# Patient Record
Sex: Male | Born: 1935
Health system: Southern US, Community
[De-identification: ages and names within clinical notes are randomized; demographics above are authoritative.]

## PROBLEM LIST (undated history)

## (undated) DIAGNOSIS — C439 Malignant melanoma of skin, unspecified: Secondary | ICD-10-CM

## (undated) DIAGNOSIS — C4491 Basal cell carcinoma of skin, unspecified: Secondary | ICD-10-CM

## (undated) DIAGNOSIS — R0602 Shortness of breath: Secondary | ICD-10-CM

## (undated) DIAGNOSIS — I10 Essential (primary) hypertension: Secondary | ICD-10-CM

## (undated) DIAGNOSIS — I2510 Atherosclerotic heart disease of native coronary artery without angina pectoris: Secondary | ICD-10-CM

## (undated) DIAGNOSIS — E785 Hyperlipidemia, unspecified: Secondary | ICD-10-CM

## (undated) HISTORY — DX: Atherosclerotic heart disease of native coronary artery without angina pectoris: I25.10

## (undated) HISTORY — DX: Shortness of breath: R06.02

## (undated) HISTORY — PX: CORONARY ARTERY BYPASS GRAFT: SHX141

## (undated) HISTORY — PX: APPENDECTOMY: SHX54

## (undated) HISTORY — DX: Essential (primary) hypertension: I10

## (undated) HISTORY — PX: KIDNEY STONE SURGERY: SHX686

## (undated) HISTORY — PX: REFRACTIVE SURGERY: SHX103

## (undated) HISTORY — DX: Hyperlipidemia, unspecified: E78.5

## (undated) NOTE — *Deleted (*Deleted)
Evaluation Performed:  Follow-up visit  Date:  08/19/2020   ID:  [name], DOB September 02, 1929, MRN [id number]  Provider Location: Office  PCP:  Lupita Raider, MD  Cardiologist:  No primary care provider on file. Damere Brandenburg Electrophysiologist:  None   Chief Complaint:  CAD/CABG  History of Present Illness:    HPI: 40 y.o. CAD/ CABG in 1999 last myovue 2018 no ischemia. Seen by PA in July 2019  for "syncope" thought to be vasovagal Holter done 05/24/18 no significant arrhythmia EF 48% on myovue June 2018  His syncope episodes are precipitated by pain in his jaw/teeth when Eating Denies TMJ or chronic dental issues.   No angina Unchanged chronic mild exertional dyspnea   Daughter checks in on him weekly and there is a cafe in [place] he can pick up food  Noted more edema and some weakness in left leg    ***   Past Medical History:  Diagnosis Date  . Basal cell carcinoma 06/02/2003   left bulb [id number]  . CAD   . HYPERLIPIDEMIA   . HYPERTENSION   . Melanoma (HCC) 06/05/2003   left cheek-MOHS  . Shortness of breath    Past Surgical History:  Procedure Laterality Date  . APPENDECTOMY    . CORONARY ARTERY BYPASS GRAFT    . KIDNEY STONE SURGERY    . REFRACTIVE SURGERY       No outpatient medications have been marked as taking for the 08/29/20 encounter (Appointment) with Wendall Stade, MD.     Allergies:   Succinylsulphathiazole and Sulfonamide derivatives   Social History   Tobacco Use  . Smoking status: Former Smoker    Packs/day: 2.00    Years: 40.00    Pack years: 80.00    Types: Cigarettes  . Smokeless tobacco: Never Used  Vaping Use  . Vaping Use: Never used  Substance Use Topics  . Alcohol use: No    Alcohol/week: 0.0 standard drinks    Comment: 0.5 glass of wine nightly.  . Drug use: No     Family Hx: The patient's family history includes Anuerysm in his father; Coronary artery disease in his father; Healthy in his daughter; Heart  attack in his brother.  ROS:   Please see the history of present illness.     All other systems reviewed and are negative.   Prior CV studies:   The following studies were reviewed today:  Holter 05/24/18 Myovue 05/04/17 Echo 11/18/11  Labs/Other Tests and Data Reviewed:    EKG:  SR LAD PVC nonspecific ST changes 05/24/18 10/30/19 SR rate 85 poor R wave progression   Recent Labs: No results found for requested labs within last 8760 hours.   Recent Lipid Panel Lab Results  Component Value Date/Time   CHOL 160 05/27/2010 12:00 PM   TRIG 96.0 05/27/2010 12:00 PM   HDL 47.30 05/27/2010 12:00 PM   CHOLHDL 3 05/27/2010 12:00 PM   LDLCALC 94 05/27/2010 12:00 PM    Wt Readings from Last 3 Encounters:  10/30/19 184 lb 3.2 oz (83.6 kg)  02/21/19 185 lb (83.9 kg)  06/15/18 178 lb 4 oz (80.9 kg)     Objective:    Vital Signs:  There were no vitals taken for this visit.   Affect appropriate Elderly kyphotic white male  HEENT: normal Neck supple with no adenopathy JVP normal no bruits no thyromegaly Lungs clear with no wheezing and good diaphragmatic motion Heart:  S1/S2 no murmur, no rub, gallop or  click PMI normal Abdomen: benighn, BS positve, no tenderness, no AAA no bruit.  No HSM or HJR Distal pulses intact with no bruits *** edema Neuro non-focal Skin warm and dry No muscular weakness   ASSESSMENT & PLAN:    ASSESSMENT AND PLAN:   1. Syncope: based on history surrounding event, suspect likely vasovagal syncope.  Holter 05/24/18 no significant arrhythmia Carotids 02/14/20 no significant stenosis   2. CAD: h/o CABG in 1999.  Myovue  04/2017 showed normal EF and no ischemia. He denies any CP and no dyspnea. Continue medical therapy.   3. Edema:  ***   COVID-19 Education: The signs and symptoms of COVID-19 were discussed with the patient and how to seek care for testing (follow up with PCP or arrange E-visit).  The importance of social distancing was discussed  today.  Time:   Today, I have spent 30 minutes with the patient     Medication Adjustments/Labs and Tests Ordered: Current medicines are reviewed at length with the patient today.  Concerns regarding medicines are outlined above.  Tests Ordered: No orders of the defined types were placed in this encounter.  Medication Changes: No orders of the defined types were placed in this encounter.   Disposition:  Follow up in 6 months  Signed, Charlton Haws, MD  08/19/2020 5:16 PM    [facility]

---
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
## 1998-07-24 ENCOUNTER — Inpatient Hospital Stay (HOSPITAL_COMMUNITY): Admission: AD | Admit: 1998-07-24 | Discharge: 1998-07-30 | Payer: Self-pay | Admitting: Cardiovascular Disease

## 1998-07-25 ENCOUNTER — Encounter: Payer: Self-pay | Admitting: Thoracic Surgery (Cardiothoracic Vascular Surgery)

## 1998-07-26 ENCOUNTER — Encounter: Payer: Self-pay | Admitting: Thoracic Surgery (Cardiothoracic Vascular Surgery)

## 1998-07-27 ENCOUNTER — Encounter: Payer: Self-pay | Admitting: Thoracic Surgery (Cardiothoracic Vascular Surgery)

## 1999-01-30 ENCOUNTER — Ambulatory Visit (HOSPITAL_COMMUNITY): Admission: RE | Admit: 1999-01-30 | Discharge: 1999-01-30 | Payer: Self-pay | Admitting: Neurosurgery

## 1999-01-30 ENCOUNTER — Encounter: Payer: Self-pay | Admitting: Neurosurgery

## 1999-02-06 ENCOUNTER — Encounter: Payer: Self-pay | Admitting: Neurosurgery

## 1999-02-06 ENCOUNTER — Ambulatory Visit (HOSPITAL_COMMUNITY): Admission: RE | Admit: 1999-02-06 | Discharge: 1999-02-06 | Payer: Self-pay | Admitting: Neurosurgery

## 1999-02-14 ENCOUNTER — Ambulatory Visit (HOSPITAL_COMMUNITY): Admission: RE | Admit: 1999-02-14 | Discharge: 1999-02-14 | Payer: Self-pay | Admitting: Neurosurgery

## 1999-02-14 ENCOUNTER — Encounter: Payer: Self-pay | Admitting: Neurosurgery

## 2000-05-06 ENCOUNTER — Encounter: Payer: Self-pay | Admitting: Neurosurgery

## 2000-05-06 ENCOUNTER — Encounter: Admission: RE | Admit: 2000-05-06 | Discharge: 2000-05-06 | Payer: Self-pay | Admitting: Neurosurgery

## 2000-12-17 ENCOUNTER — Encounter: Payer: Self-pay | Admitting: Neurosurgery

## 2000-12-17 ENCOUNTER — Ambulatory Visit (HOSPITAL_COMMUNITY): Admission: RE | Admit: 2000-12-17 | Discharge: 2000-12-17 | Payer: Self-pay | Admitting: Neurosurgery

## 2000-12-23 ENCOUNTER — Ambulatory Visit (HOSPITAL_COMMUNITY): Admission: RE | Admit: 2000-12-23 | Discharge: 2000-12-23 | Payer: Self-pay | Admitting: Radiology

## 2000-12-24 ENCOUNTER — Ambulatory Visit (HOSPITAL_COMMUNITY): Admission: RE | Admit: 2000-12-24 | Discharge: 2000-12-24 | Payer: Self-pay | Admitting: Neurosurgery

## 2000-12-24 ENCOUNTER — Encounter (INDEPENDENT_AMBULATORY_CARE_PROVIDER_SITE_OTHER): Payer: Self-pay | Admitting: Specialist

## 2000-12-24 ENCOUNTER — Encounter: Payer: Self-pay | Admitting: Neurosurgery

## 2001-05-05 ENCOUNTER — Encounter: Payer: Self-pay | Admitting: Neurosurgery

## 2001-05-05 ENCOUNTER — Ambulatory Visit (HOSPITAL_COMMUNITY): Admission: RE | Admit: 2001-05-05 | Discharge: 2001-05-05 | Payer: Self-pay | Admitting: Neurosurgery

## 2001-05-25 ENCOUNTER — Encounter: Payer: Self-pay | Admitting: Neurosurgery

## 2001-05-25 ENCOUNTER — Encounter: Admission: RE | Admit: 2001-05-25 | Discharge: 2001-05-25 | Payer: Self-pay | Admitting: Neurosurgery

## 2001-08-18 ENCOUNTER — Encounter: Payer: Self-pay | Admitting: Neurosurgery

## 2001-08-18 ENCOUNTER — Encounter: Admission: RE | Admit: 2001-08-18 | Discharge: 2001-08-18 | Payer: Self-pay | Admitting: Neurosurgery

## 2001-08-27 ENCOUNTER — Ambulatory Visit (HOSPITAL_COMMUNITY): Admission: RE | Admit: 2001-08-27 | Discharge: 2001-08-27 | Payer: Self-pay | Admitting: Family Medicine

## 2001-08-27 ENCOUNTER — Encounter: Payer: Self-pay | Admitting: Neurosurgery

## 2001-09-15 ENCOUNTER — Encounter: Payer: Self-pay | Admitting: Neurosurgery

## 2001-09-15 ENCOUNTER — Encounter: Admission: RE | Admit: 2001-09-15 | Discharge: 2001-09-15 | Payer: Self-pay | Admitting: Neurosurgery

## 2003-02-19 ENCOUNTER — Ambulatory Visit (HOSPITAL_COMMUNITY): Admission: RE | Admit: 2003-02-19 | Discharge: 2003-02-19 | Payer: Self-pay | Admitting: Gastroenterology

## 2003-06-02 DIAGNOSIS — C4491 Basal cell carcinoma of skin, unspecified: Secondary | ICD-10-CM

## 2003-06-02 HISTORY — DX: Basal cell carcinoma of skin, unspecified: C44.91

## 2003-06-05 DIAGNOSIS — C439 Malignant melanoma of skin, unspecified: Secondary | ICD-10-CM

## 2003-06-05 HISTORY — DX: Malignant melanoma of skin, unspecified: C43.9

## 2005-02-02 ENCOUNTER — Ambulatory Visit: Payer: Self-pay | Admitting: Cardiovascular Disease

## 2005-08-28 ENCOUNTER — Ambulatory Visit: Payer: Self-pay | Admitting: Cardiovascular Disease

## 2005-11-18 ENCOUNTER — Ambulatory Visit (HOSPITAL_COMMUNITY): Admission: RE | Admit: 2005-11-18 | Discharge: 2005-11-18 | Payer: Self-pay | Admitting: Ophthalmology

## 2005-12-08 ENCOUNTER — Ambulatory Visit (HOSPITAL_COMMUNITY): Admission: RE | Admit: 2005-12-08 | Discharge: 2005-12-08 | Payer: Self-pay | Admitting: Ophthalmology

## 2006-01-05 ENCOUNTER — Ambulatory Visit (HOSPITAL_COMMUNITY): Admission: RE | Admit: 2006-01-05 | Discharge: 2006-01-05 | Payer: Self-pay | Admitting: Ophthalmology

## 2006-07-09 ENCOUNTER — Ambulatory Visit: Payer: Self-pay | Admitting: Cardiovascular Disease

## 2006-07-14 ENCOUNTER — Ambulatory Visit (HOSPITAL_COMMUNITY): Admission: RE | Admit: 2006-07-14 | Discharge: 2006-07-14 | Payer: Self-pay | Admitting: Ophthalmology

## 2006-07-26 ENCOUNTER — Encounter: Payer: Self-pay | Admitting: Cardiovascular Disease

## 2006-07-26 ENCOUNTER — Ambulatory Visit: Payer: Self-pay

## 2007-01-21 ENCOUNTER — Ambulatory Visit: Payer: Self-pay | Admitting: Cardiovascular Disease

## 2007-07-15 ENCOUNTER — Ambulatory Visit: Payer: Self-pay | Admitting: Cardiovascular Disease

## 2007-07-25 ENCOUNTER — Ambulatory Visit: Payer: Self-pay

## 2007-07-27 ENCOUNTER — Ambulatory Visit (HOSPITAL_COMMUNITY): Admission: RE | Admit: 2007-07-27 | Discharge: 2007-07-27 | Payer: Self-pay | Admitting: Ophthalmology

## 2007-09-12 ENCOUNTER — Ambulatory Visit: Payer: Self-pay | Admitting: Pulmonary Disease

## 2008-03-14 ENCOUNTER — Ambulatory Visit: Payer: Self-pay | Admitting: Family Medicine

## 2008-08-23 ENCOUNTER — Ambulatory Visit: Payer: Self-pay | Admitting: Family Medicine

## 2009-01-04 ENCOUNTER — Encounter: Admission: RE | Admit: 2009-01-04 | Discharge: 2009-01-04 | Payer: Self-pay | Admitting: Family Medicine

## 2009-01-04 ENCOUNTER — Encounter: Admission: RE | Admit: 2009-01-04 | Discharge: 2009-01-04 | Payer: Self-pay | Admitting: Neurosurgery

## 2009-02-20 ENCOUNTER — Telehealth: Payer: Self-pay | Admitting: Cardiovascular Disease

## 2009-07-03 ENCOUNTER — Ambulatory Visit (HOSPITAL_COMMUNITY): Admission: RE | Admit: 2009-07-03 | Discharge: 2009-07-03 | Payer: Self-pay | Admitting: Ophthalmology

## 2009-07-26 ENCOUNTER — Encounter: Admission: RE | Admit: 2009-07-26 | Discharge: 2009-07-26 | Payer: Self-pay | Admitting: Family Medicine

## 2009-07-26 ENCOUNTER — Encounter: Payer: Self-pay | Admitting: Cardiovascular Disease

## 2009-11-12 DIAGNOSIS — I10 Essential (primary) hypertension: Secondary | ICD-10-CM | POA: Insufficient documentation

## 2009-11-13 ENCOUNTER — Ambulatory Visit: Payer: Self-pay | Admitting: Cardiovascular Disease

## 2009-11-13 DIAGNOSIS — R0602 Shortness of breath: Secondary | ICD-10-CM | POA: Insufficient documentation

## 2009-11-13 DIAGNOSIS — I2510 Atherosclerotic heart disease of native coronary artery without angina pectoris: Secondary | ICD-10-CM | POA: Insufficient documentation

## 2009-11-26 ENCOUNTER — Ambulatory Visit: Payer: Self-pay | Admitting: Cardiology

## 2009-11-26 ENCOUNTER — Ambulatory Visit: Payer: Self-pay

## 2009-11-26 ENCOUNTER — Ambulatory Visit (HOSPITAL_COMMUNITY): Admission: RE | Admit: 2009-11-26 | Discharge: 2009-11-26 | Payer: Self-pay | Admitting: Cardiovascular Disease

## 2009-11-26 ENCOUNTER — Encounter: Payer: Self-pay | Admitting: Cardiovascular Disease

## 2009-12-26 ENCOUNTER — Telehealth: Payer: Self-pay | Admitting: Cardiovascular Disease

## 2010-02-12 ENCOUNTER — Encounter: Payer: Self-pay | Admitting: Cardiovascular Disease

## 2010-03-12 ENCOUNTER — Encounter: Payer: Self-pay | Admitting: Cardiovascular Disease

## 2010-03-27 ENCOUNTER — Encounter (INDEPENDENT_AMBULATORY_CARE_PROVIDER_SITE_OTHER): Payer: Self-pay | Admitting: *Deleted

## 2010-05-27 ENCOUNTER — Ambulatory Visit: Payer: Self-pay | Admitting: Cardiovascular Disease

## 2010-05-27 DIAGNOSIS — E785 Hyperlipidemia, unspecified: Secondary | ICD-10-CM | POA: Insufficient documentation

## 2010-05-28 ENCOUNTER — Telehealth: Payer: Self-pay | Admitting: Cardiovascular Disease

## 2010-05-28 LAB — CONVERTED CEMR LAB
ALT: 22 units/L (ref 0–53)
AST: 32 units/L (ref 0–37)
Albumin: 4.2 g/dL (ref 3.5–5.2)
Alkaline Phosphatase: 38 units/L — ABNORMAL LOW (ref 39–117)
Cholesterol: 160 mg/dL (ref 0–200)
LDL Cholesterol: 94 mg/dL (ref 0–99)
Total Bilirubin: 1 mg/dL (ref 0.3–1.2)
VLDL: 19.2 mg/dL (ref 0.0–40.0)

## 2010-11-25 ENCOUNTER — Encounter: Payer: Self-pay | Admitting: Cardiovascular Disease

## 2010-11-25 ENCOUNTER — Ambulatory Visit
Admission: RE | Admit: 2010-11-25 | Discharge: 2010-11-25 | Payer: Self-pay | Source: Home / Self Care | Attending: Cardiovascular Disease | Admitting: Cardiovascular Disease

## 2010-12-09 NOTE — Letter (Signed)
Summary: Appointment - Reminder 2  [facility], Main Office  [address]   [place], [place] [address]   Phone: [phone]  Fax: [phone]     Mar 27, 2010 MRN: [id number]   [name] [address] [place], [place]  [address]   Dear [name],  Our records indicate that it is time to schedule a follow-up appointment with Dr. Eden Emms. It is very important that we reach you to schedule this appointment. We look forward to participating in your health care needs. Please contact us at the number listed above at your earliest convenience to schedule your appointment.  If you are unable to make an appointment at this time, give [place] a call so we can update our records.     Sincerely,   [facility] Scheduling Team

## 2010-12-09 NOTE — Progress Notes (Signed)
Summary: Test results   Phone Note Call from Patient Call back at Home Phone [phone]   Caller: Patient Summary of Call: Test  results Initial call taken by: Judie Grieve,  May 28, 2010 1:52 PM  Follow-up for Phone Call        Pt want lab results Judie Grieve  May 28, 2010 4:54 PM  Additional Follow-up for Phone Call Additional follow up Details #1::        [name] is aware of lab results and will stop Zetia. Lisabeth Devoid RN

## 2010-12-09 NOTE — Assessment & Plan Note (Signed)
Summary: 6 month rov/sl  Medications Added MULTIVITAMINS   TABS (MULTIPLE VITAMIN) 1 tab by mouth once daily SUPER B COMPLEX  TABS (B COMPLEX-C) 1  tab by mouth once daily VITAMIN C 500 MG TABS (ASCORBIC ACID) 1 tab by mouth once daily VITAMIN D 1000 UNIT TABS (CHOLECALCIFEROL) 1  tab by mouth once daily VITAMIN E 400 UNIT CAPS (VITAMIN E) 1 tab by mouth once daily FISH OIL   OIL (FISH OIL) 1 tab by mouth once daily        Primary Provider:  dr Clelia Croft  CC:  sob.  History of Present Illness: Chantz is seen today for F/U CAD with CABG in 1999.  He had a normal myovue in 2008.  He denies SSCP or angina.  He still lives alone in [place].  His daughter and son in law look in on him frequently and take good care of him.  He still seems depressed since his wife died a few years ago.  He decided not to get another dog since "lucky" died.  He has had some wheezing and dyspnea.  Last time I saw him with this complaint he had allergies and some bronchospastic disease.  Inhalers helped and he needs to restart this.  His primary Doctor Clelia Croft has done a CXR on him recently and it was "ok".  He also gets some dyspnea on bending over which is funcitonal.  His son in law Roland Rack was with him today.  I have seen him before in 2008 for atypical pain and had normal ETT and echo  He needs blood work.  He indicates seeing Dr Clelia Croft at [place].  he would like to stop Zetia due to cost  Current Medications (verified): 1)  Ramipril 2.5 Mg Caps (Ramipril) .Marland Kitchen.. 1 Tab By Mouth Once Daily 2)  Multivitamins   Tabs (Multiple Vitamin) .Marland Kitchen.. 1 Tab By Mouth Once Daily 3)  Super B Complex  Tabs (B Complex-C) .Marland Kitchen.. 1  Tab By Mouth Once Daily 4)  Vitamin C 500 Mg Tabs (Ascorbic Acid) .Marland Kitchen.. 1 Tab By Mouth Once Daily 5)  Aspirin 81 Mg Tbec (Aspirin) .... Take One Tablet By Mouth Daily 6)  Vitamin D 1000 Unit Tabs (Cholecalciferol) .Marland Kitchen.. 1  Tab By Mouth Once Daily 7)  Vitamin E 400 Unit Caps (Vitamin E) .Marland Kitchen.. 1 Tab By Mouth Once  Daily 8)  Pravastatin Sodium 40 Mg Tabs (Pravastatin Sodium) .... Take One Tablet By Mouth Daily At Bedtime 9)  Alendronate Sodium 70 Mg Tabs (Alendronate Sodium) .Marland Kitchen.. 1 Tab Q Weekly 10)  Zantac 75 75 Mg Tabs (Ranitidine Hcl) .Marland Kitchen.. 1 Tab Once Daily 11)  Zetia 10 Mg Tabs (Ezetimibe) .... Take One Tablet By Mouth Daily. 12)  Proair Hfa 108 (90 Base) Mcg/act Aers (Albuterol Sulfate) .Marland Kitchen.. 1-2 Puffs Every 4-6 Hours As Needed 13)  Fish Oil   Oil (Fish Oil) .Marland Kitchen.. 1 Tab By Mouth Once Daily  Allergies: 1)  ! Sulfa  Vital Signs:  Patient profile:   77 year old male Height:      66 inches Weight:      191 pounds BMI:     30.94 Pulse rate:   82 / minute Resp:     14 per minute BP sitting:   130 / 80  (left arm)  Vitals Entered By: Kem Parkinson (May 27, 2010 11:44 AM)  Physical Exam  General:  Affect appropriate Healthy:  appears stated age HEENT: normal Neck supple with no adenopathy JVP normal no  bruits no thyromegaly Lungs clear with no wheezing and good diaphragmatic motion Heart:  S1/S2 no murmur,rub, gallop or click PMI normal Abdomen: benighn, BS positve, no tenderness, no AAA no bruit.  No HSM or HJR Distal pulses intact with no bruits No edema Neuro non-focal Skin warm and dry    Impression & Recommendations:  Problem # 1:  CAD (ICD-414.00)  Stable no angina His updated medication list for this problem includes:    Ramipril 2.5 Mg Caps (Ramipril) .Marland Kitchen... 1 tab by mouth once daily    Aspirin 81 Mg Tbec (Aspirin) .Marland Kitchen... Take one tablet by mouth daily  Orders: TLB-Hepatic/Liver Function Pnl (80076-HEPATIC) TLB-Lipid Panel (80061-LIPID)  Problem # 2:  SHORTNESS OF BREATH (ICD-786.05) Improved with EF 50-55% by echo and only mild MR His updated medication list for this problem includes:    Ramipril 2.5 Mg Caps (Ramipril) .Marland Kitchen... 1 tab by mouth once daily    Aspirin 81 Mg Tbec (Aspirin) .Marland Kitchen... Take one tablet by mouth daily  Problem # 3:  HYPERTENSION (ICD-401.9) Well  controlled  His updated medication list for this problem includes:    Ramipril 2.5 Mg Caps (Ramipril) .Marland Kitchen... 1 tab by mouth once daily    Aspirin 81 Mg Tbec (Aspirin) .Marland Kitchen... Take one tablet by mouth daily  His updated medication list for this problem includes:    Ramipril 2.5 Mg Caps (Ramipril) .Marland Kitchen... 1 tab by mouth once daily    Aspirin 81 Mg Tbec (Aspirin) .Marland Kitchen... Take one tablet by mouth daily  Problem # 4:  HYPERLIPIDEMIA (ICD-272.4) LFT's normal 4/11  need lipids to make decision on stopping zetia due to expense His updated medication list for this problem includes:    Pravastatin Sodium 40 Mg Tabs (Pravastatin sodium) .Marland Kitchen... Take one tablet by mouth daily at bedtime    Zetia 10 Mg Tabs (Ezetimibe) .Marland Kitchen... Take one tablet by mouth daily.  Orders: TLB-Hepatic/Liver Function Pnl (80076-HEPATIC) TLB-Lipid Panel (80061-LIPID)  Patient Instructions: 1)  Your physician recommends that you schedule a follow-up appointment in: 6 months with Dr. Eden Emms 2)  Your physician recommends that you  have  lab work  today  lipid, liver

## 2010-12-09 NOTE — Progress Notes (Signed)
Summary: Zetia samples   Phone Note Call from Patient   Caller: Patient Summary of Call: Pt. calling to request samples of Zetia.  Told pt I would check to see if we had any and call him back. Initial call taken by: Dossie Arbour, RN, BSN,  December 26, 2009 1:23 PM  Follow-up for Phone Call        PT AWARE SAMPLES OF [facility] LEFT AT [facility] LOT #[id number] EXP 4/14  # 35 Follow-up by: Scherrie Bateman, LPN,  December 26, 2009 3:32 PM

## 2010-12-09 NOTE — Assessment & Plan Note (Signed)
Summary: f1y/jss  Medications Added * MULTI VIT 1 tab once daily * SUPER B COMPLEX 1 tab once daily * VIT C 500mg  1 tab once daily ASPIRIN 81 MG TBEC (ASPIRIN) Take one tablet by mouth daily * VIT D 3 2000IU 1 tab once daily * VIT E 200 IU 1 tab once daily PRAVASTATIN SODIUM 40 MG TABS (PRAVASTATIN SODIUM) Take one tablet by mouth daily at bedtime ALENDRONATE SODIUM 70 MG TABS (ALENDRONATE SODIUM) 1 tab q weekly ZANTAC 75 75 MG TABS (RANITIDINE HCL) 1 tab once daily ZETIA 10 MG TABS (EZETIMIBE) Take one tablet by mouth daily. PROAIR HFA 108 (90 BASE) MCG/ACT AERS (ALBUTEROL SULFATE) 1-2 PUFFS EVERY 4-6 HOURS AS NEEDED      Allergies Added: ! SULFA  Visit Type:  Follow-up Primary Provider:  dr Clelia Croft  CC:  sob.  History of Present Illness: [name] is seen today for F/U CAD with CABG in 1999.  He had a normal myovue in 2008.  He denies SSCP or angina.  He still lives alone in [place].  His daughter and son in law look in on him frequently and take good care of him.  He still seems depressed since his wife died a few years ago.  He decided not to get another dog since "lucky" died.  He has had some wheezing and dyspnea.  Last time I saw him with this complaint he had allergies and some bronchospastic disease.  Inhalers helped and he needs to restart this.  His primary Doctor Clelia Croft has done a CXR on him recently and it was "ok".  He also gets some dyspnea on bending over which is funcitonal.  His son in law Roland Rack was with him today.  I have seen him before in 2008 for atypical pain and had normal ETT and echo  Current Problems (verified): 1)  Shortness of Breath  (ICD-786.05) 2)  Hypertension  (ICD-401.9)  Current Medications (verified): 1)  Ramipril 2.5 Mg Caps (Ramipril) .Marland Kitchen.. 1 Tab By Mouth Once Daily 2)  Multi Vit .Marland Kitchen.. 1 Tab Once Daily 3)  Super B Complex .Marland Kitchen.. 1 Tab Once Daily 4)  Vit C .... 500mg  1 Tab Once Daily 5)  Aspirin 81 Mg Tbec (Aspirin) .... Take One Tablet By Mouth  Daily 6)  Vit D 3 2000iu .Marland Kitchen.. 1 Tab Once Daily 7)  Vit E 200 Iu .Marland Kitchen.. 1 Tab Once Daily 8)  Pravastatin Sodium 40 Mg Tabs (Pravastatin Sodium) .... Take One Tablet By Mouth Daily At Bedtime 9)  Alendronate Sodium 70 Mg Tabs (Alendronate Sodium) .Marland Kitchen.. 1 Tab Q Weekly 10)  Zantac 75 75 Mg Tabs (Ranitidine Hcl) .Marland Kitchen.. 1 Tab Once Daily 11)  Zetia 10 Mg Tabs (Ezetimibe) .... Take One Tablet By Mouth Daily. 12)  Proair Hfa 108 (90 Base) Mcg/act Aers (Albuterol Sulfate) .Marland Kitchen.. 1-2 Puffs Every 4-6 Hours As Needed  Allergies (verified): 1)  ! Sulfa  Past History:  Past Medical History: Last updated: 11/12/2009 CABG 1999 normal myovue 07/2007 HYPERTENSION (ICD-401.9) Hyperlipidemia Asthma  Past Surgical History: Last updated: 11/12/2009   Repair of complex retinal detachment on the right eye via vitrectomy, 25-gauge; gas fluid exchange; endolaser retinopexy; membrane  peel; injection of silicone oil, right eye, 5000 [place]   coronary artery bypass surgery in 1999. He had  a LIMA to the LAD, vein graft to the diagonal, vein graft sequentially  to the obtuse marginal branch I and II, and a vein graft to the PDA.   appendectomy  laser  surgery  kidney stones  Family History: Last updated: 11/12/2009 Grandfatther: CAD  Social History: Last updated: 11/13/2009 Tobacco Use - No.  Alcohol Use - no Widowed Good family support from daughter Lupita Leash and Ann Maki in law Casimiro Needle [place]  Social History: Tobacco Use - No.  Alcohol Use - no Widowed Good family support from daughter Lupita Leash and Ann Maki in law Casimiro Needle [place]  Review of Systems       Denies fever, malais, weight loss, blurry vision, decreased visual acuity, cough, sputum,  hemoptysis, pleuritic pain, palpitaitons, heartburn, abdominal pain, melena, lower extremity edema, claudication, or rash.   Vital Signs:  Patient profile:   77 year old male Height:      66 inches Weight:      190 pounds BMI:     30.78 Pulse rate:   90 /  minute BP sitting:   119 / 72  (left arm) Cuff size:   regular  Vitals Entered By: Burnett Kanaris, CNA (November 13, 2009 12:33 PM)  Physical Exam  General:  Affect appropriate Healthy:  appears stated age HEENT: normal Neck supple with no adenopathy JVP normal no bruits no thyromegaly Lungs clear with no wheezing and good diaphragmatic motion Heart:  S1/S2 no murmur,rub, gallop or click PMI normal Abdomen: benighn, BS positve, no tenderness, no AAA no bruit.  No HSM or HJR Distal pulses intact with no bruits No edema Neuro non-focal Skin warm and dry    Impression & Recommendations:  Problem # 1:  SHORTNESS OF BREATH (ICD-786.05) Echo.  Doubt cardiac etiology.  Renew script for inhaler ProAir  2 puffs as needed His updated medication list for this problem includes:    Ramipril 2.5 Mg Caps (Ramipril) .Marland Kitchen... 1 tab by mouth once daily    Aspirin 81 Mg Tbec (Aspirin) .Marland Kitchen... Take one tablet by mouth daily  Orders: EKG w/ Interpretation (93000) Echocardiogram (Echo)  Problem # 2:  HYPERTENSION (ICD-401.9) Well controlled continue low sodium diet His updated medication list for this problem includes:    Ramipril 2.5 Mg Caps (Ramipril) .Marland Kitchen... 1 tab by mouth once daily    Aspirin 81 Mg Tbec (Aspirin) .Marland Kitchen... Take one tablet by mouth daily  Problem # 3:  CAD (ICD-414.00) CABG in 1999 with non ischemic myovue in 2008.  Medical Rx His updated medication list for this problem includes:    Ramipril 2.5 Mg Caps (Ramipril) .Marland Kitchen... 1 tab by mouth once daily    Aspirin 81 Mg Tbec (Aspirin) .Marland Kitchen... Take one tablet by mouth daily  Patient Instructions: 1)  Your physician recommends that you schedule a follow-up appointment in: 6 MONTHS WITH DR Eden Emms DUE JULY 2011 2)  Your physician recommends that you continue on your current medications as directed. Please refer to the Current Medication list given to you today. 3)  Your physician has requested that you have an echocardiogram.   Echocardiography is a painless test that uses sound waves to create images of your heart. It provides your doctor with information about the size and shape of your heart and how well your heart's chambers and valves are working.  This procedure takes approximately one hour. There are no restrictions for this procedure. Prescriptions: PROAIR HFA 108 (90 BASE) MCG/ACT AERS (ALBUTEROL SULFATE) 1-2 PUFFS EVERY 4-6 HOURS AS NEEDED  #1 x 2   Entered by:   Scherrie Bateman, LPN   Authorized by:   Colon Branch, MD, [facility]   Signed by:   Scherrie Bateman, LPN on 30/86/5784  Method used:   Electronically to        [id number] (retail)       [address]       [place], [place]  [address]       Ph: [phone]       Fax: [phone]   RxID:   [id number]    EKG Report  Procedure date:  11/13/2009  Findings:      NSR 90 Occ PVC Old IMI Poor R wave progression Abnormal ECG

## 2010-12-09 NOTE — Letter (Signed)
Summary: Walk-In Patient Form  Walk-In Patient Form   Imported By: Marylou Mccoy 04/15/2010 11:54:43  _____________________________________________________________________  External Attachment:    Type:   Image     Comment:   External Document

## 2010-12-09 NOTE — Consult Note (Signed)
Summary: [name]   Imported By: Marylou Mccoy 11/14/2009 10:00:10  _____________________________________________________________________  External Attachment:    Type:   Image     Comment:   External Document

## 2010-12-11 NOTE — Assessment & Plan Note (Signed)
Summary: f67m/dfg      Allergies Added:   Primary Provider:  dr Clelia Croft  CC:  sob and wheezing.Marland Kitchenalso lower  back pain.  History of Present Illness: [name] is seen today for F/U CAD with CABG in 1999.  He had a normal myovue in 2008.  He denies SSCP or angina.  He still lives alone in [place].  His daughter and son in law look in on him frequently and take good care of him.  He still seems depressed since his wife died a few years ago.  He decided not to get another dog since "lucky" died.  He has had some wheezing and dyspnea.  Last time I saw him with this complaint he had allergies and some bronchospastic disease.  Inhalers helped and he needs to restart this.  His primary Doctor Clelia Croft has done a CXR on him recently and it was "ok".  He also gets some dyspnea on bending over which is funcitonal.  His son in law Roland Rack was with him today.  I have seen him before in 2008 for atypical pain and had normal ETT and echo  Current Problems (verified): 1)  Hyperlipidemia  (ICD-272.4) 2)  Cad  (ICD-414.00) 3)  Shortness of Breath  (ICD-786.05) 4)  Hypertension  (ICD-401.9)  Current Medications (verified): 1)  Ramipril 2.5 Mg Caps (Ramipril) .Marland Kitchen.. 1 Tab By Mouth Once Daily 2)  Multivitamins   Tabs (Multiple Vitamin) .Marland Kitchen.. 1 Tab By Mouth Once Daily 3)  Super B Complex  Tabs (B Complex-C) .Marland Kitchen.. 1  Tab By Mouth Once Daily 4)  Vitamin C 500 Mg Tabs (Ascorbic Acid) .Marland Kitchen.. 1 Tab By Mouth Once Daily 5)  Aspirin 81 Mg Tbec (Aspirin) .... Take One Tablet By Mouth Daily 6)  Vitamin D 1000 Unit Tabs (Cholecalciferol) .Marland Kitchen.. 1  Tab By Mouth Once Daily 7)  Pravastatin Sodium 40 Mg Tabs (Pravastatin Sodium) .... Take One Tablet By Mouth Daily At Bedtime 8)  Alendronate Sodium 70 Mg Tabs (Alendronate Sodium) .Marland Kitchen.. 1 Tab Q Weekly 9)  Zantac 75 75 Mg Tabs (Ranitidine Hcl) .Marland Kitchen.. 1 Tab Once Daily 10)  Proair Hfa 108 (90 Base) Mcg/act Aers (Albuterol Sulfate) .Marland Kitchen.. 1-2 Puffs Every 4-6 Hours As Needed 11)  Fish Oil   Oil  (Fish Oil) .Marland Kitchen.. 1 Tab By Mouth Once Daily  Allergies (verified): 1)  ! Sulfa   Past History:  Past Medical History: Last updated: 11/12/2009 CABG 1999 normal myovue 07/2007 HYPERTENSION (ICD-401.9) Hyperlipidemia Asthma  Past Surgical History: Last updated: 11/12/2009   Repair of complex retinal detachment on the right eye via vitrectomy, 25-gauge; gas fluid exchange; endolaser retinopexy; membrane  peel; injection of silicone oil, right eye, 5000 Centistokes   coronary artery bypass surgery in 1999. He had  a LIMA to the LAD, vein graft to the diagonal, vein graft sequentially  to the obtuse marginal branch I and II, and a vein graft to the PDA.   appendectomy  laser surgery  kidney stones  Family History: Last updated: 11/12/2009 Grandfatther: CAD  Social History: Last updated: 11/13/2009 Tobacco Use - No.  Alcohol Use - no Widowed Good family support from daughter Lupita Leash and Ann Maki in law Michael [place]  Review of Systems       Denies fever, malais, weight loss, blurry vision, decreased visual acuity, cough, sputum, , hemoptysis, pleuritic pain, palpitaitons, heartburn, abdominal pain, melena, lower extremity edema, claudication, or rash.   Vital Signs:  Patient profile:   77 year old male Height:  66 inches Weight:      193 pounds BMI:     31.26 Pulse rate:   72 / minute Resp:     14 per minute BP sitting:   130 / 82  (right arm)  Vitals Entered By: Kem Parkinson (November 25, 2010 2:24 PM)  Physical Exam  General:  Affect appropriate Healthy:  appears stated age HEENT: normal Neck supple with no adenopathy JVP normal no bruits no thyromegaly Lungs clear with no wheezing and good diaphragmatic motion Heart:  S1/S2 no murmur,rub, gallop or click PMI normal Abdomen: benighn, BS positve, no tenderness, no AAA no bruit.  No HSM or HJR Distal pulses intact with no bruits No edema Neuro non-focal Skin warm and dry Ventral hernia with old  abdominal scar from colon surgery and Sternotomy   Impression & Recommendations:  Problem # 1:  HYPERLIPIDEMIA (ICD-272.4) Continue statin  Labs per primary The following medications were removed from the medication list:    Zetia 10 Mg Tabs (Ezetimibe) .Marland Kitchen... Take one tablet by mouth daily. His updated medication list for this problem includes:    Pravastatin Sodium 40 Mg Tabs (Pravastatin sodium) .Marland Kitchen... Take one tablet by mouth daily at bedtime  Problem # 2:  CAD (ICD-414.00) Stable no angina  Last myovue 2008 His updated medication list for this problem includes:    Ramipril 2.5 Mg Caps (Ramipril) .Marland Kitchen... 1 tab by mouth once daily    Aspirin 81 Mg Tbec (Aspirin) .Marland Kitchen... Take one tablet by mouth daily  Problem # 3:  SHORTNESS OF BREATH (ICD-786.05) Asthma and COPD continue inhalers F/U primary His updated medication list for this problem includes:    Ramipril 2.5 Mg Caps (Ramipril) .Marland Kitchen... 1 tab by mouth once daily    Aspirin 81 Mg Tbec (Aspirin) .Marland Kitchen... Take one tablet by mouth daily  Problem # 4:  HYPERTENSION (ICD-401.9) Well contorlled His updated medication list for this problem includes:    Ramipril 2.5 Mg Caps (Ramipril) .Marland Kitchen... 1 tab by mouth once daily    Aspirin 81 Mg Tbec (Aspirin) .Marland Kitchen... Take one tablet by mouth daily  Patient Instructions: 1)  Your physician wants you to follow-up in: one year  You will receive a reminder letter in the mail two months in advance. If you don't receive a letter, please call our office to schedule the follow-up appointment.

## 2011-02-14 LAB — CBC
HCT: 45.3 % (ref 39.0–52.0)
Hemoglobin: 15.3 g/dL (ref 13.0–17.0)
RBC: 4.8 MIL/uL (ref 4.22–5.81)
WBC: 7.4 10*3/uL (ref 4.0–10.5)

## 2011-02-14 LAB — BASIC METABOLIC PANEL
Calcium: 9.6 mg/dL (ref 8.4–10.5)
GFR calc Af Amer: >60 mL/min (ref >60)
GFR calc non Af Amer: >60 mL/min (ref >60)
Glucose, Bld: 106 mg/dL — ABNORMAL HIGH (ref 70–99)
Potassium: 3.9 mEq/L (ref 3.5–5.1)
Sodium: 140 mEq/L (ref 135–145)

## 2011-03-24 NOTE — Op Note (Signed)
NAMEJAYVIN, [name]              ACCOUNT NO.:  [id number]   MEDICAL RECORD NO.:  [id number]          PATIENT TYPE:  AMB   LOCATION:  SDS                          FACILITY:  [facility]   PHYSICIAN:  Alford Highland. Rankin, M.D.   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   PROCEDURES:  Repair of complex retinal detachment on the right eye via  vitrectomy, 25-gauge; gas fluid exchange; endolaser retinopexy; membrane  peel; injection of silicone oil, right eye, 5000 Centistokes.   SURGEON:  Alford Highland. Rankin, MD   ANESTHESIA:  Local retrobulbar with anesthesia control.   INDICATION FOR PROCEDURE:  The patient is a 77 year old man who has had  previous multiple retinal surgeries with successful reattachment of the  retina and subsequent removal.  Has had longstanding retinal  reattachment and had regained use of approximately 20/100 vision in the  right eye, which is ambulatory.  He developed a late onset of recurrent  retinal detachment secondary to low-grade proliferative  vitreoretinopathy.  This is an attempt, the patient and the family  understand to simply reattach the retina so as to allow for regaining  ambulatory vision, so that this eye can be used as a spare tire,  should it ever be in needed for ambulatory reasons in the future.  The  patient's and family understand the risks of anesthesia including the  rare occurrence of death, loss of the eye including, but not limited to  hemorrhage, infection, scarring, need for another surgery, change in  vision,  loss of vision, progression of disease despite intervention.   DESCRIPTION OF PROCEDURE:  Appropriate signed consent was obtained.  The  patient was taken to the operating room.  In the operating room,  appropriate monitors followed by mild sedation.  2% Xylocaine 5 mL  injected retrobulbar with additional 5 mL laterally in the fashion of  modified Darel Hong.  Right periocular region was sterilely prepped and  draped in the usual sterile fashion.  Lid speculum was applied.  A 25-  gauge infusion trocar was placed over the infratemporal quadrant.  Infusion was then turned on and superior trocars were applied.  Previous  vitrectomy had been done.  No modifiable membranes were identified.  Excellent mobilization of the retina was identified.  Fluid-fluid  exchange carried out with thick subretinal fluid.  Thereafter, fluid-air  exchange completed.  Retina reattached nicely.  Multiple reaspiration of  reaccumulated fluid was carried out.  Endolaser photocoagulation was  placed in the bed of the detachment as well as around the old retinotomy  site inferotemporally, which was found.  Drainage of subretinal fluid  was carried out.  Laser photocoagulation was placed posterior to  previous chorioretinal scars at the equator at 360 degrees and slightly  posteriorly inferior.  The retina reattached nicely.  Superonasal  sclerotomy was closed with 7-0 Vicryl suture.  The fluid exchange had  been completed by this time and now a passive exchange of air for  silicone oil 5000 Centistokes was then carried out passively.  This was  carried out through the superotemporal sclerotomy, enlarged with a 20-  gauge MVR blade.  Oil fill was obtained to the posterior iris plane.  At  this time, the superotemporal sclerotomy was closed with 7-0 Vicryl  suture.  Conjunctiva was closed  over this.  Infusion was then removed and closed with 7-0 Vicryl suture.  Subconjunctival Decadron applied.  Sterile patch and Fox  shield applied.  Intraocular pressure had been assessed and found to be adequate.  The  patient was taken to the short stay area and discharged home as an  outpatient.      Alford Highland Rankin, M.D.  Electronically Signed     GAR/MEDQ  D:  07/03/2009  T:  07/04/2009  Job:  [id number]

## 2011-03-24 NOTE — Op Note (Signed)
[name], [name]              ACCOUNT NO.:  [id number]   MEDICAL RECORD NO.:  [id number]          PATIENT TYPE:  AMB   LOCATION:  SDS                          FACILITY:  [facility]   PHYSICIAN:  Alford Highland. Rankin, M.D.   DATE OF BIRTH:  Dec 15, 1928   DATE OF PROCEDURE:  07/27/2007  DATE OF DISCHARGE:                               OPERATIVE REPORT   PREOPERATIVE DIAGNOSES:  1. Proliferative vitreal retinopathy status post complex retinal      detachment repair, right eye.  2. Retained silicone oil implant, right eye - nonmagnetic foreign      body.   POSTOPERATIVE DIAGNOSES:  1. Proliferative vitreal retinopathy status post complex retinal      detachment repair, right eye.  2. Retained silicone oil implant, right eye - nonmagnetic foreign      body.   PROCEDURES:  1. Posterior vitrectomy with endolaser panphotocoagulation for      retinopexy purposes - 25 gauge, right eye.  2. Removal of silicone oil implant - nonmagnetic intraocular foreign      body, right eye.   SURGEON:  Alford Highland. Rankin, M.D.   ANESTHESIA:  Local retrobulbar anesthesia control.   INDICATIONS FOR PROCEDURE:  The patient is a 77 year old man who has  significant vision loss on the basis of chronic recurrent retinal  detachments.  This was an attempt to now remove the permanent silicone  oil so that he can maximize his visual function.   The patient understands this is an attempt to remove the oil so that he  has maximizing in his vision, but he also understands there is an 8%  chance for redetachment.  He understands the risks of anesthesia, remote  occurrence of death, loss of the eye from the condition as well as the  surgery including but not limited to hemorrhage, infection, scarring,  need for further surgery, no change in vision, loss of vision,  progression of disease despite surgical intervention.  Appropriate  signed consent was obtained, patient taken to the operating room.   In the operating  room, appropriate monitors followed by mild sedation.  Marcaine 0.75% was delivered, 5 mL retrobulbar, followed by additional 5  mL, the latter in the fashion of a modified [employer]  The right  periocular region was sterilely prepped and draped in the usual  ophthalmic fashion.  A lid speculum was applied.  A 25-gauge trocar  infusion placed.  Superonasal trocar applied.  Conjunctival peritomy was  fashioned supratemporally.  MVR blade was used to make the  superotemporal sclerotomy and silicone oil was explanted on direct  visualization through the BIOM microscope using the oil extractor.  No  complications occurred.  Fluid-air exchange completed on one occasion so  as to remove a smaller trapped particles of oil.  Air-fluid exchange put  into place.  Endolaser photocoagulation placed one more row for  retinopexy purposes, posterior at a previous treatment.  Difficulty.  The retina remained nicely attached.  Findings of a macular hole were  noted which will limit his ultimate visual function.  Optic nerve is pink.  At this time,  the sclerotomy was closed with 7-0  Vicryl.  Conjunctiva closed with 7-0 Vicryl.  The other trocars were  removed.  Subconjunctival Decadron applied.  A sterile patch and Fox  shield were applied.  The patient tolerated the procedure without any  complication.      Alford Highland Rankin, M.D.  Electronically Signed     GAR/MEDQ  D:  07/27/2007  T:  07/27/2007  Job:  [id number]

## 2011-03-24 NOTE — Assessment & Plan Note (Signed)
[facility]                            CARDIOLOGY OFFICE NOTE   NAME:JOHNSONKevion, Fatheree                     MRN:          [id number]  DATE:07/15/2007                            DOB:          03/08/1929    Mr. Tafoya returns today for followup.   Patient has a history of coronary artery bypass surgery in 1999.  He had  a LIMA to the LAD, vein graft to the diagonal, vein graft sequentially  to the obtuse marginal branch I and II, and a vein graft to the PDA.  He  has not had a Myoview in five years.   His risk factors are well modified.  The last time I saw him, we  increased some of his blood pressure medicine.  He was also severely  depressed.  His 75 year old mother had passed away as well as his long-  term Ship broker, Librarian, academic.  He seems to be doing better.  His daughter was  with him today.  She lives here in [place] and has been encouraging  HoJo to move from [place] into the city.  He seems a little reluctant to  do this.  Actually, Khaliq seems to be doing much better than the last  time I saw him in terms of his spirits.  He has had some exertional  dyspnea.  The last time I saw him, it was fairly clearly that he had  asthma.  I gave him a prescription for an albuterol inhaler, and this  has helped.  I told him we should probably get formal PFTs and to see if  he needs followup with pulmonary since he does have some exertional  dyspnea.  His exertional dyspnea is stable.  It has not been  progressive.  There is no cough, sputum production, or fevers.  I do not  think it is an anginal equivalent.   Patient has been compliant with his medications.  He has had reasonable  risk factor modification with an LDL under 80.   His review of systems otherwise is negative.   His medications include an aspirin a day, Ramipril 2.5 a day, albuterol  inhaler p.r.n., Lipitor 20 a day, Zetia 10 a day.  He is not on a beta  blocker due to wheezing.   PHYSICAL EXAMINATION:  Exam is remarkable for an elderly white male in  no distress.  Affect is improved.  Weight is 191.  Blood pressure is 140/80.  Pulse 71 and regular.  He is  afebrile.  Respiratory rate is 14.  HEENT:  Normal.  LUNGS:  Clear with no active wheezing.  Good diaphragmatic motion.  There are no bruits, no lymphadenopathy, no thyromegaly.  No JVP  elevation.  There is an S1 and S2 with normal heart sounds.  PMI is normal.  ABDOMEN:  Benign.  He is status post sternotomy.  He has a ventral  hernia from previous colon surgery.  No AAA.  Bowel sounds are positive.  No tenderness. No hepatosplenomegaly.  No hepatojugular reflux.  No  tenderness.  Femorals are +3 bilaterally.  There is no lower extremity  edema.  PTs are +3.  NEURO:  Nonfocal.  There is no muscular weakness.  SKIN:  Warm and dry.   EKG shows sinus rhythm with poor R wave progression.  Left axis  deviation.   IMPRESSION:  1. Stable coronary artery bypass surgery in 1999.  Follow up stress      Myoview.  I think Corinthian can walk for the test.  I prefer to do      this, as he would probably get bronchospastic with Adenosine.  I do      not think his dyspnea is an anginal equivalent, but it has been      five years since he has had a Myoview, and his bypass grafts are      quite old, being done in 1999.  2. Risk factor modification:  Continue Zetia and Lipitor.  Follow up      lipid and liver profile in six months.  3. Hypertension:  Currently better controlled with low dose Ramipril.      Continue a low salt diet.  4. Probable asthma with bronchospastic component.  Continue albuterol.      He says he uses it almost every day.  Formal PFTs pre and post      bronchodilator.  Referral to pulmonary if needed for possible      addition of Advair or Symbicort.  5. HoJo needs a repeat cataract surgery.  He has had probably five      procedures by Dr. Luciana Axe on his right eye.  This is scheduled for      the second  week of September.  I told Keanon that I would try to do      his stress test and PFTs on the 15th when he is to have his blood      work, to minimize his trips in from [place].  I do not think that      either of these tests would preclude surgery.  He is cleared for      cataract surgery as need be.  I will see him back in six months, as      long as his stress test and PFTs do not require immediate action.     Noralyn Pick. Eden Emms, MD, [facility]  Electronically Signed    PCN/MedQ  DD: 07/15/2007  DT: 07/15/2007  Job #: [id number]

## 2011-03-27 NOTE — Op Note (Signed)
NAME:  [name], [name]              ACCOUNT NO.:  [id number]   MEDICAL RECORD NO.:  [id number]          PATIENT TYPE:  AMB   LOCATION:  SDS                          FACILITY:  [facility]   PHYSICIAN:  Alford Highland. Rankin, M.D.   DATE OF BIRTH:  1929/10/08   DATE OF PROCEDURE:  12/08/2005  DATE OF DISCHARGE:  12/08/2005                                 OPERATIVE REPORT   PREOPERATIVE DIAGNOSES:  1.  Tractional detachment to the right eye.  2.  Proliferative vitreoretinopathy, right eye.   POSTOPERATIVE DIAGNOSES:  1.  Tractional detachment to the right eye.  2.  Proliferative vitreoretinopathy, right eye.  3.  Choroidal hemorrhage, right eye.   OPERATION PERFORMED:  1.  Posterior vitrectomy with endolaser panphotocoagulation for retinopexy      purposes.  2.  Injection of vitreous substitute, temporary, Perfluoron, both eyes.  3.  Insertion of vitreous substitute, permanent, silicone oil, 5000      centistokes.  4.  External drainage of choroidal hemorrhage, transsphenoidal approach      inferotemporally, right eye.   SURGEON:  Alford Highland. Rankin, M.D.   ANESTHESIA:  Local retrobulbar with monitored anesthesia control.   INDICATIONS FOR PROCEDURE:  The patient is a 77 year old man who is 2-1/2  weeks status post vitrectomy and removal of lens fragments from complicated  cataract surgery.  Uneventful surgery is now followed by development of  retinal detachment.  He was seen yesterday in the office and is scheduled  for repair of retinal detachment.  Last night, he developed severe pain and  aching in the right eye.  He understands this is an attempt to reattach the  retina.  The patient understands the risks of anesthesia including the rare  occurrence of death but also to the eye including but not limited to  hemorrhage, infection, scarring, need for another surgery, no change in  vision, loss of vision and progression of disease despite intervention.  After appropriate signed consent was  obtained, the patient was taken to the  operating room.   DESCRIPTION OF PROCEDURE:  In the operating room, appropriate monitoring was  followed by mild sedation.  0.75% Marcaine was delivered 5 mL retrobulbar  followed by an additional 5 mL laterally in the fashion of modified Darel Hong.  The  periocular region was sterilely prepped and draped in the usual  ophthalmic fashion.  A lid speculum was applied.  Conjunctival peritomy was  fashioned inferotemporally and superior quadrants.  The infusion secured in  the infratemporal quadrant.  Superior sclerotomies then placed.  At this  time instruments were inserted in the eye.  Notable findings were not only  of detached retina superiorly, temporally and nasally, but also of what  appeared to be a subchoroidal hemorrhage.  This caused a mounded type  elevation.   Under fluid, Perfluoron was injected to flatted the macular region.  This  confirmed that there was a choroidal hemorrhage temporal quadrant.  At this  time with Perfluoron in the eye, fluid-air exchange was then carried out  overtop of the Perfluoron.  This helped to flatten the peripheral  retinal  detachment. At this time decision was made to per perform cutdown sclerotomy  so as to allow as much of the suprachoroidal hemorrhage to mobilize as  possible.  Cutdown sclerotomy confirmed that there was blood in the  suprachoroidal space.  This was carried out in the inferotemporal quadrant.  However, the blood since it's early onset of extent was not liquefactive.  No heroic measures were taken to try to extract clot.   The sclerotomy was left open for eventual egress of the clot once  liquefaction occurred.   At this time fluid-air exchange was again completed overlying the  Perfluoron.  The Perfluoron was removed and the retina reattached nicely.  Endolaser panphotocoagulation was placed 360 degrees.  Good retinopexy take  was noted.  At this time an air-silicone oil exchange  was completed  passively.  Superior sclerotomy was closed with 7-0 Vicryl.  The infusion  removed and similarly.  Conjunctiva closed with 7-0 Vicryl in interrupted  fashion.  Subconjunctival injection of antibiotic and steroid applied.  A  sterile patch and Fox shield were applied.  The patient was then transferred  to the recovery room in good and stable condition.      Alford Highland Rankin, M.D.  Electronically Signed     GAR/MEDQ  D:  12/08/2005  T:  12/09/2005  Job:  [id number]

## 2011-03-27 NOTE — Assessment & Plan Note (Signed)
[facility]                            CARDIOLOGY OFFICE NOTE   NAME:JOHNSONRithwik, Schmieg                     MRN:          [id number]  DATE:01/21/2007                            DOB:          01/27/1929    The patient returns today for followup.  He was a little bit sad.  He  lost his mother, who was 75 years old recently.  He also lost his  terrier dog, Rexford Maus, who he had had for eleven years.  He has not had any  significant chest pain.  He has a history of distant coronary bypass  surgery.   He has not had a Myoview in a while but he has been stable clinically.  The patient's exercise activity is limited by his osteoporosis and  chronic back pain.   In reviewing his medicine, he is on aspirin and a statin.  He was asking  to be on a generic drug and we will switch him from Lipitor to  Pravachol.  He had previously been on atenolol.  I suspect we stopped  this due to his wheezing.  Clinically, he occasionally gets asthma.   The patient's current medications include:  1. Lipitor 20 mg a day.  2. Zetia 10 mg a day.  3. An aspirin a day.  4. Ramipril 2.5 mg a day.   As indicated, he is not on a beta blocker despite having had a CABG due  to clinical asthma and wheezing.   PHYSICAL EXAMINATION:  VITAL SIGNS:  Today, the blood pressure is 140/80.  Pulse is 70 and  regular.  HEENT:  Normal.  Carotids normal without bruits.  LUNGS:  Clear.  CARDIAC:  S1, S2 with normal heart sounds.  ABDOMEN:  Benign.  LOWER EXTREMITIES:  Intact pulses.  No edema.   IMPRESSION:  Stable status post coronary artery bypass graft.  He had an  echo in September of 2007 showing an ejection fraction of 50 to 55  percent, without significant valvular disease.  He is currently  asymptomatic.  I do not think he needs a stress test.  The patient will  have his Lipitor switched to Pravachol.  He will have followup lipid and  liver profile in six months to a year.   In  January of 2008, his liver function tests were normal and his total  cholesterol was only 110.   Blood pressure is well controlled with low dose ramipril.   In the future, if the patient were to have increasing angina, we could  try to rechallenge him with a beta blocker.  I did give him a  prescription for an albuterol inhaler today for his wheezing.   Overall, the patient seems to be doing well except for some depression  from his mother's and pet's death.  I will see him back in six months.     Noralyn Pick. Eden Emms, MD, [facility]  Electronically Signed    PCN/MedQ  DD: 01/21/2007  DT: 01/22/2007  Job #: [id number]

## 2011-03-27 NOTE — Op Note (Signed)
NAME:  [name], [name]                        ACCOUNT NO.:  [id number]   MEDICAL RECORD NO.:  [id number]                   PATIENT TYPE:  AMB   LOCATION:  ENDO                                 FACILITY:  [facility]   PHYSICIAN:  Anselmo Rod, M.D.               DATE OF BIRTH:  [birth year]   DATE OF PROCEDURE:  02/19/2003  DATE OF DISCHARGE:                                 OPERATIVE REPORT   PROCEDURE PERFORMED:  Screening colonoscopy.   ENDOSCOPIST:  Charna Elizabeth, M.D.   INSTRUMENT USED:  Olympus video colonoscope.   INDICATIONS FOR PROCEDURE:  The patient is a 77 year old white male with  history of occasional rectal bleeding undergoing a screening colonoscopy to  rule out colonic polyps, masses, hemorrhoids, etc.   PREPROCEDURE PREPARATION:  Informed consent was procured from the patient.  The patient was fasted for eight hours prior to the procedure and prepped  with a bottle of magnesium citrate and a gallon of GoLYTELY the night prior  to the procedure.  The patient states he did not complete the whole gallon  of GoLYTELY as instructed.   PREPROCEDURE PHYSICAL:  The patient had stable vital signs.  Neck supple.  Chest clear to auscultation.  S1 and S2 regular.  Abdomen soft with normal  bowel sounds.   DESCRIPTION OF PROCEDURE:  The patient was placed in left lateral decubitus  position and sedated with 80 mg of Demerol and 9 mg of Versed intravenously.  Once the patient was adequately sedated and maintained on low flow oxygen  and continuous cardiac monitoring, the Olympus video colonoscope was  advanced from the rectum to the cecum.  There was a large amount of residual  stool in the colon, especially on the right side.  Visualization was  limited.  The patient's position was changed from the left lateral to the  supine and the right lateral positions to reach the cecal base.  The  appendicular orifice and the ileocecal valve were visualized.  Left-sided  diverticulosis  was noted.  Retroflexion in the rectum revealed prominent  nonbleeding internal hemorrhoids.  As there was a large amount of residual  stool in the colon, small lesions could have been missed.  However, no  masses or polyps were seen.   IMPRESSION:  1. Prominent internal hemorrhoids.  2. Left-sided diverticulosis.  3. Large amount of residual stool in the colon, especially on the right     side.  Small lesions could have been missed.    RECOMMENDATIONS:  1. A high fiber diet with liberal fluid intake has been advocated.  2. Outpatient follow-up in the next two weeks or earlier if need be.     Further recommendations will be made at that time.  Anselmo Rod, M.D.    JNM/MEDQ  D:  02/19/2003  T:  02/19/2003  Job:  [id number]   cc:   Donia Guiles, M.D.  [address] [place]  [place] [address]  Fax: [phone]

## 2011-03-27 NOTE — Op Note (Signed)
NAMEWILFERD, [name]              ACCOUNT NO.:  [id number]   MEDICAL RECORD NO.:  [id number]          PATIENT TYPE:  AMB   LOCATION:  SDS                          FACILITY:  [facility]   PHYSICIAN:  Alford Highland. Rankin, M.D.   DATE OF BIRTH:  1929-06-06   DATE OF PROCEDURE:  01/05/2006  DATE OF DISCHARGE:  01/05/2006                                 OPERATIVE REPORT   PREOPERATIVE DIAGNOSES:  1.  Proliferative vitreoretinopathy, stage C2, right eye.  2.  Tractional detachment, right eye.  3.  Rhegmatogenous detachment, right eye.  4.  Resolving choroidal hemorrhage, right eye.  5.  Status post previous complex vitrectomy repair with instillation of      intraocular silicone oil.   POSTOPERATIVE DIAGNOSES:  1.  Proliferative vitreoretinopathy, stage C2, right eye.  2.  Tractional detachment, right eye.  3.  Rhegmatogenous detachment, right eye.  4.  Resolving choroidal hemorrhage, right eye.  5.  Status post previous complex vitrectomy repair with instillation of      intraocular silicone oil.   OPERATION PERFORMED:  1.  Posterior vitrectomy, membrane peel, proliferative vitreoretinopathy      membranes.  2.  Endolaser panphotocoagulation, right eye.  3.  Scleral buckle, right eye.  4.  Instillation of permanent silicone oil, 5000 cSt, right eye.  5.  Removal of nonmagnetic foreign body, silicone oil as the first step of      the procedure.   SURGEON:  Alford Highland. Rankin, M.D.   ANESTHESIA:  Local retrobulbar with monitored anesthesia control.   INDICATIONS FOR PROCEDURE:  The patient is a 77 year old man who has complex  rhegmatogenous retinal detachment and redetachment with proliferative  vitreoretinopathy with star folds and epiretinal membranes.  The patient  understands this is an attempt to remove the epiretinal membranes so as to  allow for the retina to be reattached.  He understands the risks of  anesthesia including the rare occurrence of death but also to the eye  including  but not limited to hemorrhage, infection, scarring, need for  another surgery, no change in vision, loss of vision and progression of  disease despite intervention.  After appropriate signed consent was  obtained, the patient was taken to the operating room.   DESCRIPTION OF PROCEDURE:  In the operating room, appropriate monitoring was  followed by mild sedation.  0.75% Marcaine was delivered 5 mL retrobulbar  followed by an additional 5 mL laterally in the fashion of modified Darel Hong.  The  periocular region was sterilely prepped and draped in the usual  ophthalmic fashion.  A lid speculum was applied.  Conjunctival peritomy was  fashioned 3 mm posterior to the limbus circumferentially.  Rectus muscles  isolated on 2-0 silk ties.  Retinal cryopexy was then applied 360 degrees  for two rows to help in reattaching the retina.  At this time a 240 band was  selected and placed under each of the rectus muscles and secured end-to-end  with a Watzke sleeve in the superonasal quadrant.  Permanent 5-0 Mersilene  mattress sutures were then placed in each of the quadrants  to suture the  band and appropriate tension was applied.  At this time infusion cannula was  secured in the inferotemporal quadrant.  Placement in the vitreous cavity  verified visually.  Superior sclerotomy was fashioned.  The microscope  placed in position.  Viscous fluid injector was then used to evacuate  silicone oil from the eye.  Notable findings were of significant star folds  at the 10 o'clock, 9 o'clock, 8 o'clock positions.  These were all posterior  to the equator.   Under fluid, the star folds were mobilized using membrane peeling techniques  with the [facility] forceps.  Excellent mobilization of the retina occurred.  At  this time internal retinotomy was fashioned inferior to the inferotemporal  arcade in the bed of the detachment.  Detachment extended from the 6 o'clock  position temporally and up to the 11:30 position  temporally.  At this time,  fluid-fluid exchange was then carried out.  Fluid-air exchange carried out  and the retina reattached nicely.  Endolaser panphotocoagulation was placed  in the bed of the detachment as well as around the retinotomy site.   At this time superonasal sclerotomy closed with 7-0 Vicryl.  The superior  temporal retinotomy site then used to infuse passively, silicone oil 5000  cSt.  At this time the infusion was also removed.  Bed of the buckle was  irrigated with bug juice.  Conjunctiva was then brought forward and closed  in layers with Tenon's with 7-0 Vicryl suture in a running fashion.  Excellent intraocular pressure was assessed.  A sterile patch and Fox shield  were applied.  The patient tolerated the procedure well without  complication.      Alford Highland Rankin, M.D.  Electronically Signed     GAR/MEDQ  D:  01/05/2006  T:  01/06/2006  Job:  [id number]

## 2011-03-27 NOTE — Op Note (Signed)
[name], [name]              ACCOUNT NO.:  [id number]   MEDICAL RECORD NO.:  [id number]          PATIENT TYPE:  AMB   LOCATION:  SDS                          FACILITY:  [facility]   PHYSICIAN:  Alford Highland. Rankin, M.D.   DATE OF BIRTH:  03/12/1929   DATE OF PROCEDURE:  11/18/2005  DATE OF DISCHARGE:                                 OPERATIVE REPORT   PREOPERATIVE DIAGNOSES:  1.  Retained lens fragment right eye - vitreous and anterior chamber.  2.  Aphakia right eye.  3.  Nonmagnetic foreign body right eye.   POSTOPERATIVE DIAGNOSES:  1.  Retained lens fragment right eye - vitreous and anterior chamber.  2.  Aphakia right eye.  3.  Nonmagnetic foreign body right eye.  4.  Vitreous strands with traction superiorly.   PROCEDURES:  1.  Posterior vitrectomy with Endolaser photocoagulation right eye for      retinal detachment prophylaxis.  2.  Insertion of posterior chamber intraocular lens - secondary - sulcus      Alcon model [id number] power +19.   SURGEON:  Alford Highland. Rankin, M.D.   ANESTHESIA:  Local bulbar, monitored anesthesia control.   COMPLICATIONS:  None.   INDICATIONS FOR PROCEDURE:  Patient is a 77 year old man who, earlier today,  had posterior dislocation of nuclear lens fragments by Dr. Nile Fuentes with  rupture of the capsule.  This was an attempt to remove the anterior and  posterior chamber intraocular lens fragments.  Patient understands the risks  of anesthesia including rare occurrence of death but also to the eye  including, but not limited to, hemorrhage, infection, scarring, need for  another surgery, no change in vision, loss of vision and progression of  disease despite intervention.  Appropriate signed consent was obtained.  Patient was taken to the operating room.   In the operating room, appropriate monitoring is followed by mild sedation.  Marcaine 0.75% delivered by anesthesia retrobulbar for additional 5 mL.  Laterally fashioned modified Darel Hong.  Right  periocular region sterilely  prepped and draped in the usual ophthalmic fashion.  Lid speculum applied.  Conjunctiva peritomy was then fashioned superiorly and inferotemporally.  Infusion cannula was placed 3.5 mm posterior to the limbus.  Placement in  the vitreous cavity verified visually.  Superior sclerotomy was then  fashioned.  Microscope placed in position with BIOM attachment.  Core  vitrectomy was then begun.  Retained lens fragments were identified and  removed.  Phaco fragmentation in the vitreous cavity posterior was required  as well to remove these nonmagnetic foreign bodies.  Vitreous skirt trimmed  360 degrees.  The capsular clean-up was then carried out with cortical  fragments remaining in the capsule.  Significant and nearly complete  anterior capsular rim was noted 360 degrees.  Large rupture on the posterior  capsule was identified.  This allowed for placement of the sulcus posterior  chamber intraocular lens.  Grooved limbal incision was fashioned superiorly.  Under low infusion pressures, the anterior chamber was opened, deepened with  Provisc and then the wound was opened to allow for placement of the one  piece PMMA lens.  This was rotated into the sulcus and oriented at  approximately the 6 and 12 o'clock positions in the sulcus with excellent  centration and positive bounce-back tests confirming excellent security in  its location.   At this time, limbal wound was closed with interrupted 10-0 nylons.  At this  time, further vitrectomies then confirmed that there were some interior  fragments remaining in the vitreous base.  These were removed.  Laser  photocoagulation is placed superiorly and 360 degrees anterior to the  equator for prophylaxis because of the high risk of retinal detachment in  these patients.   At this time, instruments were removed from the eye.  Superior sclerotomy  closed with 7-0 Vicryl.  The infusion removed and similarly closed.   Conjunctiva closed with 7-0 Vicryl.  Subconjunctival injection of steroid  was applied.  Sterile patch and Fox shield applied.  Patient was taken to  the short-stay area and discharged home as an outpatient.      Alford Highland Rankin, M.D.  Electronically Signed     GAR/MEDQ  D:  11/18/2005  T:  11/19/2005  Job:  [id number]   cc:   [name], M.D.  Fax: [phone]

## 2011-03-27 NOTE — Assessment & Plan Note (Signed)
[facility]                              CARDIOLOGY OFFICE NOTE   NAME:JOHNSONAulton, Routt                     MRN:          [id number]  DATE:07/09/2006                            DOB:          [birth year]    He returns for followup.  He is status post CABG.  He previously has a good  left ventricular function.  He has had some exertional dyspnea.  It does not  sound like COPD.  He is not particularly bothered by the heat. He had good  left ventricular function before his bypass so we will need to recheck this.  He has not had any chest pain.   The patient's abdomen is well-healed.  He had a bout of colitis requiring  surgery.  Unfortunately, his mother died recently and I believe it was about  two years ago that his wife passed.  He seems a bit depressed and lonely  still.   On exam, blood pressure 130/70, pulse 70 and regular.  LUNGS:  Clear.  NECK:  Carotid __________  due to the systolic murmur.  ABDOMEN:  Benign.  There is a midline scar.  EXTREMITIES:  His pulses are intact.  No edema.   EKG shows sinus rhythm and is normal.   IMPRESSION:  Previous coronary artery bypass surgery.  No EKG changes.  No  chest pain.  I do not think he needs to follow up Myoview.  He is having  exertional dyspnea. I think it is reasonable to check an echo for left  ventricular function and rule out significant pulmonary hypertension.   As long as there is echo, it does not show any regional wall motion  abnormalities or poor left ventricular function.  We will continue his  current medications and I will see him back in six months.                               Noralyn Pick. Eden Emms, MD, [facility]    PCN/MedQ  DD:  07/09/2006  DT:  07/10/2006  Job #:  [id number]

## 2011-03-27 NOTE — Op Note (Signed)
NAMEEDSON, DERIDDER              ACCOUNT NO.:  [id number]   MEDICAL RECORD NO.:  [id number]          PATIENT TYPE:  AMB   LOCATION:  SDS                          FACILITY:  [facility]   PHYSICIAN:  Alford Highland. Rankin, M.D.   DATE OF BIRTH:  21-Aug-1929   DATE OF PROCEDURE:  07/14/2006  DATE OF DISCHARGE:                                 OPERATIVE REPORT   PREOPERATIVE DIAGNOSES:  1. History of combined traction-rhegmatogenous retinal detachment of the      right eye, status post repair of proliferative vitreal retinopathy,      controlled with vitrectomy and Silicone oil.  2. Retained silicone oil implant, right eye.   POSTOPERATIVE DIAGNOSES:  1. History of combined traction-rhegmatogenous retinal detachment of the      right eye, status post repair of proliferative vitreal retinopathy,      controlled with vitrectomy and Silicone oil.  2. Retained silicone oil implant, right eye.  3. Recurrent inferior retinal detachment, developed during the removal of      the Silicone oil, with active evacuation.  4. Maximal macular hole.   PROCEDURES:  1. Posterior vitrectomy with membrane peel, internal limiting membrane and      proliferative vitreal retinopathy membranes inferiorly.  2. Focal laser photocoagulation for repair of retinal detachment      inferiorly.  3. Inferior retinectomy using internal diathermy for hemostasis.  4. Injection into vitreous substance, Silicone oil 5000 centistokes, to      maintain retinal reattachment inferiorly.   SURGEON:  Alford Highland. Rankin, M.D.   FINDINGS:  The site of the PVRs inferiorly around the previous site of  dissection, retinotomy and retinal hole formation, which required further  removal of epiretinal tissues and epiretinal fibrous tissues, as well as a  retinotomy because of intraretinal fibrous contracture.   ANESTHESIA:  Local retrobulbar with monitored anesthesia control.   INDICATIONS FOR PROCEDURE:  The patient is a 77 year old man, who  is now 6  months status post removal of Silicone oil for intravitreal tamponade after  retinal detachment repair.  The patient understands this is an attempt to  remove the oil so as to maintain the retinal reattachment after oil has  remained on to provide better visual acuity.  The patient understands the  risks of anesthesia, including the risks of death, loss of the eye, and  including but not limited to hemorrhage, infection, scarring, need for  further surgery, no change in vision, loss of vision, progression of disease  despite intervention. Appropriate signed consent was obtained.   DESCRIPTION OF PROCEDURE:  The patient was taken to the operating room.  In  the operating room, appropriate monitoring was followed by mild sedation.  Marcaine 0.75% was delivered, 5 cc retrobulbar, followed by additional 5 cc,  the latter in the fashion of a modified [employer]  The right periocular  region was sterilely prepped and draped in the usual ophthalmic fashion.  A  lid speculum was applied.  A conjunctival peritomy was then fashioned  inferotemporally and superiorly. The infusion cannula was secured at 3.5 mm  posterior to the limbus.  Placement in the vitreous cardiovascular was  venereal disease visually.  A superior sclerotomy was fashioned.  The  microscope was placed in position with the BIOM attached.  An 18-gauge  Silastic cannula was placed on the vitreous fluid extractor.  Silicone oil  extraction was then carried out in this fashion.  Once approximately 50% of  the oil had been removed, it was clear that there was recurrence of an  inferior retinal detachment.  Fibrous contraction of the retina pulled the  retina off from overlying the inferior buckle all the way to the  inferotemporal arcade.  There appeared to be intraretinal fibrous  contracture, as well as some epiretinal tissues just posterior to what  appeared to be the vitreous base.   At this time, the remainder of the  Silicone oil was removed.  Fluid-air  exchange, approximately 50%, carried out, leaving a small amount of Silicone  on the surface of the aqueous fluid.  Then, fluid was refilled into the eye,  and under fluid, dissection was then carried out on the proliferative  membranes inferiorly with forceps, as well as later with approximately 2 1/2  clock hours of diathermy to the peripheral retina so as to allow for  hemostasis and to transect this area of retina, the retinotomy and retina in  partial retinectomy fashion.  The retinectomy was carried out anterior to  the retinotomy sites to remove the peripheral retina to prevent ischemic  stimulus.   At this time, the retina was extremely mobile.  Fluid-air exchange was  completed.  All fluid was removed.  It must be noted also that prior to the  fluid-air exchange, a small macular hole was noted.  Internal limiting  membrane was identified, and this was removed off the macular region.   At this time, the retina flattened nicely.  Endolaser photocoagulation was  then carried out in a retinopexy fashion.  At this time, a passive exchange  of air-Silicone oil was then carried out.  The superior sclerotomy was  closed with 7-0 Vicryl.  Appropriate fill was obtained.  The infusion was  removed and similarly closed with 7-0 Vicryl.   At this time, the conjunctiva was then closed with 7-0 Vicryl as well.  The  infusion had been removed and similarly closed with 7-0 Vicryl.  Subconjunctival injection of antibiotics were then applied.  A sterile patch  and a Fox shield were applied.  The patient tolerated the procedure without  complication.  The patient was taken to the recovery room in good and stable  condition.      Alford Highland Rankin, M.D.  Electronically Signed     GAR/MEDQ  D:  07/14/2006  T:  07/14/2006  Job:  [id number]

## 2011-04-07 ENCOUNTER — Other Ambulatory Visit: Payer: Self-pay | Admitting: *Deleted

## 2011-04-07 MED ORDER — RAMIPRIL 2.5 MG PO CAPS: 2.5000 mg | ORAL_CAPSULE | Freq: Every day | ORAL | Status: DC

## 2011-04-07 NOTE — Telephone Encounter (Signed)
rx sent in today, pt needs ov with Dr. Eden Emms

## 2011-05-28 ENCOUNTER — Encounter: Payer: Self-pay | Admitting: Cardiovascular Disease

## 2011-08-06 ENCOUNTER — Other Ambulatory Visit: Payer: Self-pay | Admitting: *Deleted

## 2011-08-06 MED ORDER — ALBUTEROL SULFATE HFA 108 (90 BASE) MCG/ACT IN AERS: INHALATION_SPRAY | RESPIRATORY_TRACT | Status: DC

## 2011-08-20 LAB — BASIC METABOLIC PANEL
Calcium: 9
GFR calc Af Amer: >60
GFR calc non Af Amer: >60
Sodium: 138

## 2011-08-20 LAB — CBC
Hemoglobin: 14.7
RBC: 4.71

## 2011-08-20 LAB — PROTIME-INR: INR: 1

## 2011-10-20 ENCOUNTER — Encounter: Payer: Self-pay | Admitting: Cardiovascular Disease

## 2011-11-11 ENCOUNTER — Encounter: Payer: Self-pay | Admitting: Cardiovascular Disease

## 2011-11-11 ENCOUNTER — Encounter: Payer: Self-pay | Admitting: *Deleted

## 2011-11-12 ENCOUNTER — Ambulatory Visit (INDEPENDENT_AMBULATORY_CARE_PROVIDER_SITE_OTHER): Payer: Medicare Other | Admitting: Cardiovascular Disease

## 2011-11-12 ENCOUNTER — Encounter: Payer: Self-pay | Admitting: Cardiovascular Disease

## 2011-11-12 VITALS — BP 167/92 | HR 67 | Ht 66.0 in | Wt 194.1 lb

## 2011-11-12 DIAGNOSIS — R0989 Other specified symptoms and signs involving the circulatory and respiratory systems: Secondary | ICD-10-CM | POA: Diagnosis not present

## 2011-11-12 DIAGNOSIS — I10 Essential (primary) hypertension: Secondary | ICD-10-CM

## 2011-11-12 DIAGNOSIS — R0600 Dyspnea, unspecified: Secondary | ICD-10-CM

## 2011-11-12 DIAGNOSIS — E785 Hyperlipidemia, unspecified: Secondary | ICD-10-CM | POA: Diagnosis not present

## 2011-11-12 DIAGNOSIS — I2510 Atherosclerotic heart disease of native coronary artery without angina pectoris: Secondary | ICD-10-CM

## 2011-11-12 DIAGNOSIS — R0609 Other forms of dyspnea: Secondary | ICD-10-CM

## 2011-11-12 DIAGNOSIS — R0602 Shortness of breath: Secondary | ICD-10-CM

## 2011-11-12 NOTE — Assessment & Plan Note (Signed)
Cholesterol is at goal.  Continue current dose of statin and diet Rx.  No myalgias or side effects.  F/U  LFT's in 6 months. Lab Results  Component Value Date   LDLCALC 94 05/27/2010             

## 2011-11-12 NOTE — Assessment & Plan Note (Signed)
Likely asthmatic disease.  Continue inhalers  F/U echo

## 2011-11-12 NOTE — Progress Notes (Signed)
[name] is seen today for F/U CAD with CABG in 1999. He had a normal myovue in 2008. He denies SSCP or angina. He still lives alone in [place]. His daughter and son in law look in on him frequently and take good care of him. He still seems depressed since his wife died a few years ago. He decided not to get another dog since "lucky" died. He has had some wheezing and dyspnea. Last time I saw him with this complaint he had allergies and some bronchospastic disease. Inhalers helped and he needs to restart this. His primary Doctor Clelia Croft has done a CXR on him recently and it was "ok". He also gets some dyspnea on bending over which is funcitonal. His son in law Roland Rack was with him today.    ROS: Denies fever, malais, weight loss, blurry vision, decreased visual acuity, cough, sputum,  hemoptysis, pleuritic pain, palpitaitons, heartburn, abdominal pain, melena, lower extremity edema, claudication, or rash.  All other systems reviewed and negative  General: Affect appropriate Healthy:  appears stated age HEENT: normal Neck supple with no adenopathy JVP normal no bruits no thyromegaly Lungs clear with no wheezing and good diaphragmatic motion Heart:  S1/S2 no murmur,rub, gallop or click PMI normal Abdomen: benighn, BS positve, no tenderness, no AAA no bruit.  No HSM or HJR Distal pulses intact with no bruits No edema Neuro non-focal Skin warm and dry No muscular weakness   Current Outpatient Prescriptions  Medication Sig Dispense Refill  . albuterol (PROVENTIL HFA;VENTOLIN HFA) 108 (90 BASE) MCG/ACT inhaler USE 1 TO 2 PUFFS EVERY 4-6 HOURS AS NEEDED  1 Inhaler  12  . alendronate (FOSAMAX) 70 MG tablet Take 70 mg by mouth every 7 (seven) days. Take with a full glass of water on an empty stomach.       Marland Kitchen aspirin 81 MG tablet Take 160 mg by mouth daily.        . B Complex-C (SUPER B COMPLEX PO) Take 1 tablet by mouth daily.        . cholecalciferol (VITAMIN D) 1000 UNITS tablet Take 1,000  Units by mouth daily.        . Multiple Vitamin (MULTIVITAMIN) capsule Take 1 capsule by mouth daily.        . Omega-3 Fatty Acids (FISH OIL CONCENTRATE PO) Take 1 tablet by mouth daily.        . pravastatin (PRAVACHOL) 40 MG tablet Take 40 mg by mouth daily.        . ramipril (ALTACE) 2.5 MG capsule Take 1 capsule (2.5 mg total) by mouth daily.  30 capsule  0  . ranitidine (ZANTAC) 75 MG tablet Take 75 mg by mouth 2 (two) times daily.        . vitamin C (ASCORBIC ACID) 500 MG tablet Take 500 mg by mouth daily.          Allergies  Sulfonamide derivatives  Electrocardiogram:  Assessment and Plan

## 2011-11-12 NOTE — Assessment & Plan Note (Signed)
Stable with no angina and good activity level.  Continue medical Rx  

## 2011-11-12 NOTE — Patient Instructions (Signed)
Your physician wants you to follow-up in: 6 MONTHS WITH DR Haywood Filler will receive a reminder letter in the mail two months in advance. If you don't receive a letter, please call our office to schedule the follow-up appointment. Your physician recommends that you continue on your current medications as directed. Please refer to the Current Medication list given to you today. Your physician has requested that you have an echocardiogram. Echocardiography is a painless test that uses sound waves to create images of your heart. It provides your doctor with information about the size and shape of your heart and how well your heart's chambers and valves are working. This procedure takes approximately one hour. There are no restrictions for this procedure. DX DYSPNEA

## 2011-11-12 NOTE — Assessment & Plan Note (Signed)
Well controlled.  Continue current medications and low sodium Dash type diet.    

## 2011-11-18 ENCOUNTER — Ambulatory Visit (HOSPITAL_COMMUNITY): Payer: Medicare Other | Attending: Cardiology | Admitting: Radiology

## 2011-11-18 DIAGNOSIS — R0609 Other forms of dyspnea: Secondary | ICD-10-CM | POA: Diagnosis not present

## 2011-11-18 DIAGNOSIS — E785 Hyperlipidemia, unspecified: Secondary | ICD-10-CM | POA: Diagnosis not present

## 2011-11-18 DIAGNOSIS — I10 Essential (primary) hypertension: Secondary | ICD-10-CM | POA: Diagnosis not present

## 2011-11-18 DIAGNOSIS — R0600 Dyspnea, unspecified: Secondary | ICD-10-CM

## 2011-11-18 DIAGNOSIS — R0989 Other specified symptoms and signs involving the circulatory and respiratory systems: Secondary | ICD-10-CM | POA: Insufficient documentation

## 2011-12-29 ENCOUNTER — Other Ambulatory Visit: Payer: Self-pay | Admitting: Dermatology

## 2011-12-29 DIAGNOSIS — D485 Neoplasm of uncertain behavior of skin: Secondary | ICD-10-CM | POA: Diagnosis not present

## 2011-12-29 DIAGNOSIS — L821 Other seborrheic keratosis: Secondary | ICD-10-CM | POA: Diagnosis not present

## 2011-12-29 DIAGNOSIS — D239 Other benign neoplasm of skin, unspecified: Secondary | ICD-10-CM | POA: Diagnosis not present

## 2012-02-22 DIAGNOSIS — H33029 Retinal detachment with multiple breaks, unspecified eye: Secondary | ICD-10-CM | POA: Diagnosis not present

## 2012-02-22 DIAGNOSIS — H3340 Traction detachment of retina, unspecified eye: Secondary | ICD-10-CM | POA: Diagnosis not present

## 2012-02-22 DIAGNOSIS — H2510 Age-related nuclear cataract, unspecified eye: Secondary | ICD-10-CM | POA: Diagnosis not present

## 2012-02-22 DIAGNOSIS — H33059 Total retinal detachment, unspecified eye: Secondary | ICD-10-CM | POA: Diagnosis not present

## 2012-03-08 DIAGNOSIS — H2510 Age-related nuclear cataract, unspecified eye: Secondary | ICD-10-CM | POA: Diagnosis not present

## 2012-04-28 ENCOUNTER — Encounter: Payer: Self-pay | Admitting: Cardiovascular Disease

## 2012-04-28 DIAGNOSIS — E782 Mixed hyperlipidemia: Secondary | ICD-10-CM | POA: Diagnosis not present

## 2012-04-28 DIAGNOSIS — I2510 Atherosclerotic heart disease of native coronary artery without angina pectoris: Secondary | ICD-10-CM | POA: Diagnosis not present

## 2012-04-28 DIAGNOSIS — M810 Age-related osteoporosis without current pathological fracture: Secondary | ICD-10-CM | POA: Diagnosis not present

## 2012-04-28 DIAGNOSIS — I10 Essential (primary) hypertension: Secondary | ICD-10-CM | POA: Diagnosis not present

## 2012-04-28 DIAGNOSIS — H9190 Unspecified hearing loss, unspecified ear: Secondary | ICD-10-CM | POA: Diagnosis not present

## 2012-07-22 DIAGNOSIS — Z23 Encounter for immunization: Secondary | ICD-10-CM | POA: Diagnosis not present

## 2012-08-30 DIAGNOSIS — H33029 Retinal detachment with multiple breaks, unspecified eye: Secondary | ICD-10-CM | POA: Diagnosis not present

## 2012-08-30 DIAGNOSIS — H35369 Drusen (degenerative) of macula, unspecified eye: Secondary | ICD-10-CM | POA: Diagnosis not present

## 2012-08-30 DIAGNOSIS — H33059 Total retinal detachment, unspecified eye: Secondary | ICD-10-CM | POA: Diagnosis not present

## 2012-12-12 ENCOUNTER — Telehealth: Payer: Self-pay | Admitting: Cardiovascular Disease

## 2012-12-12 MED ORDER — ALBUTEROL SULFATE HFA 108 (90 BASE) MCG/ACT IN AERS: INHALATION_SPRAY | RESPIRATORY_TRACT | Status: DC

## 2012-12-12 NOTE — Telephone Encounter (Signed)
Ok per [employer]

## 2012-12-12 NOTE — Telephone Encounter (Signed)
New Problem   Refill Request Proventil to [facility]  In [facility]

## 2013-01-04 ENCOUNTER — Encounter: Payer: Self-pay | Admitting: Cardiovascular Disease

## 2013-01-04 ENCOUNTER — Ambulatory Visit: Payer: Medicare Other | Admitting: Cardiovascular Disease

## 2013-01-04 ENCOUNTER — Ambulatory Visit (INDEPENDENT_AMBULATORY_CARE_PROVIDER_SITE_OTHER): Payer: Medicare Other | Admitting: Cardiovascular Disease

## 2013-01-04 VITALS — BP 130/80 | HR 77 | Ht 66.0 in | Wt 193.0 lb

## 2013-01-04 DIAGNOSIS — E785 Hyperlipidemia, unspecified: Secondary | ICD-10-CM | POA: Diagnosis not present

## 2013-01-04 DIAGNOSIS — I10 Essential (primary) hypertension: Secondary | ICD-10-CM

## 2013-01-04 DIAGNOSIS — I25810 Atherosclerosis of coronary artery bypass graft(s) without angina pectoris: Secondary | ICD-10-CM | POA: Diagnosis not present

## 2013-01-04 DIAGNOSIS — I2510 Atherosclerotic heart disease of native coronary artery without angina pectoris: Secondary | ICD-10-CM | POA: Diagnosis not present

## 2013-01-04 DIAGNOSIS — R0602 Shortness of breath: Secondary | ICD-10-CM

## 2013-01-04 NOTE — Assessment & Plan Note (Signed)
Cholesterol is at goal.  Continue current dose of statin and diet Rx.  No myalgias or side effects.  F/U  LFT's in 6 months. Lab Results  Component Value Date   LDLCALC 94 05/27/2010

## 2013-01-04 NOTE — Patient Instructions (Signed)
Your physician wants you to follow-up in:  6 MONTHS WITH DR NISHAN  You will receive a reminder letter in the mail two months in advance. If you don't receive a letter, please call our office to schedule the follow-up appointment. Your physician recommends that you continue on your current medications as directed. Please refer to the Current Medication list given to you today. 

## 2013-01-04 NOTE — Assessment & Plan Note (Signed)
Stable with no angina and good activity level.  Continue medical Rx  

## 2013-01-04 NOTE — Progress Notes (Signed)
Patient ID: [name], male   DOB: 26-Mar-1929, 79 y.o.   MRN: [id number] [name] is seen today for F/U CAD with CABG in 1999. He had a normal myovue in 2008. He denies SSCP or angina. He still lives alone in [place]. His daughter and son in law look in on him frequently and take good care of him. He still seems depressed since his wife died a few years ago. He decided not to get another dog since "lucky" died. He has had some wheezing and dyspnea. Last time I saw him with this complaint he had allergies and some bronchospastic disease. Inhalers helped and he needs to restart this. His primary Doctor [name] has done a CXR on him recently and it was "ok". He also gets some dyspnea on bending over which is funcitonal. His son in law [name] was with him today.  ROS: Denies fever, malais, weight loss, blurry vision, decreased visual acuity, cough, sputum, SOB, hemoptysis, pleuritic pain, palpitaitons, heartburn, abdominal pain, melena, lower extremity edema, claudication, or rash.  All other systems reviewed and negative  General: Affect appropriate Healthy:  appears stated age HEENT: normal Neck supple with no adenopathy JVP normal no bruits no thyromegaly Lungs clear with no wheezing and good diaphragmatic motion Heart:  S1/S2 no murmur, no rub, gallop or click PMI normal Abdomen: benighn, BS positve, no tenderness, no AAA no bruit.  No HSM or HJR Distal pulses intact with no bruits No edema Neuro non-focal Skin warm and dry No muscular weakness   Current Outpatient Prescriptions  Medication Sig Dispense Refill  . albuterol (PROVENTIL HFA;VENTOLIN HFA) 108 (90 BASE) MCG/ACT inhaler USE 1 TO 2 PUFFS EVERY 4-6 HOURS AS NEEDED  1 Inhaler  1  . aspirin 81 MG tablet Take 81 mg by mouth daily.       Marland Kitchen atorvastatin (LIPITOR) 20 MG tablet 20 mg daily.       . B Complex-C (SUPER B COMPLEX PO) Take 1 tablet by mouth daily.        . cholecalciferol (VITAMIN D) 1000 UNITS tablet Take 400 Units  by mouth daily.       . Multiple Vitamin (MULTIVITAMIN) capsule Take 1 capsule by mouth daily.        . Omega-3 Fatty Acids (FISH OIL CONCENTRATE PO) Take 1 tablet by mouth daily.        . ramipril (ALTACE) 2.5 MG capsule Take 5 mg by mouth daily.        . ranitidine (ZANTAC) 75 MG tablet Take 75 mg by mouth.       . vitamin C (ASCORBIC ACID) 500 MG tablet Take 1,000 mg by mouth daily.        No current facility-administered medications for this visit.    Allergies  Sulfonamide derivatives  Electrocardiogram:  SR rate 72  LAD ICRBBB nonspecific ST/T Wave changes  Assessment and Plan

## 2013-01-04 NOTE — Assessment & Plan Note (Signed)
PRN inhaler no wheezing on exam f/u primary

## 2013-01-04 NOTE — Assessment & Plan Note (Signed)
Well controlled.  Continue current medications and low sodium Dash type diet.    

## 2013-02-28 DIAGNOSIS — H33059 Total retinal detachment, unspecified eye: Secondary | ICD-10-CM | POA: Diagnosis not present

## 2013-02-28 DIAGNOSIS — H33329 Round hole, unspecified eye: Secondary | ICD-10-CM | POA: Diagnosis not present

## 2013-02-28 DIAGNOSIS — H33029 Retinal detachment with multiple breaks, unspecified eye: Secondary | ICD-10-CM | POA: Diagnosis not present

## 2013-03-01 ENCOUNTER — Emergency Department (HOSPITAL_COMMUNITY): Payer: No Typology Code available for payment source

## 2013-03-01 ENCOUNTER — Emergency Department (HOSPITAL_COMMUNITY)
Admission: EM | Admit: 2013-03-01 | Discharge: 2013-03-01 | Disposition: A | Discharge status: Home / Self Care | Payer: No Typology Code available for payment source | Attending: Emergency Medicine | Admitting: Emergency Medicine

## 2013-03-01 ENCOUNTER — Encounter (HOSPITAL_COMMUNITY): Payer: Self-pay | Admitting: *Deleted

## 2013-03-01 DIAGNOSIS — Z79899 Other long term (current) drug therapy: Secondary | ICD-10-CM | POA: Insufficient documentation

## 2013-03-01 DIAGNOSIS — S2239XA Fracture of one rib, unspecified side, initial encounter for closed fracture: Secondary | ICD-10-CM | POA: Diagnosis not present

## 2013-03-01 DIAGNOSIS — S3981XA Other specified injuries of abdomen, initial encounter: Secondary | ICD-10-CM | POA: Diagnosis not present

## 2013-03-01 DIAGNOSIS — I25810 Atherosclerosis of coronary artery bypass graft(s) without angina pectoris: Secondary | ICD-10-CM | POA: Diagnosis not present

## 2013-03-01 DIAGNOSIS — I10 Essential (primary) hypertension: Secondary | ICD-10-CM | POA: Diagnosis not present

## 2013-03-01 DIAGNOSIS — Y9389 Activity, other specified: Secondary | ICD-10-CM | POA: Insufficient documentation

## 2013-03-01 DIAGNOSIS — R51 Headache: Secondary | ICD-10-CM | POA: Insufficient documentation

## 2013-03-01 DIAGNOSIS — S2232XA Fracture of one rib, left side, initial encounter for closed fracture: Secondary | ICD-10-CM

## 2013-03-01 DIAGNOSIS — Z7982 Long term (current) use of aspirin: Secondary | ICD-10-CM | POA: Insufficient documentation

## 2013-03-01 DIAGNOSIS — Z8679 Personal history of other diseases of the circulatory system: Secondary | ICD-10-CM | POA: Diagnosis not present

## 2013-03-01 DIAGNOSIS — R109 Unspecified abdominal pain: Secondary | ICD-10-CM | POA: Diagnosis not present

## 2013-03-01 DIAGNOSIS — S0990XA Unspecified injury of head, initial encounter: Secondary | ICD-10-CM | POA: Diagnosis not present

## 2013-03-01 DIAGNOSIS — E785 Hyperlipidemia, unspecified: Secondary | ICD-10-CM | POA: Diagnosis not present

## 2013-03-01 DIAGNOSIS — IMO0001 Reserved for inherently not codable concepts without codable children: Secondary | ICD-10-CM | POA: Diagnosis not present

## 2013-03-01 DIAGNOSIS — Y92410 Unspecified street and highway as the place of occurrence of the external cause: Secondary | ICD-10-CM | POA: Insufficient documentation

## 2013-03-01 LAB — POCT I-STAT, CHEM 8
Calcium, Ion: 1.09 mmol/L — ABNORMAL LOW (ref 1.13–1.30)
Chloride: 107 mEq/L (ref 96–112)
Glucose, Bld: 166 mg/dL — ABNORMAL HIGH (ref 70–99)
HCT: 44 % (ref 39.0–52.0)

## 2013-03-01 MED ORDER — KETOROLAC TROMETHAMINE 30 MG/ML IJ SOLN
30.0000 mg | Freq: Once | INTRAMUSCULAR | Status: AC
  Administered 2013-03-01: 30 mg via INTRAVENOUS
  Filled 2013-03-01: qty 1

## 2013-03-01 MED ORDER — PERCOCET 5-325 MG PO TABS: 1.0000 | ORAL_TABLET | Freq: Four times a day (QID) | ORAL | Status: DC | PRN

## 2013-03-01 MED ORDER — HYDROCODONE-ACETAMINOPHEN 5-325 MG PO TABS: 1.0000 | ORAL_TABLET | Freq: Four times a day (QID) | ORAL | Status: DC | PRN

## 2013-03-01 MED ORDER — OXYCODONE-ACETAMINOPHEN 5-325 MG PO TABS
1.0000 | ORAL_TABLET | Freq: Once | ORAL | Status: AC
  Administered 2013-03-01: 1 via ORAL
  Filled 2013-03-01: qty 1

## 2013-03-01 MED ORDER — IOHEXOL 300 MG/ML  SOLN
100.0000 mL | Freq: Once | INTRAMUSCULAR | Status: AC | PRN
  Administered 2013-03-01: 100 mL via INTRAVENOUS

## 2013-03-01 MED ORDER — SODIUM CHLORIDE 0.9 % IV SOLN
INTRAVENOUS | Status: DC
  Administered 2013-03-01: 11:00:00 via INTRAVENOUS

## 2013-03-01 NOTE — ED Notes (Signed)
Patient transported to X-ray 

## 2013-03-01 NOTE — ED Notes (Signed)
Instructed pt and family members on how to use incentive spirometer.  Pt return demonstrated without any issues: acheived 
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
## 2013-03-01 NOTE — ED Provider Notes (Signed)
Medical screening examination/treatment/procedure(s) were conducted as a shared visit with non-physician practitioner(s) and myself.  I personally evaluated the patient during the encounter  Isolated rib fracture.  Pain medications given.  Home with instructions for incentive spirometry and pain control.  Discharge home in good condition.  He understands return to ER for new or worsening symptoms  Lyanne Co, MD 03/01/13 [phone]
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
## 2013-03-15 ENCOUNTER — Encounter: Payer: Self-pay | Admitting: Cardiovascular Disease

## 2013-03-15 DIAGNOSIS — Z1211 Encounter for screening for malignant neoplasm of colon: Secondary | ICD-10-CM | POA: Diagnosis not present

## 2013-03-15 DIAGNOSIS — I119 Hypertensive heart disease without heart failure: Secondary | ICD-10-CM | POA: Diagnosis not present

## 2013-03-15 DIAGNOSIS — Z0000 Encounter for general adult medical examination without abnormal findings: Secondary | ICD-10-CM | POA: Diagnosis not present

## 2013-03-15 DIAGNOSIS — H9190 Unspecified hearing loss, unspecified ear: Secondary | ICD-10-CM | POA: Diagnosis not present

## 2013-03-15 DIAGNOSIS — I2510 Atherosclerotic heart disease of native coronary artery without angina pectoris: Secondary | ICD-10-CM | POA: Diagnosis not present

## 2013-03-15 DIAGNOSIS — M810 Age-related osteoporosis without current pathological fracture: Secondary | ICD-10-CM | POA: Diagnosis not present

## 2013-03-15 DIAGNOSIS — Z9181 History of falling: Secondary | ICD-10-CM | POA: Diagnosis not present

## 2013-03-15 DIAGNOSIS — E782 Mixed hyperlipidemia: Secondary | ICD-10-CM | POA: Diagnosis not present

## 2013-04-04 DIAGNOSIS — M810 Age-related osteoporosis without current pathological fracture: Secondary | ICD-10-CM | POA: Diagnosis not present

## 2013-04-04 DIAGNOSIS — I10 Essential (primary) hypertension: Secondary | ICD-10-CM | POA: Diagnosis not present

## 2013-04-04 DIAGNOSIS — I2510 Atherosclerotic heart disease of native coronary artery without angina pectoris: Secondary | ICD-10-CM | POA: Diagnosis not present

## 2013-04-04 DIAGNOSIS — Z9181 History of falling: Secondary | ICD-10-CM | POA: Diagnosis not present

## 2013-04-04 DIAGNOSIS — IMO0001 Reserved for inherently not codable concepts without codable children: Secondary | ICD-10-CM | POA: Diagnosis not present

## 2013-04-07 DIAGNOSIS — I2510 Atherosclerotic heart disease of native coronary artery without angina pectoris: Secondary | ICD-10-CM | POA: Diagnosis not present

## 2013-04-07 DIAGNOSIS — IMO0001 Reserved for inherently not codable concepts without codable children: Secondary | ICD-10-CM | POA: Diagnosis not present

## 2013-04-07 DIAGNOSIS — M810 Age-related osteoporosis without current pathological fracture: Secondary | ICD-10-CM | POA: Diagnosis not present

## 2013-04-07 DIAGNOSIS — I10 Essential (primary) hypertension: Secondary | ICD-10-CM | POA: Diagnosis not present

## 2013-04-07 DIAGNOSIS — Z9181 History of falling: Secondary | ICD-10-CM | POA: Diagnosis not present

## 2013-04-10 DIAGNOSIS — I2510 Atherosclerotic heart disease of native coronary artery without angina pectoris: Secondary | ICD-10-CM | POA: Diagnosis not present

## 2013-04-10 DIAGNOSIS — Z9181 History of falling: Secondary | ICD-10-CM | POA: Diagnosis not present

## 2013-04-10 DIAGNOSIS — IMO0001 Reserved for inherently not codable concepts without codable children: Secondary | ICD-10-CM | POA: Diagnosis not present

## 2013-04-10 DIAGNOSIS — M810 Age-related osteoporosis without current pathological fracture: Secondary | ICD-10-CM | POA: Diagnosis not present

## 2013-04-10 DIAGNOSIS — I10 Essential (primary) hypertension: Secondary | ICD-10-CM | POA: Diagnosis not present

## 2013-04-12 DIAGNOSIS — I10 Essential (primary) hypertension: Secondary | ICD-10-CM | POA: Diagnosis not present

## 2013-04-12 DIAGNOSIS — Z9181 History of falling: Secondary | ICD-10-CM | POA: Diagnosis not present

## 2013-04-12 DIAGNOSIS — I2510 Atherosclerotic heart disease of native coronary artery without angina pectoris: Secondary | ICD-10-CM | POA: Diagnosis not present

## 2013-04-12 DIAGNOSIS — M810 Age-related osteoporosis without current pathological fracture: Secondary | ICD-10-CM | POA: Diagnosis not present

## 2013-04-12 DIAGNOSIS — IMO0001 Reserved for inherently not codable concepts without codable children: Secondary | ICD-10-CM | POA: Diagnosis not present

## 2013-04-14 DIAGNOSIS — IMO0001 Reserved for inherently not codable concepts without codable children: Secondary | ICD-10-CM | POA: Diagnosis not present

## 2013-04-14 DIAGNOSIS — I10 Essential (primary) hypertension: Secondary | ICD-10-CM | POA: Diagnosis not present

## 2013-04-14 DIAGNOSIS — I2510 Atherosclerotic heart disease of native coronary artery without angina pectoris: Secondary | ICD-10-CM | POA: Diagnosis not present

## 2013-04-14 DIAGNOSIS — Z9181 History of falling: Secondary | ICD-10-CM | POA: Diagnosis not present

## 2013-04-14 DIAGNOSIS — M810 Age-related osteoporosis without current pathological fracture: Secondary | ICD-10-CM | POA: Diagnosis not present

## 2013-04-17 DIAGNOSIS — I2510 Atherosclerotic heart disease of native coronary artery without angina pectoris: Secondary | ICD-10-CM | POA: Diagnosis not present

## 2013-04-17 DIAGNOSIS — M810 Age-related osteoporosis without current pathological fracture: Secondary | ICD-10-CM | POA: Diagnosis not present

## 2013-04-17 DIAGNOSIS — IMO0001 Reserved for inherently not codable concepts without codable children: Secondary | ICD-10-CM | POA: Diagnosis not present

## 2013-04-17 DIAGNOSIS — Z9181 History of falling: Secondary | ICD-10-CM | POA: Diagnosis not present

## 2013-04-17 DIAGNOSIS — I10 Essential (primary) hypertension: Secondary | ICD-10-CM | POA: Diagnosis not present

## 2013-04-19 DIAGNOSIS — I2510 Atherosclerotic heart disease of native coronary artery without angina pectoris: Secondary | ICD-10-CM | POA: Diagnosis not present

## 2013-04-19 DIAGNOSIS — IMO0001 Reserved for inherently not codable concepts without codable children: Secondary | ICD-10-CM | POA: Diagnosis not present

## 2013-04-19 DIAGNOSIS — Z9181 History of falling: Secondary | ICD-10-CM | POA: Diagnosis not present

## 2013-04-19 DIAGNOSIS — M810 Age-related osteoporosis without current pathological fracture: Secondary | ICD-10-CM | POA: Diagnosis not present

## 2013-04-19 DIAGNOSIS — I10 Essential (primary) hypertension: Secondary | ICD-10-CM | POA: Diagnosis not present

## 2013-04-21 DIAGNOSIS — I2510 Atherosclerotic heart disease of native coronary artery without angina pectoris: Secondary | ICD-10-CM | POA: Diagnosis not present

## 2013-04-21 DIAGNOSIS — IMO0001 Reserved for inherently not codable concepts without codable children: Secondary | ICD-10-CM | POA: Diagnosis not present

## 2013-04-21 DIAGNOSIS — M810 Age-related osteoporosis without current pathological fracture: Secondary | ICD-10-CM | POA: Diagnosis not present

## 2013-04-21 DIAGNOSIS — Z9181 History of falling: Secondary | ICD-10-CM | POA: Diagnosis not present

## 2013-04-21 DIAGNOSIS — I10 Essential (primary) hypertension: Secondary | ICD-10-CM | POA: Diagnosis not present

## 2013-04-25 DIAGNOSIS — D239 Other benign neoplasm of skin, unspecified: Secondary | ICD-10-CM | POA: Diagnosis not present

## 2013-04-25 DIAGNOSIS — H61009 Unspecified perichondritis of external ear, unspecified ear: Secondary | ICD-10-CM | POA: Diagnosis not present

## 2013-04-25 DIAGNOSIS — L821 Other seborrheic keratosis: Secondary | ICD-10-CM | POA: Diagnosis not present

## 2013-08-08 ENCOUNTER — Other Ambulatory Visit: Payer: Self-pay | Admitting: Cardiovascular Disease

## 2013-08-08 DIAGNOSIS — Z23 Encounter for immunization: Secondary | ICD-10-CM | POA: Diagnosis not present

## 2013-09-05 DIAGNOSIS — H35369 Drusen (degenerative) of macula, unspecified eye: Secondary | ICD-10-CM | POA: Diagnosis not present

## 2013-09-05 DIAGNOSIS — H4720 Unspecified optic atrophy: Secondary | ICD-10-CM | POA: Diagnosis not present

## 2013-09-05 DIAGNOSIS — H2510 Age-related nuclear cataract, unspecified eye: Secondary | ICD-10-CM | POA: Diagnosis not present

## 2013-09-05 DIAGNOSIS — H31009 Unspecified chorioretinal scars, unspecified eye: Secondary | ICD-10-CM | POA: Diagnosis not present

## 2013-09-18 DIAGNOSIS — H539 Unspecified visual disturbance: Secondary | ICD-10-CM | POA: Diagnosis not present

## 2013-09-18 DIAGNOSIS — H2510 Age-related nuclear cataract, unspecified eye: Secondary | ICD-10-CM | POA: Diagnosis not present

## 2013-09-18 DIAGNOSIS — H33059 Total retinal detachment, unspecified eye: Secondary | ICD-10-CM | POA: Diagnosis not present

## 2013-09-18 DIAGNOSIS — H353190 Nonexudative age-related macular degeneration, unspecified eye, stage unspecified: Secondary | ICD-10-CM | POA: Diagnosis not present

## 2014-01-10 ENCOUNTER — Ambulatory Visit (INDEPENDENT_AMBULATORY_CARE_PROVIDER_SITE_OTHER): Payer: Medicare Other | Admitting: Cardiovascular Disease

## 2014-01-10 ENCOUNTER — Encounter: Payer: Self-pay | Admitting: Cardiovascular Disease

## 2014-01-10 VITALS — BP 136/84 | HR 80 | Ht 66.0 in | Wt 192.8 lb

## 2014-01-10 DIAGNOSIS — R0602 Shortness of breath: Secondary | ICD-10-CM | POA: Diagnosis not present

## 2014-01-10 DIAGNOSIS — I2510 Atherosclerotic heart disease of native coronary artery without angina pectoris: Secondary | ICD-10-CM | POA: Diagnosis not present

## 2014-01-10 DIAGNOSIS — I10 Essential (primary) hypertension: Secondary | ICD-10-CM | POA: Diagnosis not present

## 2014-01-10 DIAGNOSIS — E785 Hyperlipidemia, unspecified: Secondary | ICD-10-CM | POA: Diagnosis not present

## 2014-01-10 DIAGNOSIS — H269 Unspecified cataract: Secondary | ICD-10-CM

## 2014-01-10 DIAGNOSIS — K439 Ventral hernia without obstruction or gangrene: Secondary | ICD-10-CM

## 2014-01-10 NOTE — Assessment & Plan Note (Signed)
Not painful and reducable Advised him to not seek surgical opinion

## 2014-01-10 NOTE — Assessment & Plan Note (Signed)
Well controlled.  Continue current medications and low sodium Dash type diet.    

## 2014-01-10 NOTE — Patient Instructions (Signed)
Your physician wants you to follow-up in:  6 MONTHS WITH DR NISHAN  You will receive a reminder letter in the mail two months in advance. If you don't receive a letter, please call our office to schedule the follow-up appointment. Your physician recommends that you continue on your current medications as directed. Please refer to the Current Medication list given to you today. 

## 2014-01-10 NOTE — Assessment & Plan Note (Signed)
Stable with no angina and good activity level.  Continue medical Rx  

## 2014-01-10 NOTE — Assessment & Plan Note (Signed)
Appears that complications from his previous cataract surgery is rare.  He will not be able to renew liscence without fixing the left Advised him to proceed with this and discuss with Dr Zadie Rhine If he can't drive will not be able to stay in [place] by himself
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
## 2014-01-10 NOTE — Assessment & Plan Note (Addendum)
Cholesterol is at goal.  Continue current dose of statin and diet Rx.  No myalgias or side effects.  F/U  LFT's in 6 months. Lab Results  Component Value Date   LDLCALC 94 05/27/2010  Labs with primary             

## 2014-01-10 NOTE — Assessment & Plan Note (Signed)
Chronic Likely some restrictive lung disease from kyphosis.  Echo with normal EF  Observe

## 2014-01-18 ENCOUNTER — Telehealth: Payer: Self-pay | Admitting: Cardiovascular Disease

## 2014-01-18 NOTE — Telephone Encounter (Signed)
Returned call to patient's daughter Butch Penny she stated while father was eating dinner 01/17/14 he passed out.Stated before he passed out he started seeing black,no pain,no fast heart beat.Stated EMS came vital signs normal,blood sugar normal.Stated he has been having pain in left jaw when he chews food and gland left neck slightly swollen.Stated father saw Dr.Nishan recently and wanted to know if he needs to come back in to be seen.Stated father has been staying with her since 01/17/14 and he has been doing good.Message sent to [place] for advice.

## 2014-01-18 NOTE — Telephone Encounter (Signed)
New problem   Pt's daughter stated pt passed out 01/17/14 and she need to talk to nurse concerning this matter. Please call pt's daughter.

## 2014-01-18 NOTE — Telephone Encounter (Signed)
Doesn't sound like this was heart related. F/U primary regarding pain in jaw and swollen gland

## 2014-01-19 NOTE — Telephone Encounter (Signed)
Returned call to patient's daughter Thea Gist advised doesn't sound heart related.Advised to see PCP regarding pain in jaw and swollen gland.

## 2014-02-26 DIAGNOSIS — H33029 Retinal detachment with multiple breaks, unspecified eye: Secondary | ICD-10-CM | POA: Diagnosis not present

## 2014-02-26 DIAGNOSIS — H35369 Drusen (degenerative) of macula, unspecified eye: Secondary | ICD-10-CM | POA: Diagnosis not present

## 2014-02-26 DIAGNOSIS — H4720 Unspecified optic atrophy: Secondary | ICD-10-CM | POA: Diagnosis not present

## 2014-03-21 DIAGNOSIS — M810 Age-related osteoporosis without current pathological fracture: Secondary | ICD-10-CM | POA: Diagnosis not present

## 2014-03-21 DIAGNOSIS — I2510 Atherosclerotic heart disease of native coronary artery without angina pectoris: Secondary | ICD-10-CM | POA: Diagnosis not present

## 2014-03-21 DIAGNOSIS — Z0000 Encounter for general adult medical examination without abnormal findings: Secondary | ICD-10-CM | POA: Diagnosis not present

## 2014-03-21 DIAGNOSIS — Z23 Encounter for immunization: Secondary | ICD-10-CM | POA: Diagnosis not present

## 2014-03-21 DIAGNOSIS — I119 Hypertensive heart disease without heart failure: Secondary | ICD-10-CM | POA: Diagnosis not present

## 2014-03-21 DIAGNOSIS — H9190 Unspecified hearing loss, unspecified ear: Secondary | ICD-10-CM | POA: Diagnosis not present

## 2014-03-21 DIAGNOSIS — E782 Mixed hyperlipidemia: Secondary | ICD-10-CM | POA: Diagnosis not present

## 2014-03-21 DIAGNOSIS — R0602 Shortness of breath: Secondary | ICD-10-CM | POA: Diagnosis not present

## 2014-03-21 DIAGNOSIS — R040 Epistaxis: Secondary | ICD-10-CM | POA: Diagnosis not present

## 2014-04-30 DIAGNOSIS — H33059 Total retinal detachment, unspecified eye: Secondary | ICD-10-CM | POA: Diagnosis not present

## 2014-04-30 DIAGNOSIS — H539 Unspecified visual disturbance: Secondary | ICD-10-CM | POA: Diagnosis not present

## 2014-04-30 DIAGNOSIS — H2510 Age-related nuclear cataract, unspecified eye: Secondary | ICD-10-CM | POA: Diagnosis not present

## 2014-04-30 DIAGNOSIS — H02839 Dermatochalasis of unspecified eye, unspecified eyelid: Secondary | ICD-10-CM | POA: Diagnosis not present

## 2014-05-02 DIAGNOSIS — M810 Age-related osteoporosis without current pathological fracture: Secondary | ICD-10-CM | POA: Diagnosis not present

## 2014-05-03 DIAGNOSIS — H2510 Age-related nuclear cataract, unspecified eye: Secondary | ICD-10-CM | POA: Diagnosis not present

## 2014-05-09 DIAGNOSIS — H269 Unspecified cataract: Secondary | ICD-10-CM | POA: Diagnosis not present

## 2014-05-09 DIAGNOSIS — Z87891 Personal history of nicotine dependence: Secondary | ICD-10-CM | POA: Diagnosis not present

## 2014-05-09 DIAGNOSIS — Z7982 Long term (current) use of aspirin: Secondary | ICD-10-CM | POA: Diagnosis not present

## 2014-05-09 DIAGNOSIS — Z882 Allergy status to sulfonamides status: Secondary | ICD-10-CM | POA: Diagnosis not present

## 2014-05-09 DIAGNOSIS — Z951 Presence of aortocoronary bypass graft: Secondary | ICD-10-CM | POA: Diagnosis not present

## 2014-05-09 DIAGNOSIS — K219 Gastro-esophageal reflux disease without esophagitis: Secondary | ICD-10-CM | POA: Diagnosis not present

## 2014-05-09 DIAGNOSIS — M810 Age-related osteoporosis without current pathological fracture: Secondary | ICD-10-CM | POA: Diagnosis not present

## 2014-05-09 DIAGNOSIS — H539 Unspecified visual disturbance: Secondary | ICD-10-CM | POA: Diagnosis not present

## 2014-05-09 DIAGNOSIS — Z79899 Other long term (current) drug therapy: Secondary | ICD-10-CM | POA: Diagnosis not present

## 2014-05-09 DIAGNOSIS — H5440 Blindness, one eye, unspecified eye: Secondary | ICD-10-CM | POA: Diagnosis not present

## 2014-05-09 DIAGNOSIS — H2510 Age-related nuclear cataract, unspecified eye: Secondary | ICD-10-CM | POA: Diagnosis not present

## 2014-05-09 DIAGNOSIS — I10 Essential (primary) hypertension: Secondary | ICD-10-CM | POA: Diagnosis not present

## 2014-05-09 DIAGNOSIS — E785 Hyperlipidemia, unspecified: Secondary | ICD-10-CM | POA: Diagnosis not present

## 2014-08-06 ENCOUNTER — Ambulatory Visit: Payer: No Typology Code available for payment source | Admitting: Cardiovascular Disease

## 2014-08-10 DIAGNOSIS — Z23 Encounter for immunization: Secondary | ICD-10-CM | POA: Diagnosis not present

## 2014-09-03 DIAGNOSIS — H4720 Unspecified optic atrophy: Secondary | ICD-10-CM | POA: Diagnosis not present

## 2014-09-03 DIAGNOSIS — H35363 Drusen (degenerative) of macula, bilateral: Secondary | ICD-10-CM | POA: Diagnosis not present

## 2014-09-03 DIAGNOSIS — H33021 Retinal detachment with multiple breaks, right eye: Secondary | ICD-10-CM | POA: Diagnosis not present

## 2014-09-05 ENCOUNTER — Ambulatory Visit (INDEPENDENT_AMBULATORY_CARE_PROVIDER_SITE_OTHER): Payer: Medicare Other | Admitting: Cardiovascular Disease

## 2014-09-05 ENCOUNTER — Encounter: Payer: Self-pay | Admitting: Cardiovascular Disease

## 2014-09-05 VITALS — BP 126/82 | HR 81 | Ht 66.0 in | Wt 194.0 lb

## 2014-09-05 DIAGNOSIS — I2510 Atherosclerotic heart disease of native coronary artery without angina pectoris: Secondary | ICD-10-CM

## 2014-09-05 DIAGNOSIS — E785 Hyperlipidemia, unspecified: Secondary | ICD-10-CM

## 2014-09-05 DIAGNOSIS — I10 Essential (primary) hypertension: Secondary | ICD-10-CM

## 2014-09-05 DIAGNOSIS — R0602 Shortness of breath: Secondary | ICD-10-CM

## 2014-09-05 DIAGNOSIS — I2583 Coronary atherosclerosis due to lipid rich plaque: Secondary | ICD-10-CM

## 2014-09-05 MED ORDER — ALBUTEROL SULFATE HFA 108 (90 BASE) MCG/ACT IN AERS
2.0000 | INHALATION_SPRAY | Freq: Four times a day (QID) | RESPIRATORY_TRACT | Status: DC | PRN
Start: 2014-09-05 — End: 2015-07-19

## 2014-09-05 NOTE — Assessment & Plan Note (Signed)
Stable with no angina and good activity level.  Continue medical Rx  

## 2014-09-05 NOTE — Assessment & Plan Note (Signed)
Well controlled.  Continue current medications and low sodium Dash type diet.    

## 2014-09-05 NOTE — Patient Instructions (Signed)
Your physician wants you to follow-up in:  6 MONTHS WITH DR NISHAN  You will receive a reminder letter in the mail two months in advance. If you don't receive a letter, please call our office to schedule the follow-up appointment. Your physician recommends that you continue on your current medications as directed. Please refer to the Current Medication list given to you today. 

## 2014-09-05 NOTE — Progress Notes (Signed)
Patient ID: [name], male   DOB: 1929/09/20, 80 y.o.   MRN: [id number] [name] is seen today for F/U CAD with CABG in 1999. He had a normal myovue in 2008. He denies SSCP or angina. He still lives alone in [place]. His daughter and son in law look in on him frequently and take good care of him. He still seems depressed since his wife died a few years ago. He decided not to get another dog since "lucky" died. He has had some wheezing and dyspnea. Last time I saw him with this complaint he had allergies and some bronchospastic disease. Inhalers helped and he needs to restart this. His primary Doctor Brigitte Pulse has done a CXR on him recently and it was "ok". He also gets some dyspnea on bending over which is funcitonal. His son in law [name] was with him today.  Echo 1/13 Ok with EF 55%  Impressions:  - Normal LV size with mild LV hypertrophy. There was also basal septal hypertrophy. EF 35% (normal systolic function). Moderate left atrial enlargement. Normal RV size with mildly reduced RV systolic function. No significant valvular abnormalities.  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  All other systems reviewed and negative  General: Affect appropriate Healthy:  appears stated age 8: normal Neck supple with no adenopathy JVP normal no bruits no thyromegaly Lungs clear with no wheezing and good diaphragmatic motion Heart:  S1/S2 no murmur, no rub, gallop or click PMI normal Abdomen: benighn, BS positve, no tenderness, no AAA no bruit.  No HSM or HJR Distal pulses intact with no bruits No  edema Neuro non-focal Skin warm and dry No muscular weakness   Current Outpatient Prescriptions  Medication Sig Dispense Refill  . aspirin EC 81 MG tablet Take 81 mg by mouth daily.      Marland Kitchen atorvastatin (LIPITOR) 20 MG tablet Take 20 mg by mouth daily.       . B Complex-C (B-COMPLEX WITH VITAMIN C) tablet Take 1 tablet by mouth daily.      . cholecalciferol (VITAMIN D) 1000 UNITS tablet Take 1,000 Units by mouth daily.       Marland Kitchen omega-3 acid ethyl esters (LOVAZA) 1 G capsule Take 1 g by mouth daily.      . ramipril (ALTACE) 5 MG capsule Take 5 mg by mouth daily.      . ranitidine (ZANTAC) 150 MG tablet Take 150 mg by mouth at bedtime.      . vitamin C (ASCORBIC ACID) 500 MG tablet Take 500 mg by mouth daily.       Marland Kitchen PROAIR HFA 108 (90 BASE) MCG/ACT inhaler USE 1 TO 2 PUFFS EVERY 4-6 HOURS AS NEEDED  8.5 each  1   No current facility-administered medications for this visit.    Allergies  Sulfonamide derivatives  Electrocardiogram: SR rate 78 LAD LVH ? Old anterior MI   Assessment and Plan

## 2014-09-05 NOTE — Assessment & Plan Note (Signed)
Cholesterol is at goal.  Continue current dose of statin and diet Rx.  No myalgias or side effects.  F/U  LFT's in 6 months. Lab Results  Component Value Date   LDLCALC 94 05/27/2010  Labs with primary

## 2014-09-05 NOTE — Assessment & Plan Note (Signed)
Mild asthma  Called in albuterol inhaler to [facility]

## 2015-02-25 DIAGNOSIS — L299 Pruritus, unspecified: Secondary | ICD-10-CM | POA: Diagnosis not present

## 2015-02-25 DIAGNOSIS — D239 Other benign neoplasm of skin, unspecified: Secondary | ICD-10-CM | POA: Diagnosis not present
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
## 2015-03-06 ENCOUNTER — Ambulatory Visit: Payer: No Typology Code available for payment source | Admitting: Cardiovascular Disease

## 2015-03-27 DIAGNOSIS — Z0001 Encounter for general adult medical examination with abnormal findings: Secondary | ICD-10-CM | POA: Diagnosis not present

## 2015-03-27 DIAGNOSIS — I2510 Atherosclerotic heart disease of native coronary artery without angina pectoris: Secondary | ICD-10-CM | POA: Diagnosis not present

## 2015-03-27 DIAGNOSIS — H9193 Unspecified hearing loss, bilateral: Secondary | ICD-10-CM | POA: Diagnosis not present

## 2015-03-27 DIAGNOSIS — K458 Other specified abdominal hernia without obstruction or gangrene: Secondary | ICD-10-CM | POA: Diagnosis not present

## 2015-03-27 DIAGNOSIS — M8000XA Age-related osteoporosis with current pathological fracture, unspecified site, initial encounter for fracture: Secondary | ICD-10-CM | POA: Diagnosis not present

## 2015-03-27 DIAGNOSIS — E782 Mixed hyperlipidemia: Secondary | ICD-10-CM | POA: Diagnosis not present

## 2015-03-27 DIAGNOSIS — R231 Pallor: Secondary | ICD-10-CM | POA: Diagnosis not present

## 2015-03-27 DIAGNOSIS — I119 Hypertensive heart disease without heart failure: Secondary | ICD-10-CM | POA: Diagnosis not present

## 2015-03-28 ENCOUNTER — Encounter: Payer: Self-pay | Admitting: Cardiovascular Disease

## 2015-05-08 DIAGNOSIS — H539 Unspecified visual disturbance: Secondary | ICD-10-CM | POA: Diagnosis not present

## 2015-05-08 DIAGNOSIS — H5441 Blindness, right eye, normal vision left eye: Secondary | ICD-10-CM | POA: Diagnosis not present

## 2015-05-08 DIAGNOSIS — H33051 Total retinal detachment, right eye: Secondary | ICD-10-CM | POA: Diagnosis not present

## 2015-05-08 DIAGNOSIS — H26492 Other secondary cataract, left eye: Secondary | ICD-10-CM | POA: Diagnosis not present

## 2015-05-21 NOTE — Progress Notes (Signed)
Patient ID: [name], male   DOB: 02-17-1929, 81 y.o.   MRN: [id number] Ash is seen today for F/U CAD with CABG in 1999. He had a normal myovue in 2008. He denies SSCP or angina. He still lives alone in [place]. His daughter and son in law look in on him frequently and take good care of him. He still seems depressed since his wife died a few years ago. He decided not to get another dog since "lucky" died. He has had some wheezing and dyspnea. Last time I saw him with this complaint he had allergies and some bronchospastic disease. Inhalers helped and he needs to restart this. His primary Doctor Brigitte Pulse has done a CXR on him recently and it was "ok". He also gets some dyspnea on bending over which is funcitonal. His son in law [name] was with him today.   Echo 1/13 Ok with EF 55%  Impressions:  - Normal LV size with mild LV hypertrophy. There was also basal septal hypertrophy. EF 78% (normal systolic function). Moderate left atrial enlargement. Normal RV size with mildly reduced RV systolic function. No significant valvular abnormalities.  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  Picked up a Statistician and concerns about falling and tripping with him   ROS: Denies fever, malais, weight loss, blurry vision, decreased visual acuity, cough, sputum, SOB, hemoptysis, pleuritic pain, palpitaitons, heartburn, abdominal pain, melena, lower extremity edema, claudication, or rash.  All other systems reviewed and negative  General: Affect appropriate Healthy:  appears stated age 78: normal Neck supple with no adenopathy JVP normal no bruits no thyromegaly Lungs clear with no wheezing and good diaphragmatic motion Heart:  S1/S2 no murmur, no rub, gallop or click PMI  normal Abdomen: benighn, BS positve, no tenderness, no AAA no bruit.  No HSM or HJR Distal pulses intact with no bruits No edema Neuro non-focal Skin warm and dry No muscular weakness   Current Outpatient Prescriptions  Medication Sig Dispense Refill  . albuterol (PROVENTIL HFA;VENTOLIN HFA) 108 (90 BASE) MCG/ACT inhaler Inhale 2 puffs into the lungs every 6 (six) hours as needed for wheezing or shortness of breath. 1 Inhaler 4  . aspirin EC 81 MG tablet Take 81 mg by mouth daily.    Marland Kitchen atorvastatin (LIPITOR) 20 MG tablet Take 20 mg by mouth daily.     . B Complex-C (B-COMPLEX WITH VITAMIN C) tablet Take 1 tablet by mouth daily.    . cholecalciferol (VITAMIN D) 1000 UNITS tablet Take 1,000 Units by mouth daily.     Marland Kitchen omega-3 acid ethyl esters (LOVAZA) 1 G capsule Take 1 g by mouth daily.    Marland Kitchen PROAIR HFA 108 (90 BASE) MCG/ACT inhaler USE 1 TO 2 PUFFS EVERY 4-6 HOURS AS NEEDED 8.5 each 1  . ramipril (ALTACE) 5 MG capsule Take 5 mg by mouth daily.    . ranitidine (ZANTAC) 150 MG tablet Take 150 mg by mouth at bedtime.    . vitamin C (ASCORBIC ACID) 500 MG tablet Take 500 mg by mouth daily.      No current facility-administered medications for this visit.    Allergies  Sulfonamide derivatives  Electrocardiogram:  09/05/14  SR rate 78 LAD LVH ? Old anterior MI  05/22/15  SR PAC LAD normal ST segments rate 72  Assessment and Plan CAD:  Stable with no angina and good activity level.  Continue medical Rx  Asthma:  PRN inhaler no active wheezing   HTN  Well controlled.  Continue current medications and low sodium Dash type diet.    Chol  Cholesterol is at goal.  Continue current dose of statin and diet Rx.  No myalgias or side effects.  F/U  LFT's in 6 months. Labs with primary  Neuro:  Broad based gait increased difficulty ambulating May be age/orthopedic but will refer to neuro for evaluation   F/U with me in 6 months

## 2015-05-22 ENCOUNTER — Encounter: Payer: Self-pay | Admitting: Cardiovascular Disease

## 2015-05-22 ENCOUNTER — Ambulatory Visit (INDEPENDENT_AMBULATORY_CARE_PROVIDER_SITE_OTHER): Payer: Medicare Other | Admitting: Cardiovascular Disease

## 2015-05-22 VITALS — BP 120/72 | HR 72 | Ht 66.0 in | Wt 187.8 lb

## 2015-05-22 DIAGNOSIS — I2510 Atherosclerotic heart disease of native coronary artery without angina pectoris: Secondary | ICD-10-CM | POA: Diagnosis not present

## 2015-05-22 DIAGNOSIS — I2583 Coronary atherosclerosis due to lipid rich plaque: Principal | ICD-10-CM

## 2015-05-22 NOTE — Patient Instructions (Addendum)
Medication Instructions:  NO CHANGES   Labwork: NONE Testing/Procedures: NONE  Follow-Up: Your physician wants you to follow-up in:  [place] will receive a reminder letter in the mail two months in advance. If you don't receive a letter, please call our office to schedule the follow-up appointment.    You have been referred to DR  TAT  GAIT  DISORDER  Any Other Special Instructions Will Be Listed Below (If Applicable).

## 2015-06-18 DIAGNOSIS — H35363 Drusen (degenerative) of macula, bilateral: Secondary | ICD-10-CM | POA: Diagnosis not present

## 2015-06-18 DIAGNOSIS — H4720 Unspecified optic atrophy: Secondary | ICD-10-CM | POA: Diagnosis not present

## 2015-06-18 DIAGNOSIS — H26492 Other secondary cataract, left eye: Secondary | ICD-10-CM | POA: Diagnosis not present

## 2015-07-19 ENCOUNTER — Encounter: Payer: Self-pay | Admitting: Neurology

## 2015-07-19 ENCOUNTER — Ambulatory Visit (INDEPENDENT_AMBULATORY_CARE_PROVIDER_SITE_OTHER): Payer: Medicare Other | Admitting: Neurology

## 2015-07-19 VITALS — BP 110/78 | HR 81 | Ht 66.0 in | Wt 190.1 lb

## 2015-07-19 DIAGNOSIS — G609 Hereditary and idiopathic neuropathy, unspecified: Secondary | ICD-10-CM | POA: Diagnosis not present

## 2015-07-19 DIAGNOSIS — I2510 Atherosclerotic heart disease of native coronary artery without angina pectoris: Secondary | ICD-10-CM

## 2015-07-19 DIAGNOSIS — M4806 Spinal stenosis, lumbar region: Secondary | ICD-10-CM | POA: Diagnosis not present

## 2015-07-19 DIAGNOSIS — R269 Unspecified abnormalities of gait and mobility: Secondary | ICD-10-CM | POA: Diagnosis not present

## 2015-07-19 DIAGNOSIS — M48061 Spinal stenosis, lumbar region without neurogenic claudication: Secondary | ICD-10-CM

## 2015-07-19 DIAGNOSIS — R2689 Other abnormalities of gait and mobility: Secondary | ICD-10-CM | POA: Diagnosis not present

## 2015-07-19 LAB — VITAMIN B12: Vitamin B-12: 958 pg/mL — ABNORMAL HIGH (ref 211–911)
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
## 2015-07-19 NOTE — Patient Instructions (Addendum)
1.  Check blood work 2.  Start out-patient physical therapy for gait training 3.  Start using a cane and consider a rollator for long distances 4.  Return to clinic as needed   [facility]  Preventing Falls in the [place] are common, often dreaded events in the lives of older people. Aside from the obvious injuries and even death that may result, falls can cause wide-ranging consequences including loss of independence, mental decline, decreased activity, and mobility. Younger people are also at risk of falling, especially those with chronic illnesses and fatigue.  Ways to reduce the risk for falling:  * Examine diet and medications. Warm foods and alcohol dilate blood vessels, which can lead to dizziness when standing. Sleep aids, antidepressants, and pain medications can also increase the likelihood of a fall.  * Get a vison exam. Poor vision, cataracts, and glaucoma increase the chances of falling.  * Check foot gear. Shoes should fit snugly and have a sturdy, nonskid sole and broad, low heel.  * Participate in a physician-approved exercise program to build and maintain muscle strength and improve balance and coordination.  * Increase vitamin D intake. Vitamin D improves muscle strength and increases the amount of calcium the body is able to absorb and deposit in bones.  How to prevent falls from common hazards:  * Floors - Remove all loose wires, cords, and throw rugs. Minimize clutter. Make sure rugs are anchored and smooth. Keep furniture in its usual place.  * Chairs - Use chairs with straight backs, armrests, and firm seats. Add firm cushions to existing pieces to add height.  * Bathroom - Install grab bars and non-skid tape in the tub or shower. Use a bathtub transfer bench or a shower chair with a back support. Use an elevated toilet seat and/or safety rails to assist standing from a low surface. Do not use towel racks or bathroom tissue holders to help you stand.  * Lighting  - Make sure halls, stairways, and entrances are well-lit. Install a night light in your bathroom or hallway. Make sure there is a light switch at the top and bottom of the staircase. Turn lights on if you get up in the middle of the night. Make sure lamps or light switches are within reach of the bed if you have to get up during the night.  * Kitchen - Install non-skid rubber mats near the sink and stove. Clean spills immediately. Store frequently used utensils, pots, and pans between waist and eye level. This helps prevent reaching and bending. Sit when getting things out of the lower cupboards.  * Living room / [place] furniture with wide spaces in between, giving enough room to move around. Establish a route through the living room that gives you something to hold onto as you walk.  * Stairs - Make sure treads, rails, and rugs are secure. Install a rail on both sides of the stairs. If stairs are a threat, it might be helpful to arrange most of your activities on the lower level to reduce the number of times you must climb the stairs.  * Entrances and doorways - Install metal handles on the walls adjacent to the doorknobs of all doors to make it more secure as you travel through the doorway.  Tips for maintaining balance:  * Keep at least one hand free at all times Try using a backpack or fanny pack to hold things rather than carrying them in your hands.  Never carry objects in both hands when walking as this interferes with keeping your balance.  * Attempt to swing both arms from front to back while walking. This might require a conscious effort if Parkinson's disease has diminished your movement. It will, however, help you to maintain balance and posture, and reduce fatigue.  * Consciously lift your feet off the ground when walking. Shuffling and dragging of the feet is a common culprit in losing your balance.  * When trying to navigate turns, use a "U" technique of facing forward and making a  wide turn, rather than pivoting sharply.  * Try to stand with your feet shoulder-length apart. When your feet are close together for any length of time, you increase your risk of losing your balance and falling.  * Do one thing at a time. Do not try to walk and accomplish another task, such as reading or looking around. The decrease in your automatic reflexes complicates motor function, so the less distraction, the better.  * Do not wear rubber or gripping soled shoes, they might "catch" on the floor and cause tripping.  * Move slowly when changing positions. Use deliberate, concentrated movements and, if needed, use a grab bar or walking aid. Count fifteen (15) seconds after standing to begin walking.  * If balance is a continuous problem, you might want to consider a walking aid such as a cane, walking stick, or walker. Once you have mastered walking with help, you may be ready to try it again on your own.  This information is provided by [facility] and is not intended to replace the medical advice of your physician or other health care providers. Please consult your physician or other health care providers for advice regarding your specific medical condition.

## 2015-07-21 LAB — COPPER, SERUM: COPPER: 90 ug/dL (ref 70–175)

## 2015-07-22 NOTE — Progress Notes (Signed)
Note routed

## 2015-07-26 ENCOUNTER — Telehealth: Payer: Self-pay | Admitting: Neurology

## 2015-07-26 NOTE — Telephone Encounter (Signed)
Called patient's daughter back and she said that she has not heard from PT in [place].  Informed her that I will check on that and let her know.

## 2015-07-26 NOTE — Telephone Encounter (Signed)
Pt/daughter/Donna Martinique called for info on Rehab in [place], [place]/ call back @ [phone]

## 2015-07-29 NOTE — Telephone Encounter (Signed)
New referral faxed to [facility].

## 2015-08-01 DIAGNOSIS — G609 Hereditary and idiopathic neuropathy, unspecified: Secondary | ICD-10-CM | POA: Diagnosis not present

## 2015-08-01 DIAGNOSIS — R2689 Other abnormalities of gait and mobility: Secondary | ICD-10-CM | POA: Diagnosis not present

## 2015-08-05 DIAGNOSIS — R2689 Other abnormalities of gait and mobility: Secondary | ICD-10-CM | POA: Diagnosis not present

## 2015-08-05 DIAGNOSIS — G609 Hereditary and idiopathic neuropathy, unspecified: Secondary | ICD-10-CM | POA: Diagnosis not present

## 2015-08-09 DIAGNOSIS — R2689 Other abnormalities of gait and mobility: Secondary | ICD-10-CM | POA: Diagnosis not present

## 2015-08-09 DIAGNOSIS — G609 Hereditary and idiopathic neuropathy, unspecified: Secondary | ICD-10-CM | POA: Diagnosis not present

## 2015-08-13 DIAGNOSIS — G609 Hereditary and idiopathic neuropathy, unspecified: Secondary | ICD-10-CM | POA: Diagnosis not present

## 2015-08-13 DIAGNOSIS — R2689 Other abnormalities of gait and mobility: Secondary | ICD-10-CM | POA: Diagnosis not present

## 2015-08-16 DIAGNOSIS — R2689 Other abnormalities of gait and mobility: Secondary | ICD-10-CM | POA: Diagnosis not present

## 2015-08-16 DIAGNOSIS — G609 Hereditary and idiopathic neuropathy, unspecified: Secondary | ICD-10-CM | POA: Diagnosis not present

## 2015-08-19 DIAGNOSIS — R2689 Other abnormalities of gait and mobility: Secondary | ICD-10-CM | POA: Diagnosis not present

## 2015-08-19 DIAGNOSIS — G609 Hereditary and idiopathic neuropathy, unspecified: Secondary | ICD-10-CM | POA: Diagnosis not present

## 2015-08-21 DIAGNOSIS — G609 Hereditary and idiopathic neuropathy, unspecified: Secondary | ICD-10-CM | POA: Diagnosis not present

## 2015-08-21 DIAGNOSIS — R2689 Other abnormalities of gait and mobility: Secondary | ICD-10-CM | POA: Diagnosis not present

## 2015-08-22 DIAGNOSIS — Z23 Encounter for immunization: Secondary | ICD-10-CM | POA: Diagnosis not present

## 2015-08-26 DIAGNOSIS — G609 Hereditary and idiopathic neuropathy, unspecified: Secondary | ICD-10-CM | POA: Diagnosis not present

## 2015-08-26 DIAGNOSIS — R2689 Other abnormalities of gait and mobility: Secondary | ICD-10-CM | POA: Diagnosis not present

## 2015-08-29 DIAGNOSIS — G609 Hereditary and idiopathic neuropathy, unspecified: Secondary | ICD-10-CM | POA: Diagnosis not present

## 2015-08-29 DIAGNOSIS — R2689 Other abnormalities of gait and mobility: Secondary | ICD-10-CM | POA: Diagnosis not present

## 2015-09-02 DIAGNOSIS — G609 Hereditary and idiopathic neuropathy, unspecified: Secondary | ICD-10-CM | POA: Diagnosis not present

## 2015-09-02 DIAGNOSIS — R2689 Other abnormalities of gait and mobility: Secondary | ICD-10-CM | POA: Diagnosis not present

## 2015-09-05 DIAGNOSIS — R2689 Other abnormalities of gait and mobility: Secondary | ICD-10-CM | POA: Diagnosis not present

## 2015-09-05 DIAGNOSIS — G609 Hereditary and idiopathic neuropathy, unspecified: Secondary | ICD-10-CM | POA: Diagnosis not present

## 2015-09-11 DIAGNOSIS — R2689 Other abnormalities of gait and mobility: Secondary | ICD-10-CM | POA: Diagnosis not present

## 2015-09-11 DIAGNOSIS — G609 Hereditary and idiopathic neuropathy, unspecified: Secondary | ICD-10-CM | POA: Diagnosis not present

## 2015-09-13 DIAGNOSIS — G609 Hereditary and idiopathic neuropathy, unspecified: Secondary | ICD-10-CM | POA: Diagnosis not present

## 2015-09-13 DIAGNOSIS — R2689 Other abnormalities of gait and mobility: Secondary | ICD-10-CM | POA: Diagnosis not present

## 2015-09-18 DIAGNOSIS — G609 Hereditary and idiopathic neuropathy, unspecified: Secondary | ICD-10-CM | POA: Diagnosis not present

## 2015-09-18 DIAGNOSIS — R2689 Other abnormalities of gait and mobility: Secondary | ICD-10-CM | POA: Diagnosis not present

## 2015-09-20 DIAGNOSIS — G609 Hereditary and idiopathic neuropathy, unspecified: Secondary | ICD-10-CM | POA: Diagnosis not present

## 2015-09-20 DIAGNOSIS — R2689 Other abnormalities of gait and mobility: Secondary | ICD-10-CM | POA: Diagnosis not present

## 2015-09-23 DIAGNOSIS — G609 Hereditary and idiopathic neuropathy, unspecified: Secondary | ICD-10-CM | POA: Diagnosis not present

## 2015-09-23 DIAGNOSIS — R2689 Other abnormalities of gait and mobility: Secondary | ICD-10-CM | POA: Diagnosis not present

## 2015-09-25 DIAGNOSIS — R2689 Other abnormalities of gait and mobility: Secondary | ICD-10-CM | POA: Diagnosis not present

## 2015-09-25 DIAGNOSIS — G609 Hereditary and idiopathic neuropathy, unspecified: Secondary | ICD-10-CM | POA: Diagnosis not present

## 2015-11-22 ENCOUNTER — Ambulatory Visit: Payer: PRIVATE HEALTH INSURANCE | Admitting: Cardiovascular Disease

## 2015-12-31 NOTE — Progress Notes (Signed)
Patient ID: [name], male   DOB: 1929/05/09, 82 y.o.   MRN: [id number]   [name] is seen today for F/U CAD with CABG in 1999. He had a normal myovue in 2008. He denies SSCP or angina. He still lives alone in [place]. His daughter and son in law look in on him frequently and take good care of him. He still seems depressed since his wife died a few years ago. He decided not to get another dog since "lucky" died. He has had some wheezing and dyspnea. Last time I saw him with this complaint he had allergies and some bronchospastic disease. Inhalers helped and he needs to restart this. His primary Doctor [name] has done a CXR on him recently and it was "ok". He also gets some dyspnea on bending over which is funcitonal. His son in law [name] was with him today.   Echo 1/13 Ok with EF 55%  Impressions:  - Normal LV size with mild LV hypertrophy. There was also basal septal hypertrophy. EF [id number] (normal systolic function). Moderate left atrial enlargement. Normal RV size with mildly reduced RV systolic function. No significant valvular abnormalities.  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  Picked up a Statistician and concerns about falling and tripping with him  Seen by [name] in neurology and thought arthritic spine disease and neuropathy contributing not parkinson's   Had mild bout of what sounds like diverticulitis and lost some weight Better now  ROS: Denies fever, malais, weight loss, blurry vision, decreased visual acuity, cough, sputum, SOB, hemoptysis, pleuritic pain, palpitaitons, heartburn, abdominal pain, melena, lower extremity edema, claudication, or rash.  All other systems reviewed and negative  General: Affect appropriate Healthy:  appears stated age 82:  normal Neck supple with no adenopathy JVP normal no bruits no thyromegaly Lungs clear with no wheezing and good diaphragmatic motion Heart:  S1/S2 no murmur, no rub, gallop or click PMI normal Abdomen: benighn, BS positve, no tenderness, no AAA no bruit.  No HSM or HJR Distal pulses intact with no bruits No edema Neuro non-focal Skin warm and dry No muscular weakness   Current Outpatient Prescriptions  Medication Sig Dispense Refill  . albuterol (PROVENTIL HFA;VENTOLIN HFA) 108 (90 Base) MCG/ACT inhaler Inhale 1-2 puffs into the lungs every 6 (six) hours as needed for wheezing or shortness of breath.    Marland Kitchen aspirin EC 81 MG tablet Take 81 mg by mouth daily.    Marland Kitchen atorvastatin (LIPITOR) 20 MG tablet Take 20 mg by mouth daily.     . B Complex-C (B-COMPLEX WITH VITAMIN C) tablet Take 1 tablet by mouth daily.    . cholecalciferol (VITAMIN D) 1000 UNITS tablet Take 1,000 Units by mouth daily.     . Esomeprazole Magnesium (NEXIUM PO) Take 1 tablet by mouth daily.    Marland Kitchen omega-3 acid ethyl esters (LOVAZA) 1 G capsule Take 1 g by mouth daily.    . ramipril (ALTACE) 5 MG capsule Take 5 mg by mouth daily.    . vitamin C (ASCORBIC ACID) 500 MG tablet Take 500 mg by mouth daily.      No current facility-administered medications for this visit.    Allergies  Sulfonamide derivatives  Electrocardiogram:  09/05/14  SR rate  55 LAD LVH ? Old anterior MI  05/22/15  SR PAC LAD normal ST segments rate 72  Assessment and Plan CAD:  Stable with no angina and good activity level.  Continue medical Rx  Asthma:  PRN inhaler no active wheezing   HTN  Well controlled.  Continue current medications and low sodium Dash type diet.    Chol  Cholesterol is at goal.  Continue current dose of statin and diet Rx.  No myalgias or side effects.  F/U  LFT's in 6 months. Labs with primary  Neuro:   PT/ gait training f/u [name]   GI:  Had prilosec for 2 weeks now better discussed low fiber/nut diet   F/U with me  in 6 months

## 2016-01-06 ENCOUNTER — Encounter: Payer: Self-pay | Admitting: Cardiovascular Disease

## 2016-01-06 ENCOUNTER — Ambulatory Visit (INDEPENDENT_AMBULATORY_CARE_PROVIDER_SITE_OTHER): Payer: Medicare Other | Admitting: Cardiovascular Disease

## 2016-01-06 VITALS — BP 150/60 | HR 75 | Ht 66.0 in | Wt 187.8 lb

## 2016-01-06 DIAGNOSIS — I2510 Atherosclerotic heart disease of native coronary artery without angina pectoris: Secondary | ICD-10-CM | POA: Diagnosis not present

## 2016-01-06 DIAGNOSIS — I2583 Coronary atherosclerosis due to lipid rich plaque: Principal | ICD-10-CM

## 2016-01-06 NOTE — Patient Instructions (Signed)

## 2016-02-12 DIAGNOSIS — L281 Prurigo nodularis: Secondary | ICD-10-CM | POA: Diagnosis not present

## 2016-02-12 DIAGNOSIS — D239 Other benign neoplasm of skin, unspecified: Secondary | ICD-10-CM | POA: Diagnosis not present

## 2016-04-30 DIAGNOSIS — M810 Age-related osteoporosis without current pathological fracture: Secondary | ICD-10-CM | POA: Diagnosis not present

## 2016-04-30 DIAGNOSIS — E782 Mixed hyperlipidemia: Secondary | ICD-10-CM | POA: Diagnosis not present

## 2016-04-30 DIAGNOSIS — Z0000 Encounter for general adult medical examination without abnormal findings: Secondary | ICD-10-CM | POA: Diagnosis not present

## 2016-04-30 DIAGNOSIS — H9193 Unspecified hearing loss, bilateral: Secondary | ICD-10-CM | POA: Diagnosis not present

## 2016-04-30 DIAGNOSIS — I119 Hypertensive heart disease without heart failure: Secondary | ICD-10-CM | POA: Diagnosis not present

## 2016-04-30 DIAGNOSIS — K458 Other specified abdominal hernia without obstruction or gangrene: Secondary | ICD-10-CM | POA: Diagnosis not present

## 2016-04-30 DIAGNOSIS — I2510 Atherosclerotic heart disease of native coronary artery without angina pectoris: Secondary | ICD-10-CM | POA: Diagnosis not present

## 2016-05-11 DIAGNOSIS — H02831 Dermatochalasis of right upper eyelid: Secondary | ICD-10-CM | POA: Diagnosis not present

## 2016-05-11 DIAGNOSIS — H33051 Total retinal detachment, right eye: Secondary | ICD-10-CM | POA: Diagnosis not present

## 2016-05-11 DIAGNOSIS — H539 Unspecified visual disturbance: Secondary | ICD-10-CM | POA: Diagnosis not present

## 2016-05-11 DIAGNOSIS — H5441 Blindness, right eye, normal vision left eye: Secondary | ICD-10-CM | POA: Diagnosis not present

## 2016-05-21 DIAGNOSIS — M810 Age-related osteoporosis without current pathological fracture: Secondary | ICD-10-CM | POA: Diagnosis not present

## 2016-05-27 DIAGNOSIS — S20212A Contusion of left front wall of thorax, initial encounter: Secondary | ICD-10-CM | POA: Diagnosis not present

## 2016-06-10 ENCOUNTER — Encounter: Payer: Self-pay | Admitting: Cardiovascular Disease

## 2016-06-10 DIAGNOSIS — M8000XA Age-related osteoporosis with current pathological fracture, unspecified site, initial encounter for fracture: Secondary | ICD-10-CM | POA: Diagnosis not present

## 2016-06-18 DIAGNOSIS — H35363 Drusen (degenerative) of macula, bilateral: Secondary | ICD-10-CM | POA: Diagnosis not present

## 2016-06-18 DIAGNOSIS — H33021 Retinal detachment with multiple breaks, right eye: Secondary | ICD-10-CM | POA: Diagnosis not present

## 2016-06-18 DIAGNOSIS — H4720 Unspecified optic atrophy: Secondary | ICD-10-CM | POA: Diagnosis not present

## 2016-06-18 DIAGNOSIS — H33059 Total retinal detachment, unspecified eye: Secondary | ICD-10-CM | POA: Diagnosis not present

## 2016-06-26 ENCOUNTER — Ambulatory Visit: Payer: Medicare Other | Admitting: Cardiovascular Disease

## 2016-07-29 ENCOUNTER — Ambulatory Visit (INDEPENDENT_AMBULATORY_CARE_PROVIDER_SITE_OTHER): Payer: Medicare Other | Admitting: Podiatry

## 2016-07-29 ENCOUNTER — Encounter: Payer: Self-pay | Admitting: Podiatry

## 2016-07-29 DIAGNOSIS — I2510 Atherosclerotic heart disease of native coronary artery without angina pectoris: Secondary | ICD-10-CM | POA: Diagnosis not present

## 2016-07-29 DIAGNOSIS — L600 Ingrowing nail: Secondary | ICD-10-CM

## 2016-07-29 NOTE — Patient Instructions (Signed)

## 2016-07-29 NOTE — Progress Notes (Signed)
Subjective:     Patient ID: SNEH LIGOCKI, male   DOB: [birth year], 82 y.o.   MRN: [id number]  HPI patient presents stating he has significant damage to the hallux nails of both feet and it makes it hard for him to wear shoe gear comfortably. States he's trim them cut on them and that nothing is working anymore to resolve the problem sense with daughter   Review of Systems  All other systems reviewed and are negative.      Objective:   Physical Exam  Constitutional: He is oriented to person, place, and time.  Cardiovascular: Intact distal pulses.   Musculoskeletal: Normal range of motion.  Neurological: He is oriented to person, place, and time.  Skin: Skin is warm.  Nursing note and vitals reviewed.  neurovascular status intact muscle strength was adequate range of motion within normal limits with patient found to have incurvated thick and painful hallux nails bilateral that are loose and show signs that he's excessively traumatized them with attempting to cut. Found have good digital perfusion and is well oriented 3     Assessment:     Damaged hallux nails bilateral dystrophic changes on the dorsal surface    Plan:     H&P conditions reviewed and discussed options. Due to long-standing nature pain and inability to cut it's been recommended that they be removed and I did explain the risk of removal of nails and healing. Patient wants surgery and today I infiltrated each hallux 60 mg I can Marcaine mixture remove the hallux nails bilateral exposed matrix and applied phenol 3 applications 30 seconds followed by alcohol lavaged sterile dressing and instructed on soaks.

## 2016-07-29 NOTE — Progress Notes (Signed)
   Subjective:    Patient ID: [name], male    DOB: [birth year], 82 y.o.   MRN: [id number]  HPI Chief Complaint  Patient presents with  . Nail Problem    R and L great toe nails.Marland KitchenMarland KitchenPt states when he cuts the R it bleeds and the L "feels like it in ingrown."        Review of Systems  Genitourinary: Positive for difficulty urinating.  Musculoskeletal: Positive for gait problem.  Hematological: Bruises/bleeds easily.  All other systems reviewed and are negative.      Objective:   Physical Exam        Assessment & Plan:

## 2016-08-11 ENCOUNTER — Telehealth: Payer: Self-pay | Admitting: *Deleted

## 2016-08-11 NOTE — Telephone Encounter (Signed)
Called patient at [phone] (Home #) to check to see how they were doing from their ingrown toenail procedure that was performed on Wednesday, July 29, 2016. Pt stated, "Doing okay and not in any pain".

## 2016-08-27 DIAGNOSIS — Z23 Encounter for immunization: Secondary | ICD-10-CM | POA: Diagnosis not present

## 2016-10-04 NOTE — Progress Notes (Signed)
Patient ID: [name], male   DOB: 30-May-1929, 82 y.o.   MRN: [id number]   Waller is seen today for F/U CAD with CABG in 1999. He had a normal myovue in 2008. He denies SSCP or angina. He still lives alone in [place]. His daughter and son in law look in on him frequently and take good care of him. He still seems depressed since his wife died a few years ago. He decided not to get another dog since "lucky" died. He has had some wheezing and dyspnea. Last time I saw him with this complaint he had allergies and some bronchospastic disease. Inhalers helped and he needs to restart this. His primary Doctor Brigitte Pulse has done a CXR on him recently and it was "ok". He also gets some dyspnea on bending over which is funcitonal. His son in law [name] was with him today.   Echo 1/13 Ok with EF 55%  Impressions:  - Normal LV size with mild LV hypertrophy. There was also basal septal hypertrophy. EF [id number] (normal systolic function). Moderate left atrial enlargement. Normal RV size with mildly reduced RV systolic function. No significant valvular abnormalities.  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  Picked up a Statistician and concerns about falling and tripping with him  Seen by Posey Pronto in neurology and thought arthritic spine disease and neuropathy contributing not parkinson's   Not taking asa. Thinks it makes his stools dark and constipates him Previous history of bowel Surgery for diverticulitis   ROS: Denies fever, malais, weight loss, blurry vision, decreased visual acuity, cough, sputum, SOB, hemoptysis, pleuritic pain, palpitaitons, heartburn, abdominal pain, melena, lower extremity edema, claudication, or rash.  All other systems reviewed and negative  General: Affect  appropriate Healthy:  appears stated age 82: normal Neck supple with no adenopathy JVP normal no bruits no thyromegaly Lungs clear with no wheezing and good diaphragmatic motion Heart:  S1/S2 no murmur, no rub, gallop or click PMI normal Abdomen: benighn, BS positve, no tenderness, no AAA no bruit.  No HSM or HJR Distal pulses intact with no bruits No edema Neuro non-focal Skin warm and dry No muscular weakness   Current Outpatient Prescriptions  Medication Sig Dispense Refill  . albuterol (PROVENTIL HFA;VENTOLIN HFA) 108 (90 Base) MCG/ACT inhaler Inhale 1-2 puffs into the lungs every 6 (six) hours as needed for wheezing or shortness of breath.    Marland Kitchen atorvastatin (LIPITOR) 20 MG tablet Take 20 mg by mouth daily.     . B Complex-C (B-COMPLEX WITH VITAMIN C) tablet Take 1 tablet by mouth daily.    . cholecalciferol (VITAMIN D) 1000 UNITS tablet Take 1,000 Units by mouth daily.     . Esomeprazole Magnesium (NEXIUM PO) Take 1 tablet by mouth daily.    Marland Kitchen omega-3 acid ethyl esters (LOVAZA) 1 G capsule Take 1 g by mouth daily.    . ramipril (ALTACE) 5 MG capsule Take 5 mg by mouth daily.    . vitamin C (ASCORBIC ACID) 500 MG tablet Take 500 mg by mouth daily.     . clopidogrel (PLAVIX) 75 MG tablet Take 1 tablet (75 mg total) by mouth daily. 90 tablet 3   No current facility-administered medications for this visit.  Allergies  Sulfonamide derivatives  Electrocardiogram:  09/05/14  SR rate 78 LAD LVH ? Old anterior MI  05/22/15  SR PAC LAD normal ST segments rate 72 10/07/16  SR rate 67 PVC ICRBBB LVH   Assessment and Plan CAD:/CABG 1999   Stable with no angina and good activity level.  Will call in generic plavix for him If he thinks stool is dark instructed him to call his GI doctor Collene Mares and get guaic cards and f/u to  Schedule colonoscopy   Asthma:  PRN inhaler no active wheezing   HTN  Well controlled.  Continue current medications and low sodium Dash type diet.    Chol   Cholesterol is at goal.  Continue current dose of statin and diet Rx.  No myalgias or side effects.  F/U  LFT's in 6 months. Labs with primary  Neuro:   PT/ gait training f/u Posey Pronto   GI:  F/u Dr Collene Mares    F/U with me in 6 months

## 2016-10-07 ENCOUNTER — Ambulatory Visit (INDEPENDENT_AMBULATORY_CARE_PROVIDER_SITE_OTHER): Payer: Medicare Other | Admitting: Cardiovascular Disease

## 2016-10-07 ENCOUNTER — Encounter: Payer: Self-pay | Admitting: Cardiovascular Disease

## 2016-10-07 VITALS — BP 110/60 | HR 68 | Ht 66.0 in | Wt 188.1 lb

## 2016-10-07 DIAGNOSIS — I2510 Atherosclerotic heart disease of native coronary artery without angina pectoris: Secondary | ICD-10-CM

## 2016-10-07 DIAGNOSIS — I2583 Coronary atherosclerosis due to lipid rich plaque: Secondary | ICD-10-CM

## 2016-10-07 MED ORDER — CLOPIDOGREL BISULFATE 75 MG PO TABS: 75.0000 mg | ORAL_TABLET | Freq: Every day | ORAL | 3 refills | Status: DC

## 2016-10-07 NOTE — Patient Instructions (Addendum)
Medication Instructions:  Your physician has recommended you make the following change in your medication:  1-STOP Aspirin 2-START Plavix 75 mg by mouth daily  Labwork: NONE  Testing/Procedures: NONE  Follow-Up: Your physician wants you to follow-up in: 6 months with Dr. Johnsie Cancel. You will receive a reminder letter in the mail two months in advance. If you don't receive a letter, please call our office to schedule the follow-up appointment.   If you need a refill on your cardiac medications before your next appointment, please call your pharmacy.

## 2016-10-15 DIAGNOSIS — K219 Gastro-esophageal reflux disease without esophagitis: Secondary | ICD-10-CM | POA: Diagnosis not present

## 2016-10-15 DIAGNOSIS — K5901 Slow transit constipation: Secondary | ICD-10-CM | POA: Diagnosis not present

## 2016-10-15 DIAGNOSIS — Z1211 Encounter for screening for malignant neoplasm of colon: Secondary | ICD-10-CM | POA: Diagnosis not present

## 2016-10-15 DIAGNOSIS — K439 Ventral hernia without obstruction or gangrene: Secondary | ICD-10-CM | POA: Diagnosis not present

## 2016-12-14 DIAGNOSIS — M8000XA Age-related osteoporosis with current pathological fracture, unspecified site, initial encounter for fracture: Secondary | ICD-10-CM | POA: Diagnosis not present

## 2017-03-12 ENCOUNTER — Ambulatory Visit: Payer: Medicare Other | Admitting: Cardiovascular Disease

## 2017-04-18 NOTE — Progress Notes (Signed)
Patient ID: [name], male   DOB: August 22, 1929, 83 y.o.   MRN: [id number]   [name] is seen today for F/U CAD with CABG in 1999. He had a normal myovue in 2008. He denies SSCP or angina. He still lives alone in [place]. His daughter and son in law look in on him frequently and take good care of him. He still seems depressed since his wife died a few years ago. He decided not to get another dog since "lucky" died. He has had some wheezing and dyspnea. Last time I saw him with this complaint he had allergies and some bronchospastic disease. Inhalers helped and he needs to restart this. His primary Doctor Brigitte Pulse has done a CXR on him recently and it was "ok". He also gets some dyspnea on bending over which is funcitonal. His son in law [name] was with him today.   Echo 1/13 Ok with EF 55%  Impressions:  - Normal LV size with mild LV hypertrophy. There was also basal septal hypertrophy. EF 81% (normal systolic function). Moderate left atrial enlargement. Normal RV size with mildly reduced RV systolic function. No significant valvular abnormalities.  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  Picked up a Statistician and concerns about falling and tripping with him  Seen by Posey Pronto in neurology and thought arthritic spine disease and neuropathy contributing not parkinson's   Not taking asa. Thinks it makes his stools dark and constipates him Previous history of bowel Surgery for diverticulitis   ROS: Denies fever, malais, weight loss, blurry vision, decreased visual acuity, cough, sputum, SOB, hemoptysis, pleuritic pain, palpitaitons, heartburn, abdominal pain, melena, lower extremity edema, claudication, or rash.  All other systems reviewed and negative  General: Affect  appropriate Healthy:  appears stated age 83: normal Neck supple with no adenopathy JVP normal no bruits no thyromegaly Lungs clear with no wheezing and good diaphragmatic motion Heart:  S1/S2 no murmur, no rub, gallop or click PMI normal Abdomen: benighn, BS positve, no tenderness, no AAA no bruit.  No HSM or HJR Distal pulses intact with no bruits No edema Neuro non-focal Skin warm and dry No muscular weakness    Current Outpatient Prescriptions  Medication Sig Dispense Refill  . albuterol (PROVENTIL HFA;VENTOLIN HFA) 108 (90 Base) MCG/ACT inhaler Inhale 1-2 puffs into the lungs every 6 (six) hours as needed for wheezing or shortness of breath. 1 Inhaler 3  . atorvastatin (LIPITOR) 20 MG tablet Take 20 mg by mouth daily.     . B Complex-C (B-COMPLEX WITH VITAMIN C) tablet Take 1 tablet by mouth daily.    . cholecalciferol (VITAMIN D) 1000 UNITS tablet Take 1,000 Units by mouth daily.     . clopidogrel (PLAVIX) 75 MG tablet Take 1 tablet (75 mg total) by mouth daily. 90 tablet 3  . denosumab (PROLIA) 60 MG/ML SOLN injection Inject 60 mg into the skin every 6 (six) months. Administer in upper arm, thigh, or abdomen    . Esomeprazole Magnesium (NEXIUM PO) Take 1 tablet by mouth every other day.     . omega-3 acid ethyl esters (LOVAZA) 1 G capsule Take 1 g by mouth daily.    . ramipril (ALTACE) 5 MG capsule Take 5 mg by mouth daily.    Marland Kitchen  vitamin C (ASCORBIC ACID) 500 MG tablet Take 500 mg by mouth daily.      No current facility-administered medications for this visit.     Allergies  Sulfonamide derivatives  Electrocardiogram:  09/05/14  SR rate 78 LAD LVH ? Old anterior MI  05/22/15  SR PAC LAD normal ST segments rate 72 10/07/16  SR rate 67 PVC ICRBBB LVH   Assessment and Plan CAD:/CABG 1999   Normal myovue 2008 Has not had stress test in 10 years will order Lexiscan myovue l.  On generic plavix  Asthma:  PRN inhaler no active wheezing   HTN  Well controlled.  Continue  current medications and low sodium Dash type diet.    Chol  Cholesterol is at goal.  Continue current dose of statin and diet Rx.  No myalgias or side effects.  F/U  LFT's in 6 months. Labs with primary  Neuro:   PT/ gait training f/u Posey Pronto   GI:  F/u Dr Collene Mares    F/U with me in 6 months

## 2017-04-21 ENCOUNTER — Encounter: Payer: Self-pay | Admitting: Cardiovascular Disease

## 2017-04-21 ENCOUNTER — Ambulatory Visit (INDEPENDENT_AMBULATORY_CARE_PROVIDER_SITE_OTHER): Payer: Medicare Other | Admitting: Cardiovascular Disease

## 2017-04-21 VITALS — BP 106/50 | HR 70 | Ht 66.0 in | Wt 189.2 lb

## 2017-04-21 DIAGNOSIS — R079 Chest pain, unspecified: Secondary | ICD-10-CM | POA: Diagnosis not present

## 2017-04-21 DIAGNOSIS — I2583 Coronary atherosclerosis due to lipid rich plaque: Secondary | ICD-10-CM

## 2017-04-21 DIAGNOSIS — I2510 Atherosclerotic heart disease of native coronary artery without angina pectoris: Secondary | ICD-10-CM | POA: Diagnosis not present

## 2017-04-21 MED ORDER — ALBUTEROL SULFATE HFA 108 (90 BASE) MCG/ACT IN AERS: 1.0000 | INHALATION_SPRAY | Freq: Four times a day (QID) | RESPIRATORY_TRACT | 3 refills | Status: DC | PRN

## 2017-04-21 NOTE — Patient Instructions (Addendum)
Medication Instructions:  Your physician recommends that you continue on your current medications as directed. Please refer to the Current Medication list given to you today.  Labwork: NONE  Testing/Procedures Your physician has requested that you have a lexiscan myoview. For further information please visit [email or web address]. Please follow instruction sheet, as given.  Follow-Up: Your physician wants you to follow-up in: 6 months with Dr. Nishan. You will receive a reminder letter in the mail two months in advance. If you don't receive a letter, please call our office to schedule the follow-up appointment.   If you need a refill on your cardiac medications before your next appointment, please call your pharmacy.    

## 2017-04-28 ENCOUNTER — Telehealth (HOSPITAL_COMMUNITY): Payer: Self-pay | Admitting: *Deleted

## 2017-04-28 NOTE — Telephone Encounter (Signed)
Patient given detailed instructions per Myocardial Perfusion Study Information Sheet for the test on 05/04/17. Patient notified to arrive 15 minutes early and that it is imperative to arrive on time for appointment to keep from having the test rescheduled.  If you need to cancel or reschedule your appointment, please call the office within 24 hours of your appointment. . Patient verbalized understanding. Kirstie Peri

## 2017-05-04 ENCOUNTER — Ambulatory Visit (HOSPITAL_COMMUNITY): Payer: Medicare Other | Attending: Cardiology

## 2017-05-04 DIAGNOSIS — I10 Essential (primary) hypertension: Secondary | ICD-10-CM | POA: Diagnosis not present

## 2017-05-04 DIAGNOSIS — I25119 Atherosclerotic heart disease of native coronary artery with unspecified angina pectoris: Secondary | ICD-10-CM | POA: Insufficient documentation

## 2017-05-04 DIAGNOSIS — R079 Chest pain, unspecified: Secondary | ICD-10-CM | POA: Diagnosis not present

## 2017-05-04 DIAGNOSIS — I2583 Coronary atherosclerosis due to lipid rich plaque: Secondary | ICD-10-CM | POA: Insufficient documentation

## 2017-05-04 DIAGNOSIS — I2510 Atherosclerotic heart disease of native coronary artery without angina pectoris: Secondary | ICD-10-CM | POA: Diagnosis not present

## 2017-05-04 LAB — MYOCARDIAL PERFUSION IMAGING
CHL CUP RESTING HR STRESS: 60 {beats}/min
CSEPPHR: 90 {beats}/min
LV sys vol: 56 mL
LVDIAVOL: 108 mL (ref 62–150)
RATE: 0.31
SDS: 3
SRS: 3
SSS: 6
TID: 1.01

## 2017-05-04 MED ORDER — TECHNETIUM TC 99M TETROFOSMIN IV KIT
32.8000 | PACK | Freq: Once | INTRAVENOUS | Status: AC | PRN
  Administered 2017-05-04: 32.8 via INTRAVENOUS
  Filled 2017-05-04: qty 33

## 2017-05-04 MED ORDER — TECHNETIUM TC 99M TETROFOSMIN IV KIT
10.3000 | PACK | Freq: Once | INTRAVENOUS | Status: AC | PRN
  Administered 2017-05-04: 10.3 via INTRAVENOUS
  Filled 2017-05-04: qty 11

## 2017-05-04 MED ORDER — REGADENOSON 0.4 MG/5ML IV SOLN
0.4000 mg | Freq: Once | INTRAVENOUS | Status: AC
  Administered 2017-05-04: 0.4 mg via INTRAVENOUS

## 2017-05-05 ENCOUNTER — Telehealth: Payer: Self-pay | Admitting: Cardiovascular Disease

## 2017-05-05 NOTE — Telephone Encounter (Signed)
Left message for patient to call back  

## 2017-05-05 NOTE — Telephone Encounter (Signed)
°  New Prob   Daughter calling to follow up on pts most recent nuclear stress test results as pt is hard of hearing. Please call.

## 2017-05-07 NOTE — Telephone Encounter (Signed)
Patient aware of stress test results.  

## 2017-05-17 ENCOUNTER — Other Ambulatory Visit: Payer: Self-pay | Admitting: Dermatology

## 2017-05-17 DIAGNOSIS — D229 Melanocytic nevi, unspecified: Secondary | ICD-10-CM | POA: Diagnosis not present

## 2017-05-17 DIAGNOSIS — D492 Neoplasm of unspecified behavior of bone, soft tissue, and skin: Secondary | ICD-10-CM | POA: Diagnosis not present

## 2017-05-17 DIAGNOSIS — L409 Psoriasis, unspecified: Secondary | ICD-10-CM | POA: Diagnosis not present

## 2017-05-17 DIAGNOSIS — L820 Inflamed seborrheic keratosis: Secondary | ICD-10-CM | POA: Diagnosis not present

## 2017-06-22 DIAGNOSIS — H4720 Unspecified optic atrophy: Secondary | ICD-10-CM | POA: Diagnosis not present

## 2017-06-22 DIAGNOSIS — H35363 Drusen (degenerative) of macula, bilateral: Secondary | ICD-10-CM | POA: Diagnosis not present

## 2017-06-22 DIAGNOSIS — H33021 Retinal detachment with multiple breaks, right eye: Secondary | ICD-10-CM | POA: Diagnosis not present

## 2017-06-22 DIAGNOSIS — H35351 Cystoid macular degeneration, right eye: Secondary | ICD-10-CM | POA: Diagnosis not present

## 2017-06-22 DIAGNOSIS — H26492 Other secondary cataract, left eye: Secondary | ICD-10-CM | POA: Diagnosis not present

## 2017-06-28 DIAGNOSIS — I499 Cardiac arrhythmia, unspecified: Secondary | ICD-10-CM | POA: Diagnosis not present

## 2017-06-28 DIAGNOSIS — Z0000 Encounter for general adult medical examination without abnormal findings: Secondary | ICD-10-CM | POA: Diagnosis not present

## 2017-06-28 DIAGNOSIS — H9193 Unspecified hearing loss, bilateral: Secondary | ICD-10-CM | POA: Diagnosis not present

## 2017-06-28 DIAGNOSIS — E782 Mixed hyperlipidemia: Secondary | ICD-10-CM | POA: Diagnosis not present

## 2017-06-28 DIAGNOSIS — I2510 Atherosclerotic heart disease of native coronary artery without angina pectoris: Secondary | ICD-10-CM | POA: Diagnosis not present

## 2017-06-28 DIAGNOSIS — I119 Hypertensive heart disease without heart failure: Secondary | ICD-10-CM | POA: Diagnosis not present

## 2017-06-28 DIAGNOSIS — M8000XA Age-related osteoporosis with current pathological fracture, unspecified site, initial encounter for fracture: Secondary | ICD-10-CM | POA: Diagnosis not present

## 2017-08-19 DIAGNOSIS — Z23 Encounter for immunization: Secondary | ICD-10-CM | POA: Diagnosis not present

## 2017-09-21 ENCOUNTER — Other Ambulatory Visit: Payer: Self-pay | Admitting: Cardiovascular Disease

## 2017-09-21 DIAGNOSIS — I2583 Coronary atherosclerosis due to lipid rich plaque: Principal | ICD-10-CM

## 2017-09-21 DIAGNOSIS — I2510 Atherosclerotic heart disease of native coronary artery without angina pectoris: Secondary | ICD-10-CM

## 2017-11-30 NOTE — Progress Notes (Signed)
Patient ID: [name], male   DOB: Dec 29, 1928, 84 y.o.   MRN: [id number]   [name] is seen today for F/U CAD with CABG in 1999. He had a normal myovue in 2008. He denies SSCP or angina. He still lives alone in [place]. His daughter and son in law look in on him frequently and take good care of him. Widowed for a few years and depressed    Echo 1/13 Ok with EF 55%  Impressions:  - Normal LV size with mild LV hypertrophy. There was also basal septal hypertrophy. EF 10% (normal systolic function). Moderate left atrial enlargement. Normal RV size with mildly reduced RV systolic function. No significant valvular abnormalities.  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  Picked up a Statistician and concerns about falling and tripping with him  Seen by Posey Pronto in neurology and thought arthritic spine disease and neuropathy contributing not parkinson's   Not taking asa. Thinks it makes his stools dark and constipates him Previous history of bowel Surgery for diverticulitis   Myovue reviewed from 05/04/17 EF 48% no ischemia or infarction Rx medically   ROS: Denies fever, malais, weight loss, blurry vision, decreased visual acuity, cough, sputum, SOB, hemoptysis, pleuritic pain, palpitaitons, heartburn, abdominal pain, melena, lower extremity edema, claudication, or rash.  All other systems reviewed and negative  General: Affect appropriate Healthy:  appears stated age 84: normal Neck supple with no adenopathy JVP normal no bruits no thyromegaly Lungs clear with no wheezing and good diaphragmatic motion Heart:  S1/S2 no murmur, no rub, gallop or click PMI normal Abdomen: benighn, BS positve, no tenderness, no AAA no bruit.  No HSM or HJR Distal pulses intact with no bruits No edema Neuro non-focal Skin warm and dry No muscular weakness Broad based gait     Current  Outpatient Medications  Medication Sig Dispense Refill  . albuterol (PROVENTIL HFA;VENTOLIN HFA) 108 (90 Base) MCG/ACT inhaler Inhale 1-2 puffs into the lungs every 6 (six) hours as needed for wheezing or shortness of breath. 1 Inhaler 3  . atorvastatin (LIPITOR) 20 MG tablet Take 20 mg by mouth daily.     . B Complex-C (B-COMPLEX WITH VITAMIN C) tablet Take 1 tablet by mouth daily.    . cholecalciferol (VITAMIN D) 1000 UNITS tablet Take 1,000 Units by mouth daily.     . clopidogrel (PLAVIX) 75 MG tablet TAKE 1 TABLET (75 MG TOTAL) BY MOUTH DAILY. 90 tablet 1  . denosumab (PROLIA) 60 MG/ML SOLN injection Inject 60 mg into the skin every 6 (six) months. Administer in upper arm, thigh, or abdomen    . Esomeprazole Magnesium (NEXIUM PO) Take 1 tablet by mouth every other day.     . omega-3 acid ethyl esters (LOVAZA) 1 G capsule Take 1 g by mouth daily.    . ramipril (ALTACE) 5 MG capsule Take 5 mg by mouth daily.    . vitamin C (ASCORBIC ACID) 500 MG tablet Take 500 mg by mouth daily.      No current facility-administered medications for this visit.     Allergies  Sulfonamide derivatives  Electrocardiogram:  09/05/14  SR rate 78 LAD LVH ? Old anterior MI  05/22/15  SR PAC LAD normal ST segments rate 72 10/07/16  SR rate 67 PVC ICRBBB LVH   Assessment and Plan CAD:/CABG 1999   Myovue June 2018  no ischemia no chest pain continue medical Rx   Asthma:  PRN inhaler no active wheezing   HTN  Well controlled.  Continue current medications and low sodium Dash type diet.    Chol  Cholesterol is at goal.  Continue current dose of statin and diet Rx.  No myalgias or side effects.  F/U  LFT's in 6 months. Labs with primary  Neuro:   PT/ gait training f/u Posey Pronto   GI:  F/u Dr Collene Mares    F/U with me in 6 months

## 2017-12-02 ENCOUNTER — Ambulatory Visit (INDEPENDENT_AMBULATORY_CARE_PROVIDER_SITE_OTHER): Payer: Medicare Other | Admitting: Cardiovascular Disease

## 2017-12-02 ENCOUNTER — Encounter: Payer: Self-pay | Admitting: Cardiovascular Disease

## 2017-12-02 VITALS — BP 138/78 | HR 68 | Ht 66.0 in | Wt 189.0 lb

## 2017-12-02 DIAGNOSIS — I2583 Coronary atherosclerosis due to lipid rich plaque: Secondary | ICD-10-CM

## 2017-12-02 DIAGNOSIS — E785 Hyperlipidemia, unspecified: Secondary | ICD-10-CM

## 2017-12-02 DIAGNOSIS — I2510 Atherosclerotic heart disease of native coronary artery without angina pectoris: Secondary | ICD-10-CM

## 2017-12-02 DIAGNOSIS — H61009 Unspecified perichondritis of external ear, unspecified ear: Secondary | ICD-10-CM | POA: Diagnosis not present

## 2017-12-02 DIAGNOSIS — D229 Melanocytic nevi, unspecified: Secondary | ICD-10-CM | POA: Diagnosis not present

## 2017-12-02 DIAGNOSIS — I10 Essential (primary) hypertension: Secondary | ICD-10-CM | POA: Diagnosis not present

## 2017-12-02 DIAGNOSIS — L570 Actinic keratosis: Secondary | ICD-10-CM | POA: Diagnosis not present

## 2017-12-02 NOTE — Patient Instructions (Signed)

## 2018-01-12 ENCOUNTER — Other Ambulatory Visit: Payer: Self-pay | Admitting: Family Medicine

## 2018-01-12 ENCOUNTER — Ambulatory Visit
Admission: RE | Admit: 2018-01-12 | Discharge: 2018-01-12 | Disposition: A | Discharge status: Home / Self Care | Payer: Medicare Other | Source: Ambulatory Visit | Attending: Family Medicine | Admitting: Family Medicine

## 2018-01-12 DIAGNOSIS — E782 Mixed hyperlipidemia: Secondary | ICD-10-CM | POA: Diagnosis not present

## 2018-01-12 DIAGNOSIS — M545 Low back pain: Secondary | ICD-10-CM | POA: Diagnosis not present

## 2018-01-12 DIAGNOSIS — M8000XA Age-related osteoporosis with current pathological fracture, unspecified site, initial encounter for fracture: Secondary | ICD-10-CM | POA: Diagnosis not present

## 2018-01-12 DIAGNOSIS — I119 Hypertensive heart disease without heart failure: Secondary | ICD-10-CM | POA: Diagnosis not present

## 2018-01-12 DIAGNOSIS — I2510 Atherosclerotic heart disease of native coronary artery without angina pectoris: Secondary | ICD-10-CM | POA: Diagnosis not present

## 2018-01-12 DIAGNOSIS — M549 Dorsalgia, unspecified: Secondary | ICD-10-CM | POA: Diagnosis not present

## 2018-03-25 ENCOUNTER — Other Ambulatory Visit: Payer: Self-pay | Admitting: Cardiovascular Disease

## 2018-03-25 DIAGNOSIS — I2583 Coronary atherosclerosis due to lipid rich plaque: Principal | ICD-10-CM

## 2018-03-25 DIAGNOSIS — I2510 Atherosclerotic heart disease of native coronary artery without angina pectoris: Secondary | ICD-10-CM

## 2018-05-17 ENCOUNTER — Other Ambulatory Visit: Payer: Self-pay

## 2018-05-17 DIAGNOSIS — I2583 Coronary atherosclerosis due to lipid rich plaque: Principal | ICD-10-CM

## 2018-05-17 DIAGNOSIS — I2510 Atherosclerotic heart disease of native coronary artery without angina pectoris: Secondary | ICD-10-CM

## 2018-05-17 MED ORDER — CLOPIDOGREL BISULFATE 75 MG PO TABS: 75.0000 mg | ORAL_TABLET | Freq: Every day | ORAL | 0 refills | Status: DC

## 2018-05-23 ENCOUNTER — Telehealth: Payer: Self-pay | Admitting: Cardiovascular Disease

## 2018-05-23 NOTE — Telephone Encounter (Signed)
Family is very concerned about patient. Patient is suppose to go out of town this weekend to [place]. Patient's son-in-law, Ronalee Belts stated that  yesterday evening during dinner patient fainted while eating. At the time patient had jaw pain and voided on himself. Ronalee Belts talked to patient early this morning patient stated he was fine. Ronalee Belts is worried about patient going out of town and wants patient checked out before they leave. Per Dr. Johnsie Cancel, patient needs to be seen today or tomorrow with PA. Made earliest appointment with Ellen Henri PA tomorrow. Ronalee Belts stated patient will be able to make the appointment and appreciated the call back.

## 2018-05-23 NOTE — Telephone Encounter (Signed)
Seeing PA tomorow

## 2018-05-23 NOTE — Telephone Encounter (Signed)
New Message    Pt c/o Syncope: STAT if syncope occurred within 30 minutes and pt complains of lightheadedness High Priority if episode of passing out, completely, today or in last 24 hours   1. Did you pass out today? No   When is the last time you passed out? 05/22/2018 2. Has this occurred multiple times? No   3. Did you have any symptoms prior to passing out? Light headache, some jaw pain. He has been feeling nausea.

## 2018-05-24 ENCOUNTER — Ambulatory Visit (INDEPENDENT_AMBULATORY_CARE_PROVIDER_SITE_OTHER): Payer: Medicare Other

## 2018-05-24 ENCOUNTER — Ambulatory Visit (INDEPENDENT_AMBULATORY_CARE_PROVIDER_SITE_OTHER): Payer: Medicare Other | Admitting: Cardiology

## 2018-05-24 ENCOUNTER — Encounter: Payer: Self-pay | Admitting: Cardiology

## 2018-05-24 ENCOUNTER — Encounter (INDEPENDENT_AMBULATORY_CARE_PROVIDER_SITE_OTHER): Payer: Self-pay

## 2018-05-24 VITALS — BP 120/80 | HR 67 | Ht 66.0 in | Wt 180.0 lb

## 2018-05-24 DIAGNOSIS — Z79899 Other long term (current) drug therapy: Secondary | ICD-10-CM

## 2018-05-24 DIAGNOSIS — R55 Syncope and collapse: Secondary | ICD-10-CM

## 2018-05-24 DIAGNOSIS — I493 Ventricular premature depolarization: Secondary | ICD-10-CM

## 2018-05-24 DIAGNOSIS — I2583 Coronary atherosclerosis due to lipid rich plaque: Secondary | ICD-10-CM

## 2018-05-24 DIAGNOSIS — I2510 Atherosclerotic heart disease of native coronary artery without angina pectoris: Secondary | ICD-10-CM

## 2018-05-24 LAB — CBC WITH DIFFERENTIAL/PLATELET
Basophils Absolute: 0 10*3/uL (ref 0.0–0.2)
Basos: 1 %
EOS (ABSOLUTE): 0.1 10*3/uL (ref 0.0–0.4)
Eos: 2 %
HEMOGLOBIN: 13.4 g/dL (ref 13.0–17.7)
Hematocrit: 39.6 % (ref 37.5–51.0)
IMMATURE GRANULOCYTES: 0 %
Immature Grans (Abs): 0 10*3/uL (ref 0.0–0.1)
LYMPHS: 35 %
Lymphocytes Absolute: 2.3 10*3/uL (ref 0.7–3.1)
MCH: 31.7 pg (ref 26.6–33.0)
MCHC: 33.8 g/dL (ref 31.5–35.7)
MCV: 94 fL (ref 79–97)
MONOCYTES: 12 %
Monocytes Absolute: 0.8 10*3/uL (ref 0.1–0.9)
NEUTROS PCT: 50 %
Neutrophils Absolute: 3.3 10*3/uL (ref 1.4–7.0)
Platelets: 179 10*3/uL (ref 150–450)
RBC: 4.23 x10E6/uL (ref 4.14–5.80)
RDW: 13.4 % (ref 12.3–15.4)
WBC: 6.4 10*3/uL (ref 3.4–10.8)

## 2018-05-24 LAB — BASIC METABOLIC PANEL
BUN/Creatinine Ratio: 24 (ref 10–24)
BUN: 25 mg/dL (ref 8–27)
CALCIUM: 9.1 mg/dL (ref 8.6–10.2)
CO2: 22 mmol/L (ref 20–29)
CREATININE: 1.06 mg/dL (ref 0.76–1.27)
Chloride: 102 mmol/L (ref 96–106)
GFR calc Af Amer: 73 mL/min/{1.73_m2} (ref >59)
GFR, EST NON AFRICAN AMERICAN: 63 mL/min/{1.73_m2} (ref >59)
Glucose: 100 mg/dL — ABNORMAL HIGH (ref 65–99)
POTASSIUM: 4.1 mmol/L (ref 3.5–5.2)
Sodium: 139 mmol/L (ref 134–144)

## 2018-05-24 NOTE — Patient Instructions (Signed)
Your physician recommends that you continue on your current medications as directed. Please refer to the Current Medication list given to you today.  Your physician recommends that you return for lab work in:  [place] has recommended that you wear a holter monitor. Holter monitors are medical devices that record the heart's electrical activity. Doctors most often use these monitors to diagnose arrhythmias. Arrhythmias are problems with the speed or rhythm of the heartbeat. The monitor is a small, portable device. You can wear one while you do your normal daily activities. This is usually used to diagnose what is causing palpitations/syncope (passing out).  [place]  Your physician recommends that you schedule a follow-up appointment in:  AS PLANNED

## 2018-05-24 NOTE — Progress Notes (Signed)
05/24/2018 [name]   11/19/1928  [id number]  Primary Physician [name], MD Primary Cardiologist: Dr. Johnsie Cancel   Reason for Visit/CC: Syncope and Jaw pain   HPI:  [name] is a 84 y.o. male who is being seen today for the evaluation of syncope and jaw pain. He is followed by Dr. Johnsie Cancel and has known CAD with CABG in 1999. He had a normal myovue in 2008. Repeat Myovue 05/04/17 showed normal EF at 48% and no ischemia or infarction Rx medically. Last echo 11/2011 showed normal LVEF and G1DD. He lives alone, but his family lives close by and checks on him frequently.   Pt notes that he recently passed out while eating at a restaurant. He notes experiencing left sided jaw pain right at the TMJ junction, while chewing (poping sensation), then suddenly felt dizzy, nauseated and diaphoretic and had brief LOC for several seconds. He denies any associated CP, palpations and dyspnea.  He felt ok after episode. No recurrent symptoms. He notes he has a similar episode > 10 years ago that also occurred while eating. He has had no recurrent syncope since episode occurred 2 days ago. No exertional symptoms. He reports full med compliance. BP is well controlled. EKG shows SR 62 bpm with PVCs. His family admits that he has not been eating regularly. They noted decreased PO intake. Has also been working on his car in the hot heat. They are concerned about possible dehydration.   Cardiac Studies   NST 04/2017 Study Highlights     Nuclear stress EF: 48%.  No T wave inversion was noted during stress.  There was no ST segment deviation noted during stress.  This is an intermediate risk study.   No significant reversible ischemia. LVEF 48% with global hypokinesis. This is an intermediate risk study (due      2D Echo 11/2011 Study Conclusions  - Left ventricle: The cavity size was normal. Wall thickness was increased in a pattern of mild LVH. There was mild focal basal hypertrophy  of the septum. Systolic function was normal. The estimated ejection fraction was 55%. Wall motion was normal; there were no regional wall motion abnormalities. Doppler parameters are consistent with abnormal left ventricular relaxation (grade 1 diastolic dysfunction). - Aortic valve: There was no stenosis. - Mitral valve: Mildly calcified annulus. Trivial regurgitation. - Left atrium: The atrium was moderately dilated. - Right ventricle: The cavity size was normal. Systolic function was mildly reduced. - Pulmonary arteries: PA peak pressure: 73mm Hg (S). - Inferior vena cava: The vessel was normal in size; the respirophasic diameter changes were in the normal range (= 50%); findings are consistent with normal central venous pressure. Impressions:  - Normal LV size with mild LV hypertrophy. There was also basal septal hypertrophy. EF 74% (normal systolic function). Moderate left atrial enlargement. Normal RV size with mildly reduced RV systolic function. No significant valvular abnormalities.  Current Meds  Medication Sig  . albuterol (PROVENTIL HFA;VENTOLIN HFA) 108 (90 Base) MCG/ACT inhaler Inhale 1-2 puffs into the lungs every 6 (six) hours as needed for wheezing or shortness of breath.  Marland Kitchen atorvastatin (LIPITOR) 20 MG tablet Take 20 mg by mouth daily.   . B Complex-C (B-COMPLEX WITH VITAMIN C) tablet Take 1 tablet by mouth daily.  . cholecalciferol (VITAMIN D) 1000 UNITS tablet Take 1,000 Units by mouth daily.   . clopidogrel (PLAVIX) 75 MG tablet Take 1 tablet (75 mg total) by mouth daily.  Marland Kitchen denosumab (PROLIA) 60 MG/ML  SOLN injection Inject 60 mg into the skin every 6 (six) months. Administer in upper arm, thigh, or abdomen  . Esomeprazole Magnesium (NEXIUM PO) Take 1 tablet by mouth every other day.   . omega-3 acid ethyl esters (LOVAZA) 1 G capsule Take 1 g by mouth daily.  . ramipril (ALTACE) 5 MG capsule Take 5 mg by mouth daily.  . vitamin C  (ASCORBIC ACID) 500 MG tablet Take 500 mg by mouth daily.    Allergies  Allergen Reactions  . Sulfonamide Derivatives Itching and Rash   Past Medical History:  Diagnosis Date  . CAD   . HYPERLIPIDEMIA   . HYPERTENSION   . Shortness of breath    Family History  Problem Relation Age of Onset  . Coronary artery disease Father   . Anuerysm Father   . Heart attack Brother   . Healthy Daughter    Past Surgical History:  Procedure Laterality Date  . APPENDECTOMY    . CORONARY ARTERY BYPASS GRAFT    . KIDNEY STONE SURGERY    . REFRACTIVE SURGERY     Social History   Socioeconomic History  . Marital status: Widowed    Spouse name: Not on file  . Number of children: Not on file  . Years of education: Not on file  . Highest education level: Not on file  Occupational History  . Not on file  Social Needs  . Financial resource strain: Not on file  . Food insecurity:    Worry: Not on file    Inability: Not on file  . Transportation needs:    Medical: Not on file    Non-medical: Not on file  Tobacco Use  . Smoking status: Former Smoker    Packs/day: 2.00    Years: 40.00    Pack years: 80.00    Types: Cigarettes  . Smokeless tobacco: Never Used  Substance and Sexual Activity  . Alcohol use: No    Alcohol/week: 0.0 oz    Comment: 0.5 glass of wine nightly.  . Drug use: No  . Sexual activity: Not on file  Lifestyle  . Physical activity:    Days per week: Not on file    Minutes per session: Not on file  . Stress: Not on file  Relationships  . Social connections:    Talks on phone: Not on file    Gets together: Not on file    Attends religious service: Not on file    Active member of club or organization: Not on file    Attends meetings of clubs or organizations: Not on file    Relationship status: Not on file  . Intimate partner violence:    Fear of current or ex partner: Not on file    Emotionally abused: Not on file    Physically abused: Not on file    Forced  sexual activity: Not on file  Other Topics Concern  . Not on file  Social History Narrative   Lives alone in a one story home.  Has one daughter.     Retired Administrator.    Education: 9th grade     Review of Systems: General: negative for chills, fever, night sweats or weight changes.  Cardiovascular: negative for chest pain, dyspnea on exertion, edema, orthopnea, palpitations, paroxysmal nocturnal dyspnea or shortness of breath Dermatological: negative for rash Respiratory: negative for cough or wheezing Urologic: negative for hematuria Abdominal: negative for nausea, vomiting, diarrhea, bright red blood per rectum, melena, or hematemesis  Neurologic: negative for visual changes, syncope, or dizziness All other systems reviewed and are otherwise negative except as noted above.   Physical Exam:  Blood pressure 120/80, pulse 67, height 5\' 6"  (1.676 m), weight 180 lb (81.6 kg), SpO2 100 %.  General appearance: alert, cooperative and no distress Neck: no carotid bruit and no JVD Lungs: clear to auscultation bilaterally Heart: regular rate and rhythm, S1, S2 normal, no murmur, click, rub or gallop Extremities: extremities normal, atraumatic, no cyanosis or edema Pulses: 2+ and symmetric Skin: Skin color, texture, turgor normal. No rashes or lesions Neurologic: Grossly normal  EKG NSR 62 bpm w/ PVCs -- personally reviewed   ASSESSMENT AND PLAN:   1. Syncope: based on history surrounding event, suspect likely vasovagal syncope. However he is 89 with PVCs noted on EKG with resting HR of 62 and irregularity of rhythm noted on cardiac exam, thus I will arrange for a 48 hr holter monitor to assess PVC burden and monitor HR to r/o notable conduction disease/ bradycardia/ heart block. Will also check basic labs including BMP to r/o dehydration, check electrolytes as well as CBC to r/o anemia.   2. CAD: h/o CABG in 1999. NST 04/2017 showed normal EF and no ischemia. He denies any CP and no  dyspnea. Also no left arm pain, which was his anginal equivalent prior to CABG in 99. Continue medical therapy.     Daci Stubbe Ladoris Gene, MHS [facility] 05/24/2018 1:14 PM

## 2018-06-15 ENCOUNTER — Encounter: Payer: Self-pay | Admitting: Cardiovascular Disease

## 2018-06-15 ENCOUNTER — Ambulatory Visit (INDEPENDENT_AMBULATORY_CARE_PROVIDER_SITE_OTHER): Payer: Medicare Other | Admitting: Cardiovascular Disease

## 2018-06-15 VITALS — BP 122/68 | HR 71 | Ht 66.0 in | Wt 178.2 lb

## 2018-06-15 DIAGNOSIS — I493 Ventricular premature depolarization: Secondary | ICD-10-CM | POA: Diagnosis not present

## 2018-06-15 DIAGNOSIS — E785 Hyperlipidemia, unspecified: Secondary | ICD-10-CM | POA: Diagnosis not present

## 2018-06-15 DIAGNOSIS — I2583 Coronary atherosclerosis due to lipid rich plaque: Secondary | ICD-10-CM

## 2018-06-15 DIAGNOSIS — I10 Essential (primary) hypertension: Secondary | ICD-10-CM | POA: Diagnosis not present

## 2018-06-15 DIAGNOSIS — I2510 Atherosclerotic heart disease of native coronary artery without angina pectoris: Secondary | ICD-10-CM | POA: Diagnosis not present

## 2018-06-15 NOTE — Patient Instructions (Signed)

## 2018-06-15 NOTE — Progress Notes (Signed)
06/15/2018 [name]   [birth year]  [id number]  Primary Physician [name], MD Primary Cardiologist: Dr. Johnsie Cancel   Reason for Visit/CC: Syncope and Jaw pain   HPI: 84 y.o. CAD/ CABG in 1999 last myovue 2018 no ischemia. Seen by PA in July for "syncope" thought to be vasovagal Holter done 05/24/18 no significant arrhythmia EF 48% on myovue June 2018  His syncope episodes are precipitated by pain in his jaw/teeth when Eating Denies TMJ or chronic dental issues.   No chest pain , postural symptoms dyspnea or palpitations    Cardiac Studies   NST 04/2017 Study Highlights     Nuclear stress EF: 48%.  No T wave inversion was noted during stress.  There was no ST segment deviation noted during stress.  This is an intermediate risk study.   No significant reversible ischemia. LVEF 48% with global hypokinesis. This is an intermediate risk study (due      2D Echo 11/2011 Study Conclusions  - Left ventricle: The cavity size was normal. Wall thickness was increased in a pattern of mild LVH. There was mild focal basal hypertrophy of the septum. Systolic function was normal. The estimated ejection fraction was 55%. Wall motion was normal; there were no regional wall motion abnormalities. Doppler parameters are consistent with abnormal left ventricular relaxation (grade 1 diastolic dysfunction). - Aortic valve: There was no stenosis. - Mitral valve: Mildly calcified annulus. Trivial regurgitation. - Left atrium: The atrium was moderately dilated. - Right ventricle: The cavity size was normal. Systolic function was mildly reduced. - Pulmonary arteries: PA peak pressure: 94mm Hg (S). - Inferior vena cava: The vessel was normal in size; the respirophasic diameter changes were in the normal range (= 50%); findings are consistent with normal central venous pressure. Impressions:  - Normal LV size with mild LV hypertrophy. There was  also basal septal hypertrophy. EF 76% (normal systolic function). Moderate left atrial enlargement. Normal RV size with mildly reduced RV systolic function. No significant valvular abnormalities.  Current Meds  Medication Sig  . albuterol (PROVENTIL HFA;VENTOLIN HFA) 108 (90 Base) MCG/ACT inhaler Inhale 1-2 puffs into the lungs every 6 (six) hours as needed for wheezing or shortness of breath.  Marland Kitchen atorvastatin (LIPITOR) 20 MG tablet Take 20 mg by mouth daily.   . B Complex-C (B-COMPLEX WITH VITAMIN C) tablet Take 1 tablet by mouth daily.  . cholecalciferol (VITAMIN D) 1000 UNITS tablet Take 1,000 Units by mouth daily.   . clopidogrel (PLAVIX) 75 MG tablet Take 1 tablet (75 mg total) by mouth daily.  Marland Kitchen denosumab (PROLIA) 60 MG/ML SOLN injection Inject 60 mg into the skin every 6 (six) months. Administer in upper arm, thigh, or abdomen  . Esomeprazole Magnesium (NEXIUM PO) Take 1 tablet by mouth every other day.   . omega-3 acid ethyl esters (LOVAZA) 1 G capsule Take 1 g by mouth daily.  . ramipril (ALTACE) 5 MG capsule Take 5 mg by mouth daily.  . vitamin C (ASCORBIC ACID) 500 MG tablet Take 500 mg by mouth daily.    Allergies  Allergen Reactions  . Sulfonamide Derivatives Itching and Rash   Past Medical History:  Diagnosis Date  . CAD   . HYPERLIPIDEMIA   . HYPERTENSION   . Shortness of breath    Family History  Problem Relation Age of Onset  . Coronary artery disease Father   . Anuerysm Father   . Heart attack Brother   . Healthy Daughter    Past Surgical  History:  Procedure Laterality Date  . APPENDECTOMY    . CORONARY ARTERY BYPASS GRAFT    . KIDNEY STONE SURGERY    . REFRACTIVE SURGERY     Social History   Socioeconomic History  . Marital status: Widowed    Spouse name: Not on file  . Number of children: Not on file  . Years of education: Not on file  . Highest education level: Not on file  Occupational History  . Not on file  Social Needs  .  Financial resource strain: Not on file  . Food insecurity:    Worry: Not on file    Inability: Not on file  . Transportation needs:    Medical: Not on file    Non-medical: Not on file  Tobacco Use  . Smoking status: Former Smoker    Packs/day: 2.00    Years: 40.00    Pack years: 80.00    Types: Cigarettes  . Smokeless tobacco: Never Used  Substance and Sexual Activity  . Alcohol use: No    Alcohol/week: 0.0 oz    Comment: 0.5 glass of wine nightly.  . Drug use: No  . Sexual activity: Not on file  Lifestyle  . Physical activity:    Days per week: Not on file    Minutes per session: Not on file  . Stress: Not on file  Relationships  . Social connections:    Talks on phone: Not on file    Gets together: Not on file    Attends religious service: Not on file    Active member of club or organization: Not on file    Attends meetings of clubs or organizations: Not on file    Relationship status: Not on file  . Intimate partner violence:    Fear of current or ex partner: Not on file    Emotionally abused: Not on file    Physically abused: Not on file    Forced sexual activity: Not on file  Other Topics Concern  . Not on file  Social History Narrative   Lives alone in a one story home.  Has one daughter.     Retired Administrator.    Education: 9th grade     Review of Systems: General: negative for chills, fever, night sweats or weight changes.  Cardiovascular: negative for chest pain, dyspnea on exertion, edema, orthopnea, palpitations, paroxysmal nocturnal dyspnea or shortness of breath Dermatological: negative for rash Respiratory: negative for cough or wheezing Urologic: negative for hematuria Abdominal: negative for nausea, vomiting, diarrhea, bright red blood per rectum, melena, or hematemesis Neurologic: negative for visual changes, syncope, or dizziness All other systems reviewed and are otherwise negative except as noted above.   Physical Exam:  Blood pressure  122/68, pulse 71, height 5\' 6"  (1.676 m), weight 178 lb 4 oz (80.9 kg), SpO2 96 %.  General appearance: alert, cooperative and no distress Neck: no carotid bruit and no JVD Lungs: clear to auscultation bilaterally Heart: regular rate and rhythm, S1, S2 normal, no murmur, click, rub or gallop Extremities: extremities normal, atraumatic, no cyanosis or edema Pulses: 2+ and symmetric Skin: Skin color, texture, turgor normal. No rashes or lesions Neurologic: Grossly normal  EKG NSR 62 bpm w/ PVCs -- personally reviewed   ASSESSMENT AND PLAN:   1. Syncope: based on history surrounding event, suspect likely vasovagal syncope.  Holter 05/24/18 no significant arrhythmia  2. CAD: h/o CABG in 1999.  Myovue  04/2017 showed normal EF and no ischemia.  He denies any CP and no dyspnea. Continue medical therapy.    [name]

## 2018-06-23 DIAGNOSIS — H26492 Other secondary cataract, left eye: Secondary | ICD-10-CM | POA: Diagnosis not present

## 2018-06-23 DIAGNOSIS — H33059 Total retinal detachment, unspecified eye: Secondary | ICD-10-CM | POA: Diagnosis not present

## 2018-06-23 DIAGNOSIS — H35351 Cystoid macular degeneration, right eye: Secondary | ICD-10-CM | POA: Diagnosis not present

## 2018-06-23 DIAGNOSIS — H35363 Drusen (degenerative) of macula, bilateral: Secondary | ICD-10-CM | POA: Diagnosis not present

## 2018-06-23 DIAGNOSIS — H33021 Retinal detachment with multiple breaks, right eye: Secondary | ICD-10-CM | POA: Diagnosis not present

## 2018-06-23 DIAGNOSIS — H3341 Traction detachment of retina, right eye: Secondary | ICD-10-CM | POA: Diagnosis not present

## 2018-06-23 DIAGNOSIS — H4720 Unspecified optic atrophy: Secondary | ICD-10-CM | POA: Diagnosis not present

## 2018-07-05 DIAGNOSIS — M8000XA Age-related osteoporosis with current pathological fracture, unspecified site, initial encounter for fracture: Secondary | ICD-10-CM | POA: Diagnosis not present

## 2018-07-05 DIAGNOSIS — R296 Repeated falls: Secondary | ICD-10-CM | POA: Diagnosis not present

## 2018-07-05 DIAGNOSIS — H9193 Unspecified hearing loss, bilateral: Secondary | ICD-10-CM | POA: Diagnosis not present

## 2018-07-05 DIAGNOSIS — I119 Hypertensive heart disease without heart failure: Secondary | ICD-10-CM | POA: Diagnosis not present

## 2018-07-05 DIAGNOSIS — I2510 Atherosclerotic heart disease of native coronary artery without angina pectoris: Secondary | ICD-10-CM | POA: Diagnosis not present

## 2018-07-05 DIAGNOSIS — I499 Cardiac arrhythmia, unspecified: Secondary | ICD-10-CM | POA: Diagnosis not present

## 2018-07-05 DIAGNOSIS — E782 Mixed hyperlipidemia: Secondary | ICD-10-CM | POA: Diagnosis not present

## 2018-07-05 DIAGNOSIS — Z0000 Encounter for general adult medical examination without abnormal findings: Secondary | ICD-10-CM | POA: Diagnosis not present

## 2018-07-08 DIAGNOSIS — I10 Essential (primary) hypertension: Secondary | ICD-10-CM | POA: Diagnosis not present

## 2018-07-08 DIAGNOSIS — E782 Mixed hyperlipidemia: Secondary | ICD-10-CM | POA: Diagnosis not present

## 2018-07-08 DIAGNOSIS — Z7902 Long term (current) use of antithrombotics/antiplatelets: Secondary | ICD-10-CM | POA: Diagnosis not present

## 2018-07-08 DIAGNOSIS — K219 Gastro-esophageal reflux disease without esophagitis: Secondary | ICD-10-CM | POA: Diagnosis not present

## 2018-07-08 DIAGNOSIS — Z87891 Personal history of nicotine dependence: Secondary | ICD-10-CM | POA: Diagnosis not present

## 2018-07-08 DIAGNOSIS — M810 Age-related osteoporosis without current pathological fracture: Secondary | ICD-10-CM | POA: Diagnosis not present

## 2018-07-08 DIAGNOSIS — I2510 Atherosclerotic heart disease of native coronary artery without angina pectoris: Secondary | ICD-10-CM | POA: Diagnosis not present

## 2018-07-08 DIAGNOSIS — R296 Repeated falls: Secondary | ICD-10-CM | POA: Diagnosis not present

## 2018-07-13 DIAGNOSIS — R296 Repeated falls: Secondary | ICD-10-CM | POA: Diagnosis not present

## 2018-07-13 DIAGNOSIS — M810 Age-related osteoporosis without current pathological fracture: Secondary | ICD-10-CM | POA: Diagnosis not present

## 2018-07-13 DIAGNOSIS — E782 Mixed hyperlipidemia: Secondary | ICD-10-CM | POA: Diagnosis not present

## 2018-07-13 DIAGNOSIS — K219 Gastro-esophageal reflux disease without esophagitis: Secondary | ICD-10-CM | POA: Diagnosis not present

## 2018-07-13 DIAGNOSIS — I2510 Atherosclerotic heart disease of native coronary artery without angina pectoris: Secondary | ICD-10-CM | POA: Diagnosis not present

## 2018-07-13 DIAGNOSIS — I10 Essential (primary) hypertension: Secondary | ICD-10-CM | POA: Diagnosis not present

## 2018-07-15 DIAGNOSIS — I10 Essential (primary) hypertension: Secondary | ICD-10-CM | POA: Diagnosis not present

## 2018-07-15 DIAGNOSIS — M810 Age-related osteoporosis without current pathological fracture: Secondary | ICD-10-CM | POA: Diagnosis not present

## 2018-07-15 DIAGNOSIS — E782 Mixed hyperlipidemia: Secondary | ICD-10-CM | POA: Diagnosis not present

## 2018-07-15 DIAGNOSIS — K219 Gastro-esophageal reflux disease without esophagitis: Secondary | ICD-10-CM | POA: Diagnosis not present

## 2018-07-15 DIAGNOSIS — R296 Repeated falls: Secondary | ICD-10-CM | POA: Diagnosis not present

## 2018-07-15 DIAGNOSIS — I2510 Atherosclerotic heart disease of native coronary artery without angina pectoris: Secondary | ICD-10-CM | POA: Diagnosis not present

## 2018-07-19 DIAGNOSIS — M8000XA Age-related osteoporosis with current pathological fracture, unspecified site, initial encounter for fracture: Secondary | ICD-10-CM | POA: Diagnosis not present

## 2018-07-19 DIAGNOSIS — K219 Gastro-esophageal reflux disease without esophagitis: Secondary | ICD-10-CM | POA: Diagnosis not present

## 2018-07-19 DIAGNOSIS — I10 Essential (primary) hypertension: Secondary | ICD-10-CM | POA: Diagnosis not present

## 2018-07-19 DIAGNOSIS — R296 Repeated falls: Secondary | ICD-10-CM | POA: Diagnosis not present

## 2018-07-19 DIAGNOSIS — I2510 Atherosclerotic heart disease of native coronary artery without angina pectoris: Secondary | ICD-10-CM | POA: Diagnosis not present

## 2018-07-19 DIAGNOSIS — Z23 Encounter for immunization: Secondary | ICD-10-CM | POA: Diagnosis not present

## 2018-07-19 DIAGNOSIS — M810 Age-related osteoporosis without current pathological fracture: Secondary | ICD-10-CM | POA: Diagnosis not present

## 2018-07-19 DIAGNOSIS — E782 Mixed hyperlipidemia: Secondary | ICD-10-CM | POA: Diagnosis not present

## 2018-07-21 DIAGNOSIS — I2510 Atherosclerotic heart disease of native coronary artery without angina pectoris: Secondary | ICD-10-CM | POA: Diagnosis not present

## 2018-07-21 DIAGNOSIS — E782 Mixed hyperlipidemia: Secondary | ICD-10-CM | POA: Diagnosis not present

## 2018-07-21 DIAGNOSIS — R296 Repeated falls: Secondary | ICD-10-CM | POA: Diagnosis not present

## 2018-07-21 DIAGNOSIS — M810 Age-related osteoporosis without current pathological fracture: Secondary | ICD-10-CM | POA: Diagnosis not present

## 2018-07-21 DIAGNOSIS — I10 Essential (primary) hypertension: Secondary | ICD-10-CM | POA: Diagnosis not present

## 2018-07-21 DIAGNOSIS — K219 Gastro-esophageal reflux disease without esophagitis: Secondary | ICD-10-CM | POA: Diagnosis not present

## 2018-07-26 DIAGNOSIS — I2510 Atherosclerotic heart disease of native coronary artery without angina pectoris: Secondary | ICD-10-CM | POA: Diagnosis not present

## 2018-07-26 DIAGNOSIS — I10 Essential (primary) hypertension: Secondary | ICD-10-CM | POA: Diagnosis not present

## 2018-07-26 DIAGNOSIS — K219 Gastro-esophageal reflux disease without esophagitis: Secondary | ICD-10-CM | POA: Diagnosis not present

## 2018-07-26 DIAGNOSIS — M810 Age-related osteoporosis without current pathological fracture: Secondary | ICD-10-CM | POA: Diagnosis not present

## 2018-07-26 DIAGNOSIS — E782 Mixed hyperlipidemia: Secondary | ICD-10-CM | POA: Diagnosis not present

## 2018-07-26 DIAGNOSIS — R296 Repeated falls: Secondary | ICD-10-CM | POA: Diagnosis not present

## 2018-07-28 DIAGNOSIS — M810 Age-related osteoporosis without current pathological fracture: Secondary | ICD-10-CM | POA: Diagnosis not present

## 2018-07-28 DIAGNOSIS — I2510 Atherosclerotic heart disease of native coronary artery without angina pectoris: Secondary | ICD-10-CM | POA: Diagnosis not present

## 2018-07-28 DIAGNOSIS — I10 Essential (primary) hypertension: Secondary | ICD-10-CM | POA: Diagnosis not present

## 2018-07-28 DIAGNOSIS — E782 Mixed hyperlipidemia: Secondary | ICD-10-CM | POA: Diagnosis not present

## 2018-07-28 DIAGNOSIS — R296 Repeated falls: Secondary | ICD-10-CM | POA: Diagnosis not present

## 2018-07-28 DIAGNOSIS — K219 Gastro-esophageal reflux disease without esophagitis: Secondary | ICD-10-CM | POA: Diagnosis not present

## 2018-08-02 DIAGNOSIS — I10 Essential (primary) hypertension: Secondary | ICD-10-CM | POA: Diagnosis not present

## 2018-08-02 DIAGNOSIS — I2510 Atherosclerotic heart disease of native coronary artery without angina pectoris: Secondary | ICD-10-CM | POA: Diagnosis not present

## 2018-08-02 DIAGNOSIS — R296 Repeated falls: Secondary | ICD-10-CM | POA: Diagnosis not present

## 2018-08-02 DIAGNOSIS — K219 Gastro-esophageal reflux disease without esophagitis: Secondary | ICD-10-CM | POA: Diagnosis not present

## 2018-08-02 DIAGNOSIS — E782 Mixed hyperlipidemia: Secondary | ICD-10-CM | POA: Diagnosis not present

## 2018-08-02 DIAGNOSIS — M810 Age-related osteoporosis without current pathological fracture: Secondary | ICD-10-CM | POA: Diagnosis not present

## 2018-08-04 DIAGNOSIS — K219 Gastro-esophageal reflux disease without esophagitis: Secondary | ICD-10-CM | POA: Diagnosis not present

## 2018-08-04 DIAGNOSIS — R296 Repeated falls: Secondary | ICD-10-CM | POA: Diagnosis not present

## 2018-08-04 DIAGNOSIS — M810 Age-related osteoporosis without current pathological fracture: Secondary | ICD-10-CM | POA: Diagnosis not present

## 2018-08-04 DIAGNOSIS — I10 Essential (primary) hypertension: Secondary | ICD-10-CM | POA: Diagnosis not present

## 2018-08-04 DIAGNOSIS — E782 Mixed hyperlipidemia: Secondary | ICD-10-CM | POA: Diagnosis not present

## 2018-08-04 DIAGNOSIS — I2510 Atherosclerotic heart disease of native coronary artery without angina pectoris: Secondary | ICD-10-CM | POA: Diagnosis not present

## 2018-08-09 DIAGNOSIS — E782 Mixed hyperlipidemia: Secondary | ICD-10-CM | POA: Diagnosis not present

## 2018-08-09 DIAGNOSIS — K219 Gastro-esophageal reflux disease without esophagitis: Secondary | ICD-10-CM | POA: Diagnosis not present

## 2018-08-09 DIAGNOSIS — M810 Age-related osteoporosis without current pathological fracture: Secondary | ICD-10-CM | POA: Diagnosis not present

## 2018-08-09 DIAGNOSIS — I10 Essential (primary) hypertension: Secondary | ICD-10-CM | POA: Diagnosis not present

## 2018-08-09 DIAGNOSIS — R296 Repeated falls: Secondary | ICD-10-CM | POA: Diagnosis not present

## 2018-08-09 DIAGNOSIS — I2510 Atherosclerotic heart disease of native coronary artery without angina pectoris: Secondary | ICD-10-CM | POA: Diagnosis not present

## 2018-08-11 DIAGNOSIS — R296 Repeated falls: Secondary | ICD-10-CM | POA: Diagnosis not present

## 2018-08-11 DIAGNOSIS — E782 Mixed hyperlipidemia: Secondary | ICD-10-CM | POA: Diagnosis not present

## 2018-08-11 DIAGNOSIS — I2510 Atherosclerotic heart disease of native coronary artery without angina pectoris: Secondary | ICD-10-CM | POA: Diagnosis not present

## 2018-08-11 DIAGNOSIS — M810 Age-related osteoporosis without current pathological fracture: Secondary | ICD-10-CM | POA: Diagnosis not present

## 2018-08-11 DIAGNOSIS — I10 Essential (primary) hypertension: Secondary | ICD-10-CM | POA: Diagnosis not present

## 2018-08-11 DIAGNOSIS — K219 Gastro-esophageal reflux disease without esophagitis: Secondary | ICD-10-CM | POA: Diagnosis not present

## 2018-08-16 DIAGNOSIS — E782 Mixed hyperlipidemia: Secondary | ICD-10-CM | POA: Diagnosis not present

## 2018-08-16 DIAGNOSIS — M810 Age-related osteoporosis without current pathological fracture: Secondary | ICD-10-CM | POA: Diagnosis not present

## 2018-08-16 DIAGNOSIS — K219 Gastro-esophageal reflux disease without esophagitis: Secondary | ICD-10-CM | POA: Diagnosis not present

## 2018-08-16 DIAGNOSIS — R296 Repeated falls: Secondary | ICD-10-CM | POA: Diagnosis not present

## 2018-08-16 DIAGNOSIS — I10 Essential (primary) hypertension: Secondary | ICD-10-CM | POA: Diagnosis not present

## 2018-08-16 DIAGNOSIS — I2510 Atherosclerotic heart disease of native coronary artery without angina pectoris: Secondary | ICD-10-CM | POA: Diagnosis not present

## 2018-08-17 ENCOUNTER — Other Ambulatory Visit: Payer: Self-pay | Admitting: Cardiovascular Disease

## 2018-08-17 DIAGNOSIS — I2510 Atherosclerotic heart disease of native coronary artery without angina pectoris: Secondary | ICD-10-CM

## 2018-08-17 DIAGNOSIS — I2583 Coronary atherosclerosis due to lipid rich plaque: Principal | ICD-10-CM

## 2018-08-18 DIAGNOSIS — I2510 Atherosclerotic heart disease of native coronary artery without angina pectoris: Secondary | ICD-10-CM | POA: Diagnosis not present

## 2018-08-18 DIAGNOSIS — R296 Repeated falls: Secondary | ICD-10-CM | POA: Diagnosis not present

## 2018-08-18 DIAGNOSIS — I10 Essential (primary) hypertension: Secondary | ICD-10-CM | POA: Diagnosis not present

## 2018-08-18 DIAGNOSIS — M810 Age-related osteoporosis without current pathological fracture: Secondary | ICD-10-CM | POA: Diagnosis not present

## 2018-08-18 DIAGNOSIS — K219 Gastro-esophageal reflux disease without esophagitis: Secondary | ICD-10-CM | POA: Diagnosis not present

## 2018-08-18 DIAGNOSIS — E782 Mixed hyperlipidemia: Secondary | ICD-10-CM | POA: Diagnosis not present

## 2018-08-22 DIAGNOSIS — E782 Mixed hyperlipidemia: Secondary | ICD-10-CM | POA: Diagnosis not present

## 2018-08-22 DIAGNOSIS — I2510 Atherosclerotic heart disease of native coronary artery without angina pectoris: Secondary | ICD-10-CM | POA: Diagnosis not present

## 2018-08-22 DIAGNOSIS — R296 Repeated falls: Secondary | ICD-10-CM | POA: Diagnosis not present

## 2018-08-22 DIAGNOSIS — M810 Age-related osteoporosis without current pathological fracture: Secondary | ICD-10-CM | POA: Diagnosis not present

## 2018-08-22 DIAGNOSIS — I10 Essential (primary) hypertension: Secondary | ICD-10-CM | POA: Diagnosis not present

## 2018-08-22 DIAGNOSIS — K219 Gastro-esophageal reflux disease without esophagitis: Secondary | ICD-10-CM | POA: Diagnosis not present

## 2018-08-29 DIAGNOSIS — E782 Mixed hyperlipidemia: Secondary | ICD-10-CM | POA: Diagnosis not present

## 2018-08-29 DIAGNOSIS — K219 Gastro-esophageal reflux disease without esophagitis: Secondary | ICD-10-CM | POA: Diagnosis not present

## 2018-08-29 DIAGNOSIS — R296 Repeated falls: Secondary | ICD-10-CM | POA: Diagnosis not present

## 2018-08-29 DIAGNOSIS — I2510 Atherosclerotic heart disease of native coronary artery without angina pectoris: Secondary | ICD-10-CM | POA: Diagnosis not present

## 2018-08-29 DIAGNOSIS — M810 Age-related osteoporosis without current pathological fracture: Secondary | ICD-10-CM | POA: Diagnosis not present

## 2018-08-29 DIAGNOSIS — I10 Essential (primary) hypertension: Secondary | ICD-10-CM | POA: Diagnosis not present

## 2018-09-13 DIAGNOSIS — M810 Age-related osteoporosis without current pathological fracture: Secondary | ICD-10-CM | POA: Diagnosis not present

## 2018-09-26 DIAGNOSIS — H35363 Drusen (degenerative) of macula, bilateral: Secondary | ICD-10-CM | POA: Diagnosis not present

## 2018-09-26 DIAGNOSIS — H26492 Other secondary cataract, left eye: Secondary | ICD-10-CM | POA: Diagnosis not present

## 2018-09-26 DIAGNOSIS — H35351 Cystoid macular degeneration, right eye: Secondary | ICD-10-CM | POA: Diagnosis not present

## 2018-09-26 DIAGNOSIS — H401121 Primary open-angle glaucoma, left eye, mild stage: Secondary | ICD-10-CM | POA: Diagnosis not present

## 2019-01-03 DIAGNOSIS — M25561 Pain in right knee: Secondary | ICD-10-CM | POA: Diagnosis not present

## 2019-01-03 DIAGNOSIS — M25551 Pain in right hip: Secondary | ICD-10-CM | POA: Diagnosis not present

## 2019-01-03 DIAGNOSIS — S8001XA Contusion of right knee, initial encounter: Secondary | ICD-10-CM | POA: Diagnosis not present

## 2019-01-03 DIAGNOSIS — M79651 Pain in right thigh: Secondary | ICD-10-CM | POA: Diagnosis not present

## 2019-01-06 DIAGNOSIS — R296 Repeated falls: Secondary | ICD-10-CM | POA: Diagnosis not present

## 2019-01-06 DIAGNOSIS — R2689 Other abnormalities of gait and mobility: Secondary | ICD-10-CM | POA: Diagnosis not present

## 2019-01-06 DIAGNOSIS — M6281 Muscle weakness (generalized): Secondary | ICD-10-CM | POA: Diagnosis not present

## 2019-01-06 DIAGNOSIS — R2681 Unsteadiness on feet: Secondary | ICD-10-CM | POA: Diagnosis not present

## 2019-01-09 DIAGNOSIS — R2689 Other abnormalities of gait and mobility: Secondary | ICD-10-CM | POA: Diagnosis not present

## 2019-01-09 DIAGNOSIS — M6281 Muscle weakness (generalized): Secondary | ICD-10-CM | POA: Diagnosis not present

## 2019-01-09 DIAGNOSIS — R296 Repeated falls: Secondary | ICD-10-CM | POA: Diagnosis not present

## 2019-01-09 DIAGNOSIS — R2681 Unsteadiness on feet: Secondary | ICD-10-CM | POA: Diagnosis not present

## 2019-01-11 DIAGNOSIS — R296 Repeated falls: Secondary | ICD-10-CM | POA: Diagnosis not present

## 2019-01-11 DIAGNOSIS — M6281 Muscle weakness (generalized): Secondary | ICD-10-CM | POA: Diagnosis not present

## 2019-01-11 DIAGNOSIS — R2689 Other abnormalities of gait and mobility: Secondary | ICD-10-CM | POA: Diagnosis not present

## 2019-01-11 DIAGNOSIS — R2681 Unsteadiness on feet: Secondary | ICD-10-CM | POA: Diagnosis not present

## 2019-01-16 DIAGNOSIS — M6281 Muscle weakness (generalized): Secondary | ICD-10-CM | POA: Diagnosis not present

## 2019-01-16 DIAGNOSIS — R2681 Unsteadiness on feet: Secondary | ICD-10-CM | POA: Diagnosis not present

## 2019-01-16 DIAGNOSIS — R296 Repeated falls: Secondary | ICD-10-CM | POA: Diagnosis not present

## 2019-01-16 DIAGNOSIS — R2689 Other abnormalities of gait and mobility: Secondary | ICD-10-CM | POA: Diagnosis not present

## 2019-01-18 DIAGNOSIS — I119 Hypertensive heart disease without heart failure: Secondary | ICD-10-CM | POA: Diagnosis not present

## 2019-01-18 DIAGNOSIS — H61001 Unspecified perichondritis of right external ear: Secondary | ICD-10-CM | POA: Diagnosis not present

## 2019-01-18 DIAGNOSIS — M8000XA Age-related osteoporosis with current pathological fracture, unspecified site, initial encounter for fracture: Secondary | ICD-10-CM | POA: Diagnosis not present

## 2019-01-18 DIAGNOSIS — E782 Mixed hyperlipidemia: Secondary | ICD-10-CM | POA: Diagnosis not present

## 2019-01-18 DIAGNOSIS — I2510 Atherosclerotic heart disease of native coronary artery without angina pectoris: Secondary | ICD-10-CM | POA: Diagnosis not present

## 2019-01-19 DIAGNOSIS — M6281 Muscle weakness (generalized): Secondary | ICD-10-CM | POA: Diagnosis not present

## 2019-01-19 DIAGNOSIS — R2689 Other abnormalities of gait and mobility: Secondary | ICD-10-CM | POA: Diagnosis not present

## 2019-01-19 DIAGNOSIS — R296 Repeated falls: Secondary | ICD-10-CM | POA: Diagnosis not present

## 2019-01-19 DIAGNOSIS — R2681 Unsteadiness on feet: Secondary | ICD-10-CM | POA: Diagnosis not present

## 2019-01-23 DIAGNOSIS — R2689 Other abnormalities of gait and mobility: Secondary | ICD-10-CM | POA: Diagnosis not present

## 2019-01-23 DIAGNOSIS — M6281 Muscle weakness (generalized): Secondary | ICD-10-CM | POA: Diagnosis not present

## 2019-01-23 DIAGNOSIS — R296 Repeated falls: Secondary | ICD-10-CM | POA: Diagnosis not present

## 2019-01-23 DIAGNOSIS — R2681 Unsteadiness on feet: Secondary | ICD-10-CM | POA: Diagnosis not present

## 2019-02-17 ENCOUNTER — Telehealth: Payer: Self-pay | Admitting: Cardiovascular Disease

## 2019-02-17 NOTE — Progress Notes (Signed)
Virtual Visit via Video Note   This visit type was conducted due to national recommendations for restrictions regarding the COVID-19 Pandemic (e.g. social distancing) in an effort to limit this patient's exposure and mitigate transmission in our community.  Due to his co-morbid illnesses, this patient is at least at moderate risk for complications without adequate follow up.  This format is felt to be most appropriate for this patient at this time.  All issues noted in this document were discussed and addressed.  A limited physical exam was performed with this format.  Please refer to the patient's chart for his consent to telehealth for [facility].   Evaluation Performed:  Follow-up visit  Date:  02/17/2019   ID:  [name], DOB 05/31/1929, MRN [id number]  Patient Location: Home  Provider Location: Office  PCP:  [name], MD  Cardiologist:  No primary care provider on file. [place] Electrophysiologist:  None   Chief Complaint:  CAD/CABG  History of Present Illness:    [name]  presents via Engineer, civil (consulting) for a telehealth visit today.    HPI: 85 y.o. CAD/ CABG in 1999 last myovue 2018 no ischemia. Seen by PA in July for "syncope" thought to be vasovagal Holter done 05/24/18 no significant arrhythmia EF 48% on myovue June 2018  His syncope episodes are precipitated by pain in his jaw/teeth when Eating Denies TMJ or chronic dental issues.   No chest pain , postural symptoms dyspnea or palpitations No fever or COVID contacts  Daughter checks in on him weekly and there is a cafe in [place] he can pick up food   The patient does not have symptoms concerning for COVID-19 infection (fever, chills, cough, or new shortness of breath).    Past Medical History:  Diagnosis Date  . CAD   . HYPERLIPIDEMIA   . HYPERTENSION   . Shortness of breath    Past Surgical History:  Procedure Laterality Date  . APPENDECTOMY    . CORONARY ARTERY BYPASS GRAFT     . KIDNEY STONE SURGERY    . REFRACTIVE SURGERY       No outpatient medications have been marked as taking for the 02/21/19 encounter (Appointment) with Josue Hector, MD.     Allergies:   Sulfonamide derivatives   Social History   Tobacco Use  . Smoking status: Former Smoker    Packs/day: 2.00    Years: 40.00    Pack years: 80.00    Types: Cigarettes  . Smokeless tobacco: Never Used  Substance Use Topics  . Alcohol use: No    Alcohol/week: 0.0 standard drinks    Comment: 0.5 glass of wine nightly.  . Drug use: No     Family Hx: The patient's family history includes Anuerysm in his father; Coronary artery disease in his father; Healthy in his daughter; Heart attack in his brother.  ROS:   Please see the history of present illness.     All other systems reviewed and are negative.   Prior CV studies:   The following studies were reviewed today:  Holter 05/24/18 Myovue 05/04/17 Echo 11/18/11  Labs/Other Tests and Data Reviewed:    EKG:  SR LAD PVC nonspecific ST changes 05/24/18  Recent Labs: 05/24/2018: BUN 25; Creatinine, Ser 1.06; Hemoglobin 13.4; Platelets 179; Potassium 4.1; Sodium 139   Recent Lipid Panel Lab Results  Component Value Date/Time   CHOL 160 05/27/2010 12:00 PM   TRIG 96.0 05/27/2010 12:00 PM   HDL 47.30  05/27/2010 12:00 PM   CHOLHDL 3 05/27/2010 12:00 PM   LDLCALC 94 05/27/2010 12:00 PM    Wt Readings from Last 3 Encounters:  06/15/18 80.9 kg  05/24/18 81.6 kg  12/02/17 85.7 kg     Objective:    Vital Signs:  There were no vitals taken for this visit.   Well nourished, well developed male in no acute distress. Kyphotic No JVP elevation No tachypnea Trace LE edema  Skin warm and dry   ASSESSMENT & PLAN:    ASSESSMENT AND PLAN:   1. Syncope: based on history surrounding event, suspect likely vasovagal syncope.  Holter 05/24/18 no significant arrhythmia  2. CAD: h/o CABG in 1999.  Myovue  04/2017 showed normal EF and no  ischemia. He denies any CP and no dyspnea. Continue medical therapy.    COVID-19 Education: The signs and symptoms of COVID-19 were discussed with the patient and how to seek care for testing (follow up with PCP or arrange E-visit).  The importance of social distancing was discussed today.  Time:   Today, I have spent 30 minutes with the patient with telehealth technology discussing the above problems.     Medication Adjustments/Labs and Tests Ordered: Current medicines are reviewed at length with the patient today.  Concerns regarding medicines are outlined above.  Tests Ordered: No orders of the defined types were placed in this encounter.  Medication Changes: No orders of the defined types were placed in this encounter.   Disposition:  Follow up in 6 months  Signed, [name], MD  02/17/2019 5:58 PM    [name]

## 2019-02-17 NOTE — Telephone Encounter (Signed)
New message      Called pt to schedule 64mo recall visit as a virtual visit.  Pt does not have an email address and his cell phone is a flip phone.  However he did say it was ok for Dr Johnsie Cancel to do a telephone visit and that he was doing fine.  Telephone visit scheduled for 02-21-19 at [facility] AN E-VISIT FOR YOUR APPOINTMENT - PLEASE REVIEW IMPORTANT INFORMATION BELOW SEVERAL DAYS PRIOR TO YOUR APPOINTMENT  Due to the recent COVID-19 pandemic, we are transitioning in-person office visits to tele-medicine visits in an effort to decrease unnecessary exposure to our patients and staff. Medicare and most insurances are covering these visits without a copay needed. We also encourage you to sign up for MyChart if you have not already done so. You will need a smartphone if possible. For patients that do not have this, we can still complete the visit using a regular telephone but do prefer a smartphone to enable video when possible. You may have a close family member that lives with you that can help. If possible, we also ask that you have a blood pressure cuff and scale at home to measure your blood pressure, heart rate and weight prior to your scheduled appointment. Patients with clinical needs that need an in-person evaluation and testing will still be able to come to the office if absolutely necessary. If you have any questions, feel free to call our office.    IF YOU HAVE A SMARTPHONE, PLEASE DOWNLOAD THE WEBEX APP TO YOUR SMARTPHONE  - If Apple, go to [employer] and type in WebEx in the search bar. [place] [employer], the blue/green circle. The app is free but as with any other app download, your phone may require you to verify saved payment information or Apple password. You do NOT have to create a WebEx account.  - If Android, go to [employer] and type in [employer] in the search bar. [place] [employer], the blue/green circle. The app is free  but as with any other app download, your phone may require you to verify saved payment information or Android password. You do NOT have to create a WebEx account.  It is very helpful to have this downloaded before your visit.    2-3 DAYS BEFORE YOUR APPOINTMENT  You will receive a telephone call from one of our [facility] team members - your caller ID may say "Unknown caller." If this is a video visit, we will confirm that you have been able to download the WebEx app. We will remind you check your blood pressure, heart rate and weight prior to your scheduled appointment. If you have an Apple Watch or Kardia, please upload any pertinent ECG strips the day before or morning of your appointment to [place]. Our staff will also make sure you have reviewed the consent and agree to move forward with your scheduled tele-health visit.     THE DAY OF YOUR APPOINTMENT  Approximately 15 minutes prior to your scheduled appointment, you will receive a telephone call from one of [facility] team - your caller ID may say "Unknown caller."  Our staff will confirm medications, vital signs for the day and any symptoms you may be experiencing. Please have this information available prior to the time of visit start. It may also be helpful for you to have a pad of paper and pen handy for any instructions given during your visit. They will  also walk you through joining the [employer] meeting if this is a video visit.    CONSENT FOR TELE-HEALTH VISIT - PLEASE REVIEW  I hereby voluntarily request, consent and authorize [facility] and its employed or contracted physicians, physician assistants, nurse practitioners or other licensed health care professionals (the Practitioner), to provide me with telemedicine health care services (the Services") as deemed necessary by the treating Practitioner. I acknowledge and consent to receive the Services by the Practitioner via telemedicine. I understand that the telemedicine  visit will involve communicating with the Practitioner through live audiovisual communication technology and the disclosure of certain medical information by electronic transmission. I acknowledge that I have been given the opportunity to request an in-person assessment or other available alternative prior to the telemedicine visit and am voluntarily participating in the telemedicine visit.  I understand that I have the right to withhold or withdraw my consent to the use of telemedicine in the course of my care at any time, without affecting my right to future care or treatment, and that the Practitioner or I may terminate the telemedicine visit at any time. I understand that I have the right to inspect all information obtained and/or recorded in the course of the telemedicine visit and may receive copies of available information for a reasonable fee.  I understand that some of the potential risks of receiving the Services via telemedicine include:   Delay or interruption in medical evaluation due to technological equipment failure or disruption;  Information transmitted may not be sufficient (e.g. poor resolution of images) to allow for appropriate medical decision making by the Practitioner; and/or   In rare instances, security protocols could fail, causing a breach of personal health information.  Furthermore, I acknowledge that it is my responsibility to provide information about my medical history, conditions and care that is complete and accurate to the best of my ability. I acknowledge that Practitioner's advice, recommendations, and/or decision may be based on factors not within their control, such as incomplete or inaccurate data provided by me or distortions of diagnostic images or specimens that may result from electronic transmissions. I understand that the practice of medicine is not an exact science and that Practitioner makes no warranties or guarantees regarding treatment outcomes. I  acknowledge that I will receive a copy of this consent concurrently upon execution via email to the email address I last provided but may also request a printed copy by calling the office of [facility].    I understand that my insurance will be billed for this visit.   I have read or had this consent read to me.  I understand the contents of this consent, which adequately explains the benefits and risks of the Services being provided via telemedicine.   I have been provided ample opportunity to ask questions regarding this consent and the Services and have had my questions answered to my satisfaction.  I give my informed consent for the services to be provided through the use of telemedicine in my medical care  By participating in this telemedicine visit I agree to the above. [name]

## 2019-02-21 ENCOUNTER — Encounter: Payer: Self-pay | Admitting: Cardiovascular Disease

## 2019-02-21 ENCOUNTER — Other Ambulatory Visit: Payer: Self-pay

## 2019-02-21 ENCOUNTER — Telehealth (INDEPENDENT_AMBULATORY_CARE_PROVIDER_SITE_OTHER): Payer: Medicare Other | Admitting: Cardiovascular Disease

## 2019-02-21 VITALS — BP 161/81 | HR 71 | Ht 66.0 in | Wt 185.0 lb

## 2019-02-21 DIAGNOSIS — I2510 Atherosclerotic heart disease of native coronary artery without angina pectoris: Secondary | ICD-10-CM

## 2019-02-21 NOTE — Patient Instructions (Signed)

## 2019-05-16 DIAGNOSIS — H33021 Retinal detachment with multiple breaks, right eye: Secondary | ICD-10-CM | POA: Diagnosis not present

## 2019-05-16 DIAGNOSIS — H35351 Cystoid macular degeneration, right eye: Secondary | ICD-10-CM | POA: Diagnosis not present

## 2019-05-16 DIAGNOSIS — H4720 Unspecified optic atrophy: Secondary | ICD-10-CM | POA: Diagnosis not present

## 2019-05-16 DIAGNOSIS — H401121 Primary open-angle glaucoma, left eye, mild stage: Secondary | ICD-10-CM | POA: Diagnosis not present

## 2019-07-18 DIAGNOSIS — Z23 Encounter for immunization: Secondary | ICD-10-CM | POA: Diagnosis not present

## 2019-07-18 DIAGNOSIS — I119 Hypertensive heart disease without heart failure: Secondary | ICD-10-CM | POA: Diagnosis not present

## 2019-07-18 DIAGNOSIS — E782 Mixed hyperlipidemia: Secondary | ICD-10-CM | POA: Diagnosis not present

## 2019-07-18 DIAGNOSIS — M8000XA Age-related osteoporosis with current pathological fracture, unspecified site, initial encounter for fracture: Secondary | ICD-10-CM | POA: Diagnosis not present

## 2019-07-24 DIAGNOSIS — I2510 Atherosclerotic heart disease of native coronary artery without angina pectoris: Secondary | ICD-10-CM | POA: Diagnosis not present

## 2019-07-24 DIAGNOSIS — I119 Hypertensive heart disease without heart failure: Secondary | ICD-10-CM | POA: Diagnosis not present

## 2019-07-24 DIAGNOSIS — E782 Mixed hyperlipidemia: Secondary | ICD-10-CM | POA: Diagnosis not present

## 2019-07-24 DIAGNOSIS — Z7189 Other specified counseling: Secondary | ICD-10-CM | POA: Diagnosis not present

## 2019-07-24 DIAGNOSIS — H9193 Unspecified hearing loss, bilateral: Secondary | ICD-10-CM | POA: Diagnosis not present

## 2019-07-24 DIAGNOSIS — M8000XA Age-related osteoporosis with current pathological fracture, unspecified site, initial encounter for fracture: Secondary | ICD-10-CM | POA: Diagnosis not present

## 2019-07-24 DIAGNOSIS — Z0000 Encounter for general adult medical examination without abnormal findings: Secondary | ICD-10-CM | POA: Diagnosis not present

## 2019-07-25 ENCOUNTER — Other Ambulatory Visit: Payer: Self-pay | Admitting: Cardiovascular Disease

## 2019-07-25 DIAGNOSIS — I2583 Coronary atherosclerosis due to lipid rich plaque: Secondary | ICD-10-CM

## 2019-07-25 DIAGNOSIS — I2510 Atherosclerotic heart disease of native coronary artery without angina pectoris: Secondary | ICD-10-CM

## 2019-08-16 ENCOUNTER — Encounter: Payer: Self-pay | Admitting: *Deleted

## 2019-09-19 NOTE — Progress Notes (Deleted)
Evaluation Performed:  Follow-up visit  Date:  09/19/2019   ID:  [name], DOB [birth year], MRN [id number]  Provider Location: Office  PCP:  [name], MD  Cardiologist:  No primary care provider on file. [place] Electrophysiologist:  None   Chief Complaint:  CAD/CABG  History of Present Illness:     86 y.o. CAD/ CABG in 1999 last myovue 2018 no ischemia. Seen by PA in July for "syncope" thought to be vasovagal Holter done 05/24/18 no significant arrhythmia EF 48% on myovue June 2018  His syncope episodes are precipitated by pain in his jaw/teeth when Eating Denies TMJ or chronic dental issues.   No chest pain , postural symptoms dyspnea or palpitations   Daughter checks in on him weekly and there is a cafe in [place] he can pick up food   ***  The patient does not have symptoms concerning for COVID-19 infection (fever, chills, cough, or new shortness of breath).    Past Medical History:  Diagnosis Date  . Basal cell carcinoma 06/02/2003   left bulb nose-CX35FU  . CAD   . HYPERLIPIDEMIA   . HYPERTENSION   . Melanoma ([place]) 06/05/2003   left cheek-MOHS  . Shortness of breath    Past Surgical History:  Procedure Laterality Date  . APPENDECTOMY    . CORONARY ARTERY BYPASS GRAFT    . KIDNEY STONE SURGERY    . REFRACTIVE SURGERY       No outpatient medications have been marked as taking for the 09/21/19 encounter (Appointment) with Josue Hector, MD.     Allergies:   Succinylsulphathiazole and Sulfonamide derivatives   Social History   Tobacco Use  . Smoking status: Former Smoker    Packs/day: 2.00    Years: 40.00    Pack years: 80.00    Types: Cigarettes  . Smokeless tobacco: Never Used  Substance Use Topics  . Alcohol use: No    Alcohol/week: 0.0 standard drinks    Comment: 0.5 glass of wine nightly.  . Drug use: No     Family Hx: The patient's family history includes Anuerysm in his father; Coronary artery disease in his father;  Healthy in his daughter; Heart attack in his brother.  ROS:   Please see the history of present illness.     All other systems reviewed and are negative.   Prior CV studies:   The following studies were reviewed today:  Holter 05/24/18 Myovue 05/04/17 Echo 11/18/11  Labs/Other Tests and Data Reviewed:    EKG:  SR LAD PVC nonspecific ST changes 05/24/18  Recent Labs: No results found for requested labs within last 8760 hours.   Recent Lipid Panel Lab Results  Component Value Date/Time   CHOL 160 05/27/2010 12:00 PM   TRIG 96.0 05/27/2010 12:00 PM   HDL 47.30 05/27/2010 12:00 PM   CHOLHDL 3 05/27/2010 12:00 PM   LDLCALC 94 05/27/2010 12:00 PM    Wt Readings from Last 3 Encounters:  02/21/19 185 lb (83.9 kg)  06/15/18 178 lb 4 oz (80.9 kg)  05/24/18 180 lb (81.6 kg)     Objective:    Vital Signs:  There were no vitals taken for this visit.   Affect appropriate Healthy:  appears stated age 49: normal Neck supple with no adenopathy JVP normal no bruits no thyromegaly Lungs clear with no wheezing and good diaphragmatic motion Heart:  S1/S2 no murmur, no rub, gallop or click PMI normal post sternotomy  Abdomen: benighn, BS positve,  no tenderness, no AAA no bruit.  No HSM or HJR Distal pulses intact with no bruits No edema Neuro non-focal Skin warm and dry No muscular weakness   ASSESSMENT & PLAN:    ASSESSMENT AND PLAN:   1. Syncope: based on history surrounding event, suspect likely vasovagal syncope.  Holter 05/24/18 no significant arrhythmia  2. CAD: h/o CABG in 1999.  Myovue  04/2017 showed normal EF and no ischemia. He denies any CP and no dyspnea. Continue medical therapy.    COVID-19 Education: The signs and symptoms of COVID-19 were discussed with the patient and how to seek care for testing (follow up with PCP or arrange E-visit).  The importance of social distancing was discussed today.  Time:   Today, I have spent 30 minutes with the patient     Medication Adjustments/Labs and Tests Ordered: Current medicines are reviewed at length with the patient today.  Concerns regarding medicines are outlined above.  Tests Ordered: No orders of the defined types were placed in this encounter.  Medication Changes: No orders of the defined types were placed in this encounter.   Disposition:  Follow up in 6 months  Signed, Charles Rouge, MD  09/19/2019 10:05 AM    [name]

## 2019-09-21 ENCOUNTER — Ambulatory Visit: Payer: Medicare Other | Admitting: Cardiovascular Disease

## 2019-10-23 NOTE — Progress Notes (Signed)
Evaluation Performed:  Follow-up visit  Date:  10/30/2019   ID:  [name], DOB 1929-09-17, MRN [id number]  Provider Location: Office  PCP:  [name], MD  Cardiologist:  No primary care provider on file. [place] Electrophysiologist:  None   Chief Complaint:  CAD/CABG  History of Present Illness:    HPI: 85 y.o. CAD/ CABG in 1999 last myovue 2018 no ischemia. Seen by PA in July 2019  for "syncope" thought to be vasovagal Holter done 05/24/18 no significant arrhythmia EF 48% on myovue June 2018  His syncope episodes are precipitated by pain in his jaw/teeth when Eating Denies TMJ or chronic dental issues.   No chest pain , postural symptoms dyspnea or palpitations No fever or COVID contacts  Daughter checks in on him weekly and there is a cafe in [place] he can pick up food    No chest pain   The patient does not have symptoms concerning for COVID-19 infection (fever, chills, cough, or new shortness of breath).    Past Medical History:  Diagnosis Date  . Basal cell carcinoma 06/02/2003   left bulb nose-CX35FU  . CAD   . HYPERLIPIDEMIA   . HYPERTENSION   . Melanoma ([place]) 06/05/2003   left cheek-MOHS  . Shortness of breath    Past Surgical History:  Procedure Laterality Date  . APPENDECTOMY    . CORONARY ARTERY BYPASS GRAFT    . KIDNEY STONE SURGERY    . REFRACTIVE SURGERY       Current Meds  Medication Sig  . atorvastatin (LIPITOR) 20 MG tablet Take 20 mg by mouth daily.   . B Complex-C (B-COMPLEX WITH VITAMIN C) tablet Take 1 tablet by mouth daily.  . cholecalciferol (VITAMIN D) 1000 UNITS tablet Take 1,000 Units by mouth daily.   . clopidogrel (PLAVIX) 75 MG tablet TAKE 1 TABLET BY MOUTH  DAILY  . denosumab (PROLIA) 60 MG/ML SOLN injection Inject 60 mg into the skin every 6 (six) months. Administer in upper arm, thigh, or abdomen  . Esomeprazole Magnesium (NEXIUM PO) Take 1 tablet by mouth every other day.   . latanoprost (XALATAN) 0.005 %  ophthalmic solution Place 1 drop into the left eye daily.  Marland Kitchen omega-3 acid ethyl esters (LOVAZA) 1 G capsule Take 1 g by mouth daily.  . ramipril (ALTACE) 5 MG capsule Take 5 mg by mouth daily.  . vitamin C (ASCORBIC ACID) 500 MG tablet Take 500 mg by mouth daily.   . [DISCONTINUED] albuterol (PROVENTIL HFA;VENTOLIN HFA) 108 (90 Base) MCG/ACT inhaler Inhale 1-2 puffs into the lungs every 6 (six) hours as needed for wheezing or shortness of breath.     Allergies:   Succinylsulphathiazole and Sulfonamide derivatives   Social History   Tobacco Use  . Smoking status: Former Smoker    Packs/day: 2.00    Years: 40.00    Pack years: 80.00    Types: Cigarettes  . Smokeless tobacco: Never Used  Substance Use Topics  . Alcohol use: No    Alcohol/week: 0.0 standard drinks    Comment: 0.5 glass of wine nightly.  . Drug use: No     Family Hx: The patient's family history includes Anuerysm in his father; Coronary artery disease in his father; Healthy in his daughter; Heart attack in his brother.  ROS:   Please see the history of present illness.     All other systems reviewed and are negative.   Prior CV studies:   The following studies  were reviewed today:  Holter 05/24/18 Myovue 05/04/17 Echo 11/18/11  Labs/Other Tests and Data Reviewed:    EKG:  SR LAD PVC nonspecific ST changes 05/24/18 10/30/19 SR rate 85 poor R wave progression   Recent Labs: No results found for requested labs within last 8760 hours.   Recent Lipid Panel Lab Results  Component Value Date/Time   CHOL 160 05/27/2010 12:00 PM   TRIG 96.0 05/27/2010 12:00 PM   HDL 47.30 05/27/2010 12:00 PM   CHOLHDL 3 05/27/2010 12:00 PM   LDLCALC 94 05/27/2010 12:00 PM    Wt Readings from Last 3 Encounters:  10/30/19 184 lb 3.2 oz (83.6 kg)  02/21/19 185 lb (83.9 kg)  06/15/18 178 lb 4 oz (80.9 kg)     Objective:    Vital Signs:  BP 132/88   Pulse 95   Ht 5\' 6"  (1.676 m)   Wt 184 lb 3.2 oz (83.6 kg)   SpO2 97%    BMI 29.73 kg/m    Affect appropriate Elderly kyphotic white male  HEENT: normal Neck supple with no adenopathy JVP normal no bruits no thyromegaly Lungs clear with no wheezing and good diaphragmatic motion Heart:  S1/S2 no murmur, no rub, gallop or click PMI normal Abdomen: benighn, BS positve, no tenderness, no AAA no bruit.  No HSM or HJR Distal pulses intact with no bruits No edema Neuro non-focal Skin warm and dry No muscular weakness   ASSESSMENT & PLAN:    ASSESSMENT AND PLAN:   1. Syncope: based on history surrounding event, suspect likely vasovagal syncope.  Holter 05/24/18 no significant arrhythmia  2. CAD: h/o CABG in 1999.  Myovue  04/2017 showed normal EF and no ischemia. He denies any CP and no dyspnea. Continue medical therapy.    COVID-19 Education: The signs and symptoms of COVID-19 were discussed with the patient and how to seek care for testing (follow up with PCP or arrange E-visit).  The importance of social distancing was discussed today.  Time:   Today, I have spent 30 minutes with the patient     Medication Adjustments/Labs and Tests Ordered: Current medicines are reviewed at length with the patient today.  Concerns regarding medicines are outlined above.  Tests Ordered: No orders of the defined types were placed in this encounter.  Medication Changes: No orders of the defined types were placed in this encounter.   Disposition:  Follow up in 6 months  Signed, [name], MD  10/30/2019 2:41 PM    [name]

## 2019-10-25 ENCOUNTER — Telehealth: Payer: Self-pay | Admitting: Cardiovascular Disease

## 2019-10-25 NOTE — Telephone Encounter (Signed)
Called daughter and informed her that she could come up with patient for his appt.

## 2019-10-25 NOTE — Telephone Encounter (Signed)
Patients daughter is calling requesting she come with the patient to his appointment on 10/30/19 due to him needing help walking and having hearing problems.

## 2019-10-30 ENCOUNTER — Ambulatory Visit (INDEPENDENT_AMBULATORY_CARE_PROVIDER_SITE_OTHER): Payer: Medicare Other | Admitting: Cardiovascular Disease

## 2019-10-30 ENCOUNTER — Encounter: Payer: Self-pay | Admitting: Cardiovascular Disease

## 2019-10-30 ENCOUNTER — Other Ambulatory Visit: Payer: Self-pay

## 2019-10-30 VITALS — BP 132/88 | HR 95 | Ht 66.0 in | Wt 184.2 lb

## 2019-10-30 DIAGNOSIS — I2510 Atherosclerotic heart disease of native coronary artery without angina pectoris: Secondary | ICD-10-CM

## 2019-10-30 DIAGNOSIS — R55 Syncope and collapse: Secondary | ICD-10-CM

## 2019-10-30 DIAGNOSIS — I2583 Coronary atherosclerosis due to lipid rich plaque: Secondary | ICD-10-CM | POA: Diagnosis not present

## 2019-10-30 DIAGNOSIS — I25810 Atherosclerosis of coronary artery bypass graft(s) without angina pectoris: Secondary | ICD-10-CM

## 2019-10-30 NOTE — Patient Instructions (Signed)
Medication Instructions:   *If you need a refill on your cardiac medications before your next appointment, please call your pharmacy*  Lab Work:  If you have labs (blood work) drawn today and your tests are completely normal, you will receive your results only by: [facility] MyChart Message (if you have MyChart) OR . A paper copy in the mail If you have any lab test that is abnormal or we need to change your treatment, we will call you to review the results.  Testing/Procedures: None ordered today.  Follow-Up: At [facility], you and your health needs are our priority.  As part of our continuing mission to provide you with exceptional heart care, we have created designated Provider Care Teams.  These Care Teams include your primary Cardiologist (physician) and Advanced Practice Providers (APPs -  Physician Assistants and Nurse Practitioners) who all work together to provide you with the care you need, when you need it.  Your next appointment:   6 month(s)  The format for your next appointment:   In Person  Provider:   You may see Dr. Johnsie Cancel or one of the following Advanced Practice Providers on your designated Care Team:    Truitt Merle, NP  Cecilie Kicks, NP  Kathyrn Drown, NP

## 2019-12-06 ENCOUNTER — Ambulatory Visit: Payer: Medicare Other | Admitting: Cardiovascular Disease

## 2020-02-07 ENCOUNTER — Other Ambulatory Visit: Payer: Self-pay | Admitting: Family Medicine

## 2020-02-07 DIAGNOSIS — R55 Syncope and collapse: Secondary | ICD-10-CM

## 2020-02-12 ENCOUNTER — Other Ambulatory Visit: Payer: Medicare Other

## 2020-02-14 ENCOUNTER — Ambulatory Visit
Admission: RE | Admit: 2020-02-14 | Discharge: 2020-02-14 | Disposition: A | Discharge status: Home / Self Care | Payer: Medicare Other | Source: Ambulatory Visit | Attending: Family Medicine | Admitting: Family Medicine

## 2020-02-14 DIAGNOSIS — R55 Syncope and collapse: Secondary | ICD-10-CM

## 2020-02-20 ENCOUNTER — Encounter (INDEPENDENT_AMBULATORY_CARE_PROVIDER_SITE_OTHER): Payer: Medicare Other | Admitting: Ophthalmology

## 2020-04-02 ENCOUNTER — Encounter (INDEPENDENT_AMBULATORY_CARE_PROVIDER_SITE_OTHER): Payer: Medicare Other | Admitting: Ophthalmology

## 2020-04-25 ENCOUNTER — Encounter (INDEPENDENT_AMBULATORY_CARE_PROVIDER_SITE_OTHER): Payer: Self-pay | Admitting: Ophthalmology

## 2020-04-25 ENCOUNTER — Ambulatory Visit (INDEPENDENT_AMBULATORY_CARE_PROVIDER_SITE_OTHER): Payer: Medicare Other | Admitting: Ophthalmology

## 2020-04-25 ENCOUNTER — Other Ambulatory Visit: Payer: Self-pay

## 2020-04-25 DIAGNOSIS — H401121 Primary open-angle glaucoma, left eye, mild stage: Secondary | ICD-10-CM | POA: Diagnosis not present

## 2020-04-25 DIAGNOSIS — H4720 Unspecified optic atrophy: Secondary | ICD-10-CM | POA: Diagnosis not present

## 2020-04-25 DIAGNOSIS — Z8669 Personal history of other diseases of the nervous system and sense organs: Secondary | ICD-10-CM | POA: Diagnosis not present

## 2020-04-25 DIAGNOSIS — Z961 Presence of intraocular lens: Secondary | ICD-10-CM

## 2020-04-25 DIAGNOSIS — H35351 Cystoid macular degeneration, right eye: Secondary | ICD-10-CM | POA: Diagnosis not present

## 2020-04-25 NOTE — Progress Notes (Signed)
04/25/2020     CHIEF COMPLAINT Patient presents for Retina Follow Up   HISTORY OF PRESENT ILLNESS: [name] is a 86 y.o. male who presents to the clinic today for:   HPI    Retina Follow Up    Patient presents with  PVD.  In both eyes.  Severity is moderate.  Duration of 1 year.  Since onset it is stable.  I, the attending physician,  performed the HPI with the patient and updated documentation appropriately.          Comments    1 Year f\u OU. FP  Pt states his vision has become more blurry. Pt is still seeing floaters. Pt using gtts as directed.       Last edited by Tilda Franco on 04/25/2020  2:47 PM. (History)      Referring physician: [name], MD [address],  [place] [id number]  HISTORICAL INFORMATION:   Selected notes from the MEDICAL RECORD NUMBER       CURRENT MEDICATIONS: Current Outpatient Medications (Ophthalmic Drugs)  Medication Sig  . latanoprost (XALATAN) 0.005 % ophthalmic solution Place 1 drop into the left eye daily.   No current facility-administered medications for this visit. (Ophthalmic Drugs)   Current Outpatient Medications (Other)  Medication Sig  . atorvastatin (LIPITOR) 20 MG tablet Take 20 mg by mouth daily.   . B Complex-C (B-COMPLEX WITH VITAMIN C) tablet Take 1 tablet by mouth daily.  . cholecalciferol (VITAMIN D) 1000 UNITS tablet Take 1,000 Units by mouth daily.   . clopidogrel (PLAVIX) 75 MG tablet TAKE 1 TABLET BY MOUTH  DAILY  . denosumab (PROLIA) 60 MG/ML SOLN injection Inject 60 mg into the skin every 6 (six) months. Administer in upper arm, thigh, or abdomen  . Esomeprazole Magnesium (NEXIUM PO) Take 1 tablet by mouth every other day.   . omega-3 acid ethyl esters (LOVAZA) 1 G capsule Take 1 g by mouth daily.  . ramipril (ALTACE) 5 MG capsule Take 5 mg by mouth daily.  . vitamin C (ASCORBIC ACID) 500 MG tablet Take 500 mg by mouth daily.    No current facility-administered  medications for this visit. (Other)      REVIEW OF SYSTEMS:    ALLERGIES Allergies  Allergen Reactions  . Succinylsulphathiazole Rash  . Sulfonamide Derivatives Itching and Rash    PAST MEDICAL HISTORY Past Medical History:  Diagnosis Date  . Basal cell carcinoma 06/02/2003   left bulb nose-CX35FU  . CAD   . HYPERLIPIDEMIA   . HYPERTENSION   . Melanoma ([place]) 06/05/2003   left cheek-MOHS  . Shortness of breath    Past Surgical History:  Procedure Laterality Date  . APPENDECTOMY    . CORONARY ARTERY BYPASS GRAFT    . KIDNEY STONE SURGERY    . REFRACTIVE SURGERY      FAMILY HISTORY Family History  Problem Relation Age of Onset  . Coronary artery disease Father   . Anuerysm Father   . Heart attack Brother   . Healthy Daughter     SOCIAL HISTORY Social History   Tobacco Use  . Smoking status: Former Smoker    Packs/day: 2.00    Years: 40.00    Pack years: 80.00    Types: Cigarettes  . Smokeless tobacco: Never Used  Vaping Use  . Vaping Use: Never used  Substance Use Topics  . Alcohol use: No    Alcohol/week: 0.0 standard drinks  Comment: 0.5 glass of wine nightly.  . Drug use: No         OPHTHALMIC EXAM:  Base Eye Exam    Visual Acuity (Snellen - Linear)      Right Left   Dist cc HM 20/25 -1       Tonometry (Tonopen, 2:52 PM)      Right Left   Pressure 13 12       Pupils      Pupils Dark Light Shape React APD   Right PERRL 2 2 Irregular Minimal +1   Left PERRL 2 1.5 Round Brisk None       Visual Fields (Counting fingers)      Left Right   Restrictions Partial outer inferior temporal, inferior nasal deficiencies Total superior temporal, inferior temporal, superior nasal, inferior nasal deficiencies       Neuro/Psych    Oriented x3: Yes   Mood/Affect: Normal       Dilation    Both eyes: 1.0% Mydriacyl, 2.5% Phenylephrine @ 2:52 PM        Slit Lamp and Fundus Exam    External Exam      Right Left   External Normal  Normal       Slit Lamp Exam      Right Left   Lens Centered posterior chamber intraocular lens Centered posterior chamber intraocular lens       Fundus Exam      Right Left   Posterior Vitreous  Normal   Disc 3+ Optic disc atrophy, 3+ Pallor    C/D Ratio 0.9 0.55   Macula Macula attached Normal   Vessels Normal Normal   Periphery Chorioretinal scarring 360 from old retinal detachment repairs Normal, no holes or tears peripherally          IMAGING AND PROCEDURES  Imaging and Procedures for 04/25/20  Color Fundus Photography Optos - OU - Both Eyes       Right Eye Progression has been stable. Disc findings include increased cup to disc ratio, pallor. Macula : (Attached,). Vessels : normal observations. Periphery : (Good peripheral chorioretinal scars).   Left Eye Progression has been stable. Disc findings include normal observations. Macula : normal observations. Vessels : normal observations. Periphery : normal observations.                 ASSESSMENT/PLAN:  No problem-specific Assessment & Plan notes found for this encounter.      ICD-10-CM   1. Cystoid macular edema of right eye  H35.351 Color Fundus Photography Optos - OU - Both Eyes  2. Optic atrophy of right eye  H47.20   3. History of retinal detachment  Z86.69   4. Primary open angle glaucoma of left eye, mild stage  H40.1121   5. Pseudophakia, both eyes  Z96.1     1.  2.  3.  Ophthalmic Meds Ordered this visit:  No orders of the defined types were placed in this encounter.      Return in about 9 months (around 01/23/2021) for DILATE OU, OCT.  There are no Patient Instructions on file for this visit.   Explained the diagnoses, plan, and follow up with the patient and they expressed understanding.  Patient expressed understanding of the importance of proper follow up care.   Clent Demark Raven Furnas M.D. Diseases & Surgery of the Retina and Vitreous [facility] [place] 04/25/20     Abbreviations: M myopia (nearsighted); A astigmatism; H hyperopia (farsighted); P  presbyopia; Mrx spectacle prescription;  CTL contact lenses; OD right eye; OS left eye; OU both eyes  XT exotropia; ET esotropia; PEK punctate epithelial keratitis; PEE punctate epithelial erosions; DES dry eye syndrome; MGD meibomian gland dysfunction; ATs artificial tears; PFAT's preservative free artificial tears; [place] nuclear sclerotic cataract; PSC posterior subcapsular cataract; ERM epi-retinal membrane; PVD posterior vitreous detachment; RD retinal detachment; DM diabetes mellitus; DR diabetic retinopathy; NPDR non-proliferative diabetic retinopathy; PDR proliferative diabetic retinopathy; CSME clinically significant macular edema; DME diabetic macular edema; dbh dot blot hemorrhages; CWS cotton wool spot; POAG primary open angle glaucoma; C/D cup-to-disc ratio; HVF humphrey visual field; GVF goldmann visual field; OCT optical coherence tomography; IOP intraocular pressure; BRVO Branch retinal vein occlusion; CRVO central retinal vein occlusion; CRAO central retinal artery occlusion; BRAO branch retinal artery occlusion; RT retinal tear; SB scleral buckle; PPV pars plana vitrectomy; VH Vitreous hemorrhage; PRP panretinal laser photocoagulation; IVK intravitreal kenalog; VMT vitreomacular traction; MH Macular hole;  NVD neovascularization of the disc; NVE neovascularization elsewhere; AREDS age related eye disease study; ARMD age related macular degeneration; POAG primary open angle glaucoma; EBMD epithelial/anterior basement membrane dystrophy; ACIOL anterior chamber intraocular lens; IOL intraocular lens; PCIOL posterior chamber intraocular lens; Phaco/IOL phacoemulsification with intraocular lens placement; [place] photorefractive keratectomy; LASIK laser assisted in situ keratomileusis; HTN hypertension; DM diabetes mellitus; COPD chronic obstructive pulmonary disease

## 2020-07-03 ENCOUNTER — Telehealth: Payer: Self-pay | Admitting: Cardiovascular Disease

## 2020-07-03 ENCOUNTER — Ambulatory Visit: Payer: Medicare Other | Admitting: Physician Assistant

## 2020-07-03 NOTE — Telephone Encounter (Signed)
Called patient's daughter (DPR) back about message. Patient complaining of BLE edema and weakness in left leg. Patient does have SOB with activity, but this is not new. Patient denies any chest pain. Since patient is over due for 6 month follow up, schedule him to see Richardson Dopp PA for evaluation later this afternoon. Encouraged patient's daughter to also call patient's PCP, so they are aware of these new symptoms. Patient's daughter verbalized understanding.

## 2020-07-03 NOTE — Telephone Encounter (Signed)
Pt c/o swelling: STAT is pt has developed SOB within 24 hours  1) How much weight have you gained and in what time span? Not sure. Pt does not weigh himself daily  2) If swelling, where is the swelling located? Feet and ankles   3) Are you currently taking a fluid pill? no  4) Are you currently SOB?   5) Do you have a log of your daily weights (if so, list)? no  6) Have you gained 3 pounds in a day or 5 pounds in a week? Pt does not weigh himself daily  7) Have you traveled recently? No  Daughter of the patient called. The patient has noticed some swelling in his feet and ankles. The patient also states his L leg has felt weaker than normal lately. The daughter wanted to get the patient in to see Dr. Johnsie Cancel but no appointments were available until 10/21

## 2020-07-17 ENCOUNTER — Other Ambulatory Visit: Payer: Self-pay | Admitting: Cardiovascular Disease

## 2020-07-17 DIAGNOSIS — I2510 Atherosclerotic heart disease of native coronary artery without angina pectoris: Secondary | ICD-10-CM

## 2020-08-29 ENCOUNTER — Ambulatory Visit: Payer: Medicare Other | Admitting: Cardiovascular Disease

## 2020-09-04 ENCOUNTER — Other Ambulatory Visit: Payer: Self-pay | Admitting: Cardiovascular Disease

## 2020-09-04 DIAGNOSIS — I2510 Atherosclerotic heart disease of native coronary artery without angina pectoris: Secondary | ICD-10-CM

## 2020-10-04 DIAGNOSIS — Z23 Encounter for immunization: Secondary | ICD-10-CM | POA: Diagnosis not present

## 2020-11-22 ENCOUNTER — Ambulatory Visit: Payer: Medicare Other | Admitting: Cardiovascular Disease

## 2020-11-28 ENCOUNTER — Other Ambulatory Visit: Payer: Self-pay | Admitting: Cardiovascular Disease

## 2020-11-28 DIAGNOSIS — I2510 Atherosclerotic heart disease of native coronary artery without angina pectoris: Secondary | ICD-10-CM

## 2020-12-31 NOTE — Progress Notes (Signed)
Evaluation Performed:  Follow-up visit  Date:  01/02/2021   ID:  [name], DOB 23-Feb-1929, MRN [id number]  Provider Location: Office  PCP:  [name], MD  Cardiologist:  No primary care provider on file. [place] Electrophysiologist:  None   Chief Complaint:  CAD/CABG  History of Present Illness:    HPI: 87 y.o. CAD/ CABG in 1999 last myovue 2018 no ischemia. Seen by PA in July 2019  for "syncope" thought to be vasovagal Holter done 05/24/18 no significant arrhythmia EF 48% on myovue June 2018  His syncope episodes are precipitated by pain in his jaw/teeth when Eating Denies TMJ or chronic dental issues.   No chest pain , postural symptoms dyspnea or palpitations No fever or COVID contacts  Daughter checks in on him weekly and there is a cafe in [place] he can pick up food    No chest pain   Doing well just hard of hearing   Past Medical History:  Diagnosis Date   Basal cell carcinoma 06/02/2003   left bulb nose-CX35FU   CAD    HYPERLIPIDEMIA    HYPERTENSION    Melanoma ([place]) 06/05/2003   left cheek-MOHS   Shortness of breath    Past Surgical History:  Procedure Laterality Date   APPENDECTOMY     CORONARY ARTERY BYPASS GRAFT     KIDNEY STONE SURGERY     REFRACTIVE SURGERY       Current Meds  Medication Sig   atorvastatin (LIPITOR) 20 MG tablet Take 20 mg by mouth daily.    B Complex-C (B-COMPLEX WITH VITAMIN C) tablet Take 1 tablet by mouth daily.   cholecalciferol (VITAMIN D) 1000 UNITS tablet Take 1,000 Units by mouth daily.   clopidogrel (PLAVIX) 75 MG tablet Take 1 tablet (75 mg total) by mouth daily. Please must keep upcoming appt in February 2022 with Dr. Johnsie Cancel before anymore refills. Thank you Final Attempt   denosumab (PROLIA) 60 MG/ML SOLN injection Inject 60 mg into the skin every 6 (six) months. Administer in upper arm, thigh, or abdomen   Esomeprazole Magnesium (NEXIUM PO) Take 1 tablet by mouth every other day.     latanoprost (XALATAN) 0.005 % ophthalmic solution Place 1 drop into the left eye daily.   omega-3 acid ethyl esters (LOVAZA) 1 G capsule Take 1 g by mouth daily.   ramipril (ALTACE) 5 MG capsule Take 5 mg by mouth daily.   vitamin C (ASCORBIC ACID) 500 MG tablet Take 500 mg by mouth daily.     Allergies:   Succinylsulphathiazole, Other, and Sulfonamide derivatives   Social History   Tobacco Use   Smoking status: Former Smoker    Packs/day: 2.00    Years: 40.00    Pack years: 80.00    Types: Cigarettes   Smokeless tobacco: Never Used  Scientific laboratory technician Use: Never used  Substance Use Topics   Alcohol use: No    Alcohol/week: 0.0 standard drinks    Comment: 0.5 glass of wine nightly.   Drug use: No     Family Hx: The patient's family history includes Anuerysm in his father; Coronary artery disease in his father; Healthy in his daughter; Heart attack in his brother.  ROS:   Please see the history of present illness.     All other systems reviewed and are negative.   Prior CV studies:   The following studies were reviewed today:  Holter 05/24/18 Myovue 05/04/17 Echo 11/18/11  Labs/Other Tests and Data  Reviewed:    EKG:  SR LAD PVC nonspecific ST changes 05/24/18 10/30/19 SR rate 85 poor R wave progression  01/02/2021 SR PAC;s LAD poor R wave progression   Recent Labs: No results found for requested labs within last 8760 hours.   Recent Lipid Panel Lab Results  Component Value Date/Time   CHOL 160 05/27/2010 12:00 PM   TRIG 96.0 05/27/2010 12:00 PM   HDL 47.30 05/27/2010 12:00 PM   CHOLHDL 3 05/27/2010 12:00 PM   LDLCALC 94 05/27/2010 12:00 PM    Wt Readings from Last 3 Encounters:  01/02/21 81.6 kg  10/30/19 83.6 kg  02/21/19 83.9 kg     Objective:    Vital Signs:  BP 100/68    Pulse 75    Ht 5\' 6"  (1.676 m)    Wt 81.6 kg    SpO2 98%    BMI 29.05 kg/m    Affect appropriate Elderly kyphotic white male  HEENT: normal Neck supple with no  adenopathy JVP normal no bruits no thyromegaly Lungs clear with no wheezing and good diaphragmatic motion Heart:  S1/S2 no murmur, no rub, gallop or click PMI normal Abdomen: benighn, BS positve, no tenderness, no AAA no bruit.  No HSM or HJR Distal pulses intact with no bruits No edema Neuro non-focal Skin warm and dry No muscular weakness   ASSESSMENT & PLAN:    ASSESSMENT AND PLAN:   1. Syncope: based on history surrounding event, suspect likely vasovagal syncope.  Holter 05/24/18 no significant arrhythmia  2. CAD: h/o CABG in 1999.  Myovue  04/2017 showed normal EF and no ischemia. He denies any CP and no dyspnea. Continue medical therapy.    COVID-19 Education: The signs and symptoms of COVID-19 were discussed with the patient and how to seek care for testing (follow up with PCP or arrange E-visit).  The importance of social distancing was discussed today.   Medication Adjustments/Labs and Tests Ordered: Current medicines are reviewed at length with the patient today.  Concerns regarding medicines are outlined above.  Tests Ordered: No orders of the defined types were placed in this encounter.  Medication Changes: No orders of the defined types were placed in this encounter.   Disposition:  Follow up in 6 months  Signed, [name], MD  01/02/2021 4:26 PM    [facility]

## 2021-01-02 ENCOUNTER — Other Ambulatory Visit: Payer: Self-pay

## 2021-01-02 ENCOUNTER — Ambulatory Visit (INDEPENDENT_AMBULATORY_CARE_PROVIDER_SITE_OTHER): Payer: Medicare Other | Admitting: Cardiovascular Disease

## 2021-01-02 ENCOUNTER — Encounter: Payer: Self-pay | Admitting: Cardiovascular Disease

## 2021-01-02 VITALS — BP 100/68 | HR 75 | Ht 66.0 in | Wt 180.0 lb

## 2021-01-02 DIAGNOSIS — I25810 Atherosclerosis of coronary artery bypass graft(s) without angina pectoris: Secondary | ICD-10-CM

## 2021-01-02 DIAGNOSIS — I2510 Atherosclerotic heart disease of native coronary artery without angina pectoris: Secondary | ICD-10-CM

## 2021-01-02 DIAGNOSIS — I2583 Coronary atherosclerosis due to lipid rich plaque: Secondary | ICD-10-CM

## 2021-01-02 NOTE — Patient Instructions (Signed)

## 2021-01-23 ENCOUNTER — Encounter (INDEPENDENT_AMBULATORY_CARE_PROVIDER_SITE_OTHER): Payer: Medicare Other | Admitting: Ophthalmology

## 2021-01-29 DIAGNOSIS — D692 Other nonthrombocytopenic purpura: Secondary | ICD-10-CM | POA: Diagnosis not present

## 2021-01-29 DIAGNOSIS — M8000XA Age-related osteoporosis with current pathological fracture, unspecified site, initial encounter for fracture: Secondary | ICD-10-CM | POA: Diagnosis not present

## 2021-01-29 DIAGNOSIS — I119 Hypertensive heart disease without heart failure: Secondary | ICD-10-CM | POA: Diagnosis not present

## 2021-01-29 DIAGNOSIS — E782 Mixed hyperlipidemia: Secondary | ICD-10-CM | POA: Diagnosis not present

## 2021-01-29 DIAGNOSIS — I2510 Atherosclerotic heart disease of native coronary artery without angina pectoris: Secondary | ICD-10-CM | POA: Diagnosis not present

## 2021-01-29 DIAGNOSIS — I499 Cardiac arrhythmia, unspecified: Secondary | ICD-10-CM | POA: Diagnosis not present

## 2021-04-08 DIAGNOSIS — R195 Other fecal abnormalities: Secondary | ICD-10-CM | POA: Diagnosis not present

## 2021-05-12 ENCOUNTER — Inpatient Hospital Stay (HOSPITAL_BASED_OUTPATIENT_CLINIC_OR_DEPARTMENT_OTHER)
Admission: EM | Admit: 2021-05-12 | Discharge: 2021-05-17 | DRG: 178 | Disposition: A | Discharge status: Skilled Nursing Facility | Payer: Medicare Other | Attending: Internal Medicine | Admitting: Internal Medicine

## 2021-05-12 ENCOUNTER — Other Ambulatory Visit: Payer: Self-pay

## 2021-05-12 DIAGNOSIS — N179 Acute kidney failure, unspecified: Secondary | ICD-10-CM | POA: Diagnosis not present

## 2021-05-12 DIAGNOSIS — Z951 Presence of aortocoronary bypass graft: Secondary | ICD-10-CM

## 2021-05-12 DIAGNOSIS — D649 Anemia, unspecified: Secondary | ICD-10-CM | POA: Diagnosis present

## 2021-05-12 DIAGNOSIS — R059 Cough, unspecified: Secondary | ICD-10-CM

## 2021-05-12 DIAGNOSIS — U071 COVID-19: Principal | ICD-10-CM | POA: Diagnosis present

## 2021-05-12 DIAGNOSIS — Z8249 Family history of ischemic heart disease and other diseases of the circulatory system: Secondary | ICD-10-CM

## 2021-05-12 DIAGNOSIS — E785 Hyperlipidemia, unspecified: Secondary | ICD-10-CM | POA: Diagnosis present

## 2021-05-12 DIAGNOSIS — I10 Essential (primary) hypertension: Secondary | ICD-10-CM | POA: Diagnosis present

## 2021-05-12 DIAGNOSIS — Z882 Allergy status to sulfonamides status: Secondary | ICD-10-CM

## 2021-05-12 DIAGNOSIS — Z7902 Long term (current) use of antithrombotics/antiplatelets: Secondary | ICD-10-CM

## 2021-05-12 DIAGNOSIS — E8809 Other disorders of plasma-protein metabolism, not elsewhere classified: Secondary | ICD-10-CM | POA: Diagnosis present

## 2021-05-12 DIAGNOSIS — Z87891 Personal history of nicotine dependence: Secondary | ICD-10-CM

## 2021-05-12 DIAGNOSIS — Z85820 Personal history of malignant melanoma of skin: Secondary | ICD-10-CM

## 2021-05-12 DIAGNOSIS — R262 Difficulty in walking, not elsewhere classified: Secondary | ICD-10-CM | POA: Diagnosis present

## 2021-05-12 DIAGNOSIS — E860 Dehydration: Secondary | ICD-10-CM | POA: Diagnosis not present

## 2021-05-12 DIAGNOSIS — D696 Thrombocytopenia, unspecified: Secondary | ICD-10-CM | POA: Diagnosis present

## 2021-05-12 DIAGNOSIS — H9190 Unspecified hearing loss, unspecified ear: Secondary | ICD-10-CM | POA: Diagnosis present

## 2021-05-12 DIAGNOSIS — R9431 Abnormal electrocardiogram [ECG] [EKG]: Secondary | ICD-10-CM | POA: Diagnosis not present

## 2021-05-12 DIAGNOSIS — Z9181 History of falling: Secondary | ICD-10-CM

## 2021-05-12 DIAGNOSIS — R778 Other specified abnormalities of plasma proteins: Secondary | ICD-10-CM

## 2021-05-12 DIAGNOSIS — R2689 Other abnormalities of gait and mobility: Secondary | ICD-10-CM | POA: Diagnosis present

## 2021-05-12 DIAGNOSIS — Z23 Encounter for immunization: Secondary | ICD-10-CM

## 2021-05-12 DIAGNOSIS — Z79899 Other long term (current) drug therapy: Secondary | ICD-10-CM

## 2021-05-12 DIAGNOSIS — I4891 Unspecified atrial fibrillation: Secondary | ICD-10-CM | POA: Diagnosis not present

## 2021-05-12 DIAGNOSIS — W1830XA Fall on same level, unspecified, initial encounter: Secondary | ICD-10-CM | POA: Diagnosis present

## 2021-05-12 DIAGNOSIS — Z6829 Body mass index (BMI) 29.0-29.9, adult: Secondary | ICD-10-CM

## 2021-05-12 DIAGNOSIS — I2510 Atherosclerotic heart disease of native coronary artery without angina pectoris: Secondary | ICD-10-CM | POA: Diagnosis present

## 2021-05-12 DIAGNOSIS — E669 Obesity, unspecified: Secondary | ICD-10-CM | POA: Diagnosis present

## 2021-05-12 DIAGNOSIS — R531 Weakness: Secondary | ICD-10-CM

## 2021-05-12 DIAGNOSIS — R0602 Shortness of breath: Secondary | ICD-10-CM

## 2021-05-12 DIAGNOSIS — R296 Repeated falls: Secondary | ICD-10-CM

## 2021-05-12 DIAGNOSIS — W19XXXA Unspecified fall, initial encounter: Secondary | ICD-10-CM

## 2021-05-12 DIAGNOSIS — Z888 Allergy status to other drugs, medicaments and biological substances status: Secondary | ICD-10-CM

## 2021-05-12 NOTE — ED Triage Notes (Signed)
Per daughter , he fell twice tonight. States his legs gave out and fell on his knees. Also reports he has had loss of appetite this past few days - denies CP / SOB / dizzy   C/O sore on both knees   Pt takes plavix daily - had heart surgery more than 20 yrs ago - denies LOC

## 2021-05-13 ENCOUNTER — Emergency Department (HOSPITAL_BASED_OUTPATIENT_CLINIC_OR_DEPARTMENT_OTHER): Payer: Medicare Other | Admitting: Radiology

## 2021-05-13 ENCOUNTER — Other Ambulatory Visit: Payer: Self-pay

## 2021-05-13 ENCOUNTER — Encounter (HOSPITAL_BASED_OUTPATIENT_CLINIC_OR_DEPARTMENT_OTHER): Payer: Self-pay

## 2021-05-13 DIAGNOSIS — U071 COVID-19: Secondary | ICD-10-CM | POA: Diagnosis present

## 2021-05-13 DIAGNOSIS — I2510 Atherosclerotic heart disease of native coronary artery without angina pectoris: Secondary | ICD-10-CM | POA: Diagnosis not present

## 2021-05-13 DIAGNOSIS — R296 Repeated falls: Secondary | ICD-10-CM | POA: Diagnosis not present

## 2021-05-13 DIAGNOSIS — Z951 Presence of aortocoronary bypass graft: Secondary | ICD-10-CM | POA: Diagnosis not present

## 2021-05-13 DIAGNOSIS — D649 Anemia, unspecified: Secondary | ICD-10-CM | POA: Diagnosis not present

## 2021-05-13 DIAGNOSIS — Z87891 Personal history of nicotine dependence: Secondary | ICD-10-CM | POA: Diagnosis not present

## 2021-05-13 DIAGNOSIS — I2583 Coronary atherosclerosis due to lipid rich plaque: Secondary | ICD-10-CM | POA: Diagnosis not present

## 2021-05-13 DIAGNOSIS — K5900 Constipation, unspecified: Secondary | ICD-10-CM | POA: Diagnosis not present

## 2021-05-13 DIAGNOSIS — D696 Thrombocytopenia, unspecified: Secondary | ICD-10-CM

## 2021-05-13 DIAGNOSIS — Z882 Allergy status to sulfonamides status: Secondary | ICD-10-CM | POA: Diagnosis not present

## 2021-05-13 DIAGNOSIS — K449 Diaphragmatic hernia without obstruction or gangrene: Secondary | ICD-10-CM | POA: Diagnosis not present

## 2021-05-13 DIAGNOSIS — R5381 Other malaise: Secondary | ICD-10-CM | POA: Diagnosis not present

## 2021-05-13 DIAGNOSIS — Z7902 Long term (current) use of antithrombotics/antiplatelets: Secondary | ICD-10-CM | POA: Diagnosis not present

## 2021-05-13 DIAGNOSIS — R262 Difficulty in walking, not elsewhere classified: Secondary | ICD-10-CM | POA: Diagnosis present

## 2021-05-13 DIAGNOSIS — Z9181 History of falling: Secondary | ICD-10-CM | POA: Diagnosis not present

## 2021-05-13 DIAGNOSIS — E8809 Other disorders of plasma-protein metabolism, not elsewhere classified: Secondary | ICD-10-CM | POA: Diagnosis not present

## 2021-05-13 DIAGNOSIS — N179 Acute kidney failure, unspecified: Secondary | ICD-10-CM

## 2021-05-13 DIAGNOSIS — I10 Essential (primary) hypertension: Secondary | ICD-10-CM

## 2021-05-13 DIAGNOSIS — R609 Edema, unspecified: Secondary | ICD-10-CM | POA: Diagnosis not present

## 2021-05-13 DIAGNOSIS — I480 Paroxysmal atrial fibrillation: Secondary | ICD-10-CM | POA: Diagnosis not present

## 2021-05-13 DIAGNOSIS — H9190 Unspecified hearing loss, unspecified ear: Secondary | ICD-10-CM | POA: Diagnosis not present

## 2021-05-13 DIAGNOSIS — R059 Cough, unspecified: Secondary | ICD-10-CM | POA: Diagnosis not present

## 2021-05-13 DIAGNOSIS — R7989 Other specified abnormal findings of blood chemistry: Secondary | ICD-10-CM | POA: Diagnosis not present

## 2021-05-13 DIAGNOSIS — M25562 Pain in left knee: Secondary | ICD-10-CM | POA: Diagnosis not present

## 2021-05-13 DIAGNOSIS — Z79899 Other long term (current) drug therapy: Secondary | ICD-10-CM | POA: Diagnosis not present

## 2021-05-13 DIAGNOSIS — I4819 Other persistent atrial fibrillation: Secondary | ICD-10-CM | POA: Diagnosis not present

## 2021-05-13 DIAGNOSIS — R778 Other specified abnormalities of plasma proteins: Secondary | ICD-10-CM | POA: Diagnosis not present

## 2021-05-13 DIAGNOSIS — Z8249 Family history of ischemic heart disease and other diseases of the circulatory system: Secondary | ICD-10-CM | POA: Diagnosis not present

## 2021-05-13 DIAGNOSIS — M1712 Unilateral primary osteoarthritis, left knee: Secondary | ICD-10-CM | POA: Diagnosis not present

## 2021-05-13 DIAGNOSIS — Z888 Allergy status to other drugs, medicaments and biological substances status: Secondary | ICD-10-CM | POA: Diagnosis not present

## 2021-05-13 DIAGNOSIS — R2689 Other abnormalities of gait and mobility: Secondary | ICD-10-CM | POA: Diagnosis not present

## 2021-05-13 DIAGNOSIS — M25561 Pain in right knee: Secondary | ICD-10-CM | POA: Diagnosis not present

## 2021-05-13 DIAGNOSIS — J811 Chronic pulmonary edema: Secondary | ICD-10-CM | POA: Diagnosis not present

## 2021-05-13 DIAGNOSIS — Z23 Encounter for immunization: Secondary | ICD-10-CM | POA: Diagnosis not present

## 2021-05-13 DIAGNOSIS — E669 Obesity, unspecified: Secondary | ICD-10-CM | POA: Diagnosis not present

## 2021-05-13 DIAGNOSIS — I4891 Unspecified atrial fibrillation: Secondary | ICD-10-CM | POA: Diagnosis not present

## 2021-05-13 DIAGNOSIS — M7989 Other specified soft tissue disorders: Secondary | ICD-10-CM | POA: Diagnosis not present

## 2021-05-13 DIAGNOSIS — W1830XA Fall on same level, unspecified, initial encounter: Secondary | ICD-10-CM | POA: Diagnosis present

## 2021-05-13 DIAGNOSIS — Z043 Encounter for examination and observation following other accident: Secondary | ICD-10-CM | POA: Diagnosis not present

## 2021-05-13 DIAGNOSIS — Z85820 Personal history of malignant melanoma of skin: Secondary | ICD-10-CM | POA: Diagnosis not present

## 2021-05-13 DIAGNOSIS — R0602 Shortness of breath: Secondary | ICD-10-CM | POA: Diagnosis not present

## 2021-05-13 DIAGNOSIS — E860 Dehydration: Secondary | ICD-10-CM | POA: Diagnosis not present

## 2021-05-13 DIAGNOSIS — M47816 Spondylosis without myelopathy or radiculopathy, lumbar region: Secondary | ICD-10-CM | POA: Diagnosis not present

## 2021-05-13 DIAGNOSIS — R531 Weakness: Secondary | ICD-10-CM

## 2021-05-13 DIAGNOSIS — E785 Hyperlipidemia, unspecified: Secondary | ICD-10-CM | POA: Diagnosis not present

## 2021-05-13 LAB — BASIC METABOLIC PANEL
Anion gap: 10 (ref 5–15)
BUN: 29 mg/dL — ABNORMAL HIGH (ref 8–23)
CO2: 21 mmol/L — ABNORMAL LOW (ref 22–32)
Calcium: 8.3 mg/dL — ABNORMAL LOW (ref 8.9–10.3)
Chloride: 103 mmol/L (ref 98–111)
Creatinine, Ser: 1.19 mg/dL (ref 0.61–1.24)
GFR, Estimated: 58 mL/min — ABNORMAL LOW (ref >60)
Glucose, Bld: 149 mg/dL — ABNORMAL HIGH (ref 70–99)
Potassium: 3.9 mmol/L (ref 3.5–5.1)
Sodium: 134 mmol/L — ABNORMAL LOW (ref 135–145)

## 2021-05-13 LAB — LACTATE DEHYDROGENASE: LDH: 146 U/L (ref 98–192)

## 2021-05-13 LAB — RESP PANEL BY RT-PCR (FLU A&B, COVID) ARPGX2
Influenza A by PCR: NEGATIVE
Influenza B by PCR: NEGATIVE
SARS Coronavirus 2 by RT PCR: POSITIVE — AB

## 2021-05-13 LAB — COMPREHENSIVE METABOLIC PANEL
ALT: 15 U/L (ref 0–44)
AST: 29 U/L (ref 15–41)
Albumin: 3.7 g/dL (ref 3.5–5.0)
Alkaline Phosphatase: 35 U/L — ABNORMAL LOW (ref 38–126)
Anion gap: 12 (ref 5–15)
BUN: 38 mg/dL — ABNORMAL HIGH (ref 8–23)
CO2: 21 mmol/L — ABNORMAL LOW (ref 22–32)
Calcium: 9.1 mg/dL (ref 8.9–10.3)
Chloride: 103 mmol/L (ref 98–111)
Creatinine, Ser: 1.58 mg/dL — ABNORMAL HIGH (ref 0.61–1.24)
GFR, Estimated: 42 mL/min — ABNORMAL LOW (ref >60)
Glucose, Bld: 118 mg/dL — ABNORMAL HIGH (ref 70–99)
Potassium: 3.9 mmol/L (ref 3.5–5.1)
Sodium: 136 mmol/L (ref 135–145)
Total Bilirubin: 0.6 mg/dL (ref 0.3–1.2)
Total Protein: 7.2 g/dL (ref 6.5–8.1)

## 2021-05-13 LAB — CBC WITH DIFFERENTIAL/PLATELET
Abs Immature Granulocytes: 0.03 10*3/uL (ref 0.00–0.07)
Basophils Absolute: 0 10*3/uL (ref 0.0–0.1)
Basophils Relative: 1 %
Eosinophils Absolute: 0 10*3/uL (ref 0.0–0.5)
Eosinophils Relative: 0 %
HCT: 30.3 % — ABNORMAL LOW (ref 39.0–52.0)
Hemoglobin: 9.7 g/dL — ABNORMAL LOW (ref 13.0–17.0)
Immature Granulocytes: 0 %
Lymphocytes Relative: 13 %
Lymphs Abs: 1.1 10*3/uL (ref 0.7–4.0)
MCH: 26 pg (ref 26.0–34.0)
MCHC: 32 g/dL (ref 30.0–36.0)
MCV: 81.2 fL (ref 80.0–100.0)
Monocytes Absolute: 1.3 10*3/uL — ABNORMAL HIGH (ref 0.1–1.0)
Monocytes Relative: 16 %
Neutro Abs: 5.6 10*3/uL (ref 1.7–7.7)
Neutrophils Relative %: 70 %
Platelets: 143 10*3/uL — ABNORMAL LOW (ref 150–400)
RBC: 3.73 MIL/uL — ABNORMAL LOW (ref 4.22–5.81)
RDW: 18 % — ABNORMAL HIGH (ref 11.5–15.5)
WBC: 8.1 10*3/uL (ref 4.0–10.5)
nRBC: 0 % (ref 0.0–0.2)

## 2021-05-13 LAB — FERRITIN: Ferritin: 25 ng/mL (ref 24–336)

## 2021-05-13 LAB — C-REACTIVE PROTEIN: CRP: 1.5 mg/dL — ABNORMAL HIGH (ref <1.0)

## 2021-05-13 LAB — D-DIMER, QUANTITATIVE: D-Dimer, Quant: 2.88 ug/mL-FEU — ABNORMAL HIGH (ref 0.00–0.50)

## 2021-05-13 LAB — FIBRINOGEN: Fibrinogen: 378 mg/dL (ref 210–475)

## 2021-05-13 LAB — HEPATITIS B SURFACE ANTIGEN: Hepatitis B Surface Ag: NONREACTIVE

## 2021-05-13 LAB — HEMOGLOBIN AND HEMATOCRIT, BLOOD
HCT: 33.1 % — ABNORMAL LOW (ref 39.0–52.0)
Hemoglobin: 10.4 g/dL — ABNORMAL LOW (ref 13.0–17.0)

## 2021-05-13 LAB — TROPONIN I (HIGH SENSITIVITY)
Troponin I (High Sensitivity): 94 ng/L — ABNORMAL HIGH (ref <18)
Troponin I (High Sensitivity): 136 ng/L (ref <18)

## 2021-05-13 LAB — TYPE AND SCREEN
ABO/RH(D): A POS
Antibody Screen: NEGATIVE

## 2021-05-13 LAB — BRAIN NATRIURETIC PEPTIDE: B Natriuretic Peptide: 154.5 pg/mL — ABNORMAL HIGH (ref 0.0–100.0)

## 2021-05-13 LAB — PROCALCITONIN: Procalcitonin: <0.1 ng/mL

## 2021-05-13 LAB — ABO/RH: ABO/RH(D): A POS

## 2021-05-13 LAB — CK: Total CK: 667 U/L — ABNORMAL HIGH (ref 49–397)

## 2021-05-13 MED ORDER — DENOSUMAB 60 MG/ML ~~LOC~~ SOLN: 60.0000 mg | SUBCUTANEOUS | Status: DC

## 2021-05-13 MED ORDER — SODIUM CHLORIDE 0.9% FLUSH
3.0000 mL | Freq: Two times a day (BID) | INTRAVENOUS | Status: DC
  Administered 2021-05-13 – 2021-05-16 (×6): 3 mL via INTRAVENOUS

## 2021-05-13 MED ORDER — PANTOPRAZOLE SODIUM 40 MG PO TBEC
40.0000 mg | DELAYED_RELEASE_TABLET | ORAL | Status: DC
  Administered 2021-05-14 – 2021-05-16 (×2): 40 mg via ORAL
  Filled 2021-05-13 (×2): qty 1

## 2021-05-13 MED ORDER — LATANOPROST 0.005 % OP SOLN
1.0000 [drp] | Freq: Every day | OPHTHALMIC | Status: DC
  Administered 2021-05-13 – 2021-05-16 (×4): 1 [drp] via OPHTHALMIC
  Filled 2021-05-13: qty 2.5

## 2021-05-13 MED ORDER — CLOPIDOGREL BISULFATE 75 MG PO TABS: 75.0000 mg | ORAL_TABLET | Freq: Every day | ORAL | Status: DC

## 2021-05-13 MED ORDER — ONDANSETRON HCL 4 MG/2ML IJ SOLN: 4.0000 mg | Freq: Four times a day (QID) | INTRAMUSCULAR | Status: DC | PRN

## 2021-05-13 MED ORDER — DICLOFENAC SODIUM 1 % EX GEL
2.0000 g | Freq: Two times a day (BID) | CUTANEOUS | Status: DC
  Administered 2021-05-13 – 2021-05-17 (×6): 2 g via TOPICAL
  Filled 2021-05-13: qty 100

## 2021-05-13 MED ORDER — METHYLPREDNISOLONE SODIUM SUCC 125 MG IJ SOLR
125.0000 mg | Freq: Once | INTRAMUSCULAR | Status: AC
  Administered 2021-05-13: 125 mg via INTRAVENOUS
  Filled 2021-05-13: qty 2

## 2021-05-13 MED ORDER — GUAIFENESIN-DM 100-10 MG/5ML PO SYRP: 10.0000 mL | ORAL_SOLUTION | ORAL | Status: DC | PRN

## 2021-05-13 MED ORDER — HYDROCOD POLST-CPM POLST ER 10-8 MG/5ML PO SUER: 5.0000 mL | Freq: Two times a day (BID) | ORAL | Status: DC | PRN

## 2021-05-13 MED ORDER — SODIUM CHLORIDE 0.9 % IV SOLN: Freq: Once | INTRAVENOUS | Status: AC

## 2021-05-13 MED ORDER — ASCORBIC ACID 500 MG PO TABS
500.0000 mg | ORAL_TABLET | Freq: Every day | ORAL | Status: DC
  Administered 2021-05-14 – 2021-05-17 (×4): 500 mg via ORAL
  Filled 2021-05-13 (×4): qty 1

## 2021-05-13 MED ORDER — SODIUM CHLORIDE 0.9 % IV SOLN
100.0000 mg | Freq: Every day | INTRAVENOUS | Status: DC
  Administered 2021-05-14 – 2021-05-15 (×2): 100 mg via INTRAVENOUS
  Filled 2021-05-13: qty 100
  Filled 2021-05-13 (×2): qty 20

## 2021-05-13 MED ORDER — CLOPIDOGREL BISULFATE 75 MG PO TABS
75.0000 mg | ORAL_TABLET | Freq: Every day | ORAL | Status: DC
  Administered 2021-05-14: 75 mg via ORAL
  Filled 2021-05-13: qty 1

## 2021-05-13 MED ORDER — ZINC SULFATE 220 (50 ZN) MG PO CAPS
220.0000 mg | ORAL_CAPSULE | Freq: Every day | ORAL | Status: DC
  Administered 2021-05-14 – 2021-05-17 (×4): 220 mg via ORAL
  Filled 2021-05-13 (×4): qty 1

## 2021-05-13 MED ORDER — ALBUTEROL SULFATE HFA 108 (90 BASE) MCG/ACT IN AERS
2.0000 | INHALATION_SPRAY | Freq: Four times a day (QID) | RESPIRATORY_TRACT | Status: DC | PRN
  Filled 2021-05-13: qty 6.7

## 2021-05-13 MED ORDER — ONDANSETRON HCL 4 MG PO TABS: 4.0000 mg | ORAL_TABLET | Freq: Four times a day (QID) | ORAL | Status: DC | PRN

## 2021-05-13 MED ORDER — SODIUM CHLORIDE 0.9 % IV SOLN
100.0000 mg | INTRAVENOUS | Status: AC
  Administered 2021-05-13 (×2): 100 mg via INTRAVENOUS

## 2021-05-13 MED ORDER — ATORVASTATIN CALCIUM 10 MG PO TABS
20.0000 mg | ORAL_TABLET | Freq: Every day | ORAL | Status: DC
  Administered 2021-05-13 – 2021-05-16 (×4): 20 mg via ORAL
  Filled 2021-05-13 (×4): qty 2

## 2021-05-13 NOTE — ED Provider Notes (Signed)
[facility] Provider Note  CSN: [id number] Arrival date & time: 05/12/21 2342  Chief Complaint(s) Fall  HPI [name] is a 87 y.o. male with a history of multifactorial gait disorder who presents to the emergency department after a fall at home.  He reports that his bilateral lower extremities gave out on him while walking causing him to fall onto his knees.  Patient was able to get up and crawled into his home but had difficulty getting up.  He called family who called neighbors to help him get up.  Patient denied any associated chest pain or shortness of breath during this episode.  No focal deficits.  No headache or lightheadedness.  He is endorsing mild bilateral knee right hip pain.  No neck pain or back pain.  No lower extremity weakness or loss of sensation.  No bladder/bowel incontinence.  He does report that he has had decreased appetite and generalized fatigue over the past several days.  He reports he is also been dealing with chest congestion for the same period of time.  He denies any fevers or chills.  No nausea or vomiting.  No bloody bowel movements.  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 Authorizing Provider  atorvastatin (LIPITOR) 20 MG tablet Take 20 mg by mouth daily.  10/26/11   [provider]  B Complex-C (B-COMPLEX WITH VITAMIN C) tablet Take 1 tablet by mouth daily.    [provider]  cholecalciferol (VITAMIN D) 1000 UNITS tablet Take 1,000 Units by mouth daily.    [provider]  clopidogrel (PLAVIX) 75 MG tablet Take 1 tablet (75 mg total) by mouth daily. Please must keep upcoming appt in February 2022 with Dr. Johnsie Cancel before anymore refills. Thank you Final Attempt 11/29/20   Josue Hector, MD  denosumab (PROLIA) 60 MG/ML SOLN injection Inject 60 mg into the skin every 6 (six) months. Administer in upper arm, thigh, or abdomen    [provider]  Esomeprazole Magnesium (NEXIUM PO) Take 1 tablet by mouth every other day.     [provider]  latanoprost (XALATAN) 0.005 % ophthalmic solution Place 1 drop into the left eye daily. 09/26/18   [provider]  omega-3 acid ethyl esters (LOVAZA) 1 G capsule Take 1 g by mouth daily.    [provider]  ramipril (ALTACE) 5 MG capsule Take 5 mg by mouth daily.    [provider]  vitamin C (ASCORBIC ACID) 500 MG tablet Take 500 mg by mouth daily.    [provider]  Past Surgical History Past Surgical History:  Procedure Laterality Date   APPENDECTOMY     CORONARY ARTERY BYPASS GRAFT     KIDNEY STONE SURGERY     REFRACTIVE SURGERY     Family History Family History  Problem Relation Age of Onset   Coronary artery disease Father    Anuerysm Father    Heart attack Brother    Healthy Daughter     Social History Social History   Tobacco Use   Smoking status: Former    Packs/day: 2.00    Years: 40.00    Pack years: 80.00    Types: Cigarettes    Smokeless tobacco: Never  Vaping Use   Vaping Use: Never used  Substance Use Topics   Alcohol use: No    Alcohol/week: 0.0 standard drinks    Comment: 0.5 glass of wine nightly.   Drug use: No   Allergies Succinylsulphathiazole, Other, and Sulfonamide derivatives  Review of Systems Review of Systems All other systems are reviewed and are negative for acute change except as noted in the HPI  Physical Exam Vital Signs  I have reviewed the triage vital signs BP 112/84 (BP Location: Right Arm)   Pulse 78   Temp 98.6 F (37 C) (Oral)   Resp 18   Ht 5\' 5"  (1.651 m)   Wt 81.6 kg   SpO2 100%   BMI 29.95 kg/m   Physical Exam Vitals reviewed.  Constitutional:      General: He is not in acute distress.    Appearance: He is well-developed. He is not diaphoretic.  HENT:     Head: Normocephalic and atraumatic.     Nose: Nose normal.  Eyes:     General: No scleral icterus.       Right eye: No discharge.        Left eye: No discharge.     Conjunctiva/sclera: Conjunctivae normal.     Pupils: Pupils are equal, round, and reactive to light.  Cardiovascular:     Rate and Rhythm: Normal rate and regular rhythm.     Heart sounds: No murmur heard.   No friction rub. No gallop.  Pulmonary:     Effort: Pulmonary effort is normal. No respiratory distress.     Breath sounds: Normal breath sounds. No stridor. No rales.  Abdominal:     General: There is no distension.     Palpations: Abdomen is soft.     Tenderness: There is no abdominal tenderness.  Musculoskeletal:     Cervical back: Normal range of motion and neck supple.     Right upper leg: Tenderness (Mild quad tenderness) present. No swelling, deformity or bony tenderness.     Left upper leg: No swelling, deformity or bony tenderness.       Legs:  Skin:    General: Skin is warm and dry.     Findings: No erythema or rash.  Neurological:     Mental Status: He is alert and oriented to person, place, and time.    ED  Results and Treatments Labs (all labs ordered are listed, but only abnormal results are displayed) Labs Reviewed  RESP PANEL BY RT-PCR (FLU A&B, COVID) ARPGX2 - Abnormal; Notable for the following components:      Result Value   SARS Coronavirus 2 by RT PCR POSITIVE (*)    All other components within normal limits  CBC WITH DIFFERENTIAL/PLATELET - Abnormal; Notable for the following components:   RBC 3.73 (*)  Hemoglobin 9.7 (*)    HCT 30.3 (*)    RDW 18.0 (*)    Platelets 143 (*)    Monocytes Absolute 1.3 (*)    All other components within normal limits  COMPREHENSIVE METABOLIC PANEL - Abnormal; Notable for the following components:   CO2 21 (*)    Glucose, Bld 118 (*)    BUN 38 (*)    Creatinine, Ser 1.58 (*)    Alkaline Phosphatase 35 (*)    GFR, Estimated 42 (*)    All other components within normal limits  BRAIN NATRIURETIC PEPTIDE - Abnormal; Notable for the following components:   B Natriuretic Peptide 154.5 (*)    All other components within normal limits  TROPONIN I (HIGH SENSITIVITY) - Abnormal; Notable for the following components:   Troponin I (High Sensitivity) 94 (*)    All other components within normal limits  TROPONIN I (HIGH SENSITIVITY) - Abnormal; Notable for the following components:   Troponin I (High Sensitivity) 136 (*)    All other components within normal limits                                                                                                                         EKG  EKG Interpretation  Date/Time:  Tuesday May 13 2021 00:31:58 EDT Ventricular Rate:  100 PR Interval:  160 QRS Duration: 119 QT Interval:  373 QTC Calculation: 482 R Axis:   -64 Text Interpretation: Sinus tachycardia with irregular rate Left anterior fascicular block Probable left ventricular hypertrophy Confirmed by Addison Lank [phone]) on 05/13/2021 12:54:43 AM        Radiology DG Chest 2 View  Result Date: 05/13/2021 CLINICAL DATA:  Fall EXAM: CHEST - 2  VIEW COMPARISON:  07/26/2009 FINDINGS: Lungs are clear.  No pleural effusion or pneumothorax. The heart is normal in size. Postsurgical changes related to prior CABG. IMPRESSION: No evidence of acute cardiopulmonary disease. Electronically Signed   By: Julian Hy M.D.   On: 05/13/2021 01:09   DG Knee Complete 4 Views Left  Result Date: 05/13/2021 CLINICAL DATA:  Fall EXAM: LEFT KNEE - COMPLETE 4+ VIEW COMPARISON:  None. FINDINGS: No fracture or dislocation is seen. Mild degenerative changes with sharpening of the tibial spines, patellofemoral joint space narrowing with small osteophytes, and lateral compartment chondrocalcinosis. Visualized soft tissues are within normal limits. No suprapatellar knee joint effusion. IMPRESSION: No fracture or dislocation is seen. Mild degenerative changes, as above. Electronically Signed   By: Julian Hy M.D.   On: 05/13/2021 01:10   DG Knee Complete 4 Views Right  Result Date: 05/13/2021 CLINICAL DATA:  Fall EXAM: RIGHT KNEE - COMPLETE 4+ VIEW COMPARISON:  None. FINDINGS: No fracture or dislocation is seen. Mild lateral compartment chondrocalcinosis. Joint spaces are essentially preserved. Mild prepatellar soft tissue swelling. IMPRESSION: Mild prepatellar soft tissue swelling. Electronically Signed   By: Julian Hy M.D.   On: 05/13/2021 01:11   DG HIP UNILAT W OR W/O PELVIS  2-3 VIEWS RIGHT  Result Date: 05/13/2021 CLINICAL DATA:  Fall EXAM: DG HIP (WITH OR WITHOUT PELVIS) 2-3V RIGHT COMPARISON:  None. FINDINGS: No fracture or dislocation is seen. Bilateral hip joint spaces are preserved. Visualized bony pelvis appears intact. Degenerative changes of the lower lumbar spine with prior vertebral augmentation. Vascular calcifications. IMPRESSION: Negative. Electronically Signed   By: Julian Hy M.D.   On: 05/13/2021 01:10    Pertinent labs & imaging results that were available during my care of the patient were reviewed by me and considered in my  medical decision making (see chart for details).  Medications Ordered in ED Medications  methylPREDNISolone sodium succinate (SOLU-MEDROL) 125 mg/2 mL injection 125 mg (has no administration in time range)  0.9 %  sodium chloride infusion ( Intravenous New Bag/Given 05/13/21 0033)                                                                                                                                    Procedures .1-3 Lead EKG Interpretation  Date/Time: 05/13/2021 3:47 AM Performed by: Fatima Blank, MD Authorized by: Fatima Blank, MD     Interpretation: normal     ECG rate:  84   ECG rate assessment: normal     Rhythm: sinus rhythm     Ectopy: PAC     Conduction: normal   .Critical Care  Date/Time: 05/13/2021 3:47 AM Performed by: Fatima Blank, MD Authorized by: Fatima Blank, MD   Critical care provider statement:    Critical care time (minutes):  45   Critical care was necessary to treat or prevent imminent or life-threatening deterioration of the following conditions: elevated troponin.   Critical care was time spent personally by me on the following activities:  Discussions with consultants, evaluation of patient's response to treatment, examination of patient, ordering and performing treatments and interventions, ordering and review of laboratory studies, ordering and review of radiographic studies, pulse oximetry, re-evaluation of patient's condition, obtaining history from patient or surrogate and review of old charts  (including critical care time)  Medical Decision Making / ED Course I have reviewed the nursing notes for this encounter and the patient's prior records (if available in EHR or on provided paperwork).   LEONELL LOBDELL was evaluated in Emergency Department on 05/13/2021 for the symptoms described in the history of present illness. He was evaluated in the context of the global COVID-19 pandemic, which necessitated  consideration that the patient might be at risk for infection with the SARS-CoV-2 virus that causes COVID-19. Institutional protocols and algorithms that pertain to the evaluation of patients at risk for COVID-19 are in a state of rapid change based on information released by regulatory bodies including the CDC and federal and state organizations. These policies and algorithms were followed during the patient's care in the ED.  Patient presents with generalized fatigue decreased appetite for several days resulting in lower  extremity weakness and fall. He denied any associated chest pain or shortness of breath.  He has been having respiratory symptoms including chest congestion for the past several days as well.  No known sick contacts. Patient is afebrile with stable vital signs. EKG without acute ischemic changes. Patient does not have evidence of volume overload on exam.  Work-up is notable for  positive COVID AKI Patient's hemoglobin is down approximately 3 g from 2 years ago patient denies any bloody bowel movements. Uptrending troponin.  Patient does have significant cardiac history including 5 vessel bypass 20 years ago.  Likely demand ischemia from COVID-19 infection. Chest x-ray without evidence of pneumonia.  Plain film of patient's bilateral knees and hip negative for any acute fracture or dislocation. Patient given steroids and started on remdesivir. Will require admission for further management.     Final Clinical Impression(s) / ED Diagnoses Final diagnoses:  Fall  Cough  COVID-19 virus infection  Elevated troponin  AKI (acute kidney injury) ([place])      This chart was dictated using voice recognition software.  Despite best efforts to proofread,  errors can occur which can change the documentation meaning.    Fatima Blank, MD 05/13/21 [phone]

## 2021-05-13 NOTE — ED Notes (Signed)
Butch Penny (daughter) [phone]  For updates

## 2021-05-13 NOTE — H&P (Addendum)
History and Physical    [name] WGN:[id number] DOB: [birth year] DOA: 05/12/2021  Referring MD/NP/PA: Francia Greaves, DO PCP: [name], MD  Patient coming from:home via EMS [facility] transfer  Chief Complaint: Fall  I have personally briefly reviewed patient's old medical records in [place]   HPI: [name] is a 87 y.o. male with medical history significant of hypertension, hyperlipidemia, CAD s/p CABG, and gait disturbance(walks with a walker) who presents after having 2 falls yesterday. He reports that both of his knees gave out causing him to fall.  The first fall happened while they were at a restaurant and the second time once he got home and was trying to get into his recliner.  With each fall he denies any loss of consciousness or trauma to his head.  He is able to be helped up while he was at the restaurant, but had to crawl and call for help the second fall.  He lives alone at baseline and therefore his daughter had to call neighbors over to come and try and help him up.  Over the last 2 -3 days he reports that he has had a intermittently productive cough, congestion, decreased appetite, generalized malaise, and loose stools.  Denies having any significant fever, vomiting, dysuria, or blood in stools that he is aware of.  He received both of his vaccines and a booster.  Daughter reports that he does not walk well at baseline and she has been trying to get him some help at home because she lives an hour away.  He had just recently had lab work done with his primary care provider last month.  His daughter states that she will try and get them to fax over the records tomorrow morning.   ED Course: Upon admission to the emergency department patient was seen to be afebrile, pulse 59-78, respirations 18-28, blood pressures 109/58-150/58, and O2 saturations as low as 90-97% on RA.  Labs significant for hemoglobin 9.7, platelets 143, BUN 38, creatinine 1.58, BNP 154.5, HS troponin  94-> 136.  Chest x-ray was negative for any acute abnormality. COVID-19 screening was positive.  X-rays of both knees noted no acute fracture, but there was noted of some prepatellar swelling on the right knee.  Patient had been given 1 L normal saline IV fluids at 125 mL/h, Solu-Medrol 125 mg IV and started on remdesivir.  Patient had been accepted to a progressive bed as inpatient prior to transport.  Review of Systems  Constitutional:  Positive for malaise/fatigue. Negative for fever.  HENT:  Positive for congestion. Negative for ear discharge.   Eyes:  Negative for photophobia and pain.  Respiratory:  Positive for cough and sputum production. Negative for shortness of breath.   Cardiovascular:  Negative for chest pain and leg swelling.  Gastrointestinal:  Positive for diarrhea. Negative for abdominal pain, nausea and vomiting.  Genitourinary:  Negative for dysuria and hematuria.  Musculoskeletal:  Positive for falls and joint pain.  Skin:  Negative for rash.  Neurological:  Positive for weakness. Negative for loss of consciousness.  Psychiatric/Behavioral:  Negative for substance abuse. The patient does not have insomnia.    Past Medical History:  Diagnosis Date   Basal cell carcinoma 06/02/2003   left bulb nose-[id number]   CAD    HYPERLIPIDEMIA    HYPERTENSION    Melanoma ([place]) 06/05/2003   left cheek-MOHS   Shortness of breath     Past Surgical History:  Procedure Laterality Date   APPENDECTOMY  CORONARY ARTERY BYPASS GRAFT     KIDNEY STONE SURGERY     REFRACTIVE SURGERY       reports that he has quit smoking. His smoking use included cigarettes. He has a 80.00 pack-year smoking history. He has never used smokeless tobacco. He reports that he does not drink alcohol and does not use drugs.  Allergies  Allergen Reactions   Succinylsulphathiazole Rash   Other Other (See Comments)   Sulfonamide Derivatives Itching and Rash    Family History  Problem Relation Age of  Onset   Coronary artery disease Father    Anuerysm Father    Heart attack Brother    Healthy Daughter     Prior to Admission medications   Medication Sig Start Date End Date Taking? Authorizing Provider  atorvastatin (LIPITOR) 20 MG tablet Take 20 mg by mouth daily.  10/26/11  Yes [provider]  B Complex-C (B-COMPLEX WITH VITAMIN C) tablet Take 1 tablet by mouth daily.   Yes [provider]  clopidogrel (PLAVIX) 75 MG tablet Take 1 tablet (75 mg total) by mouth daily. Please must keep upcoming appt in February 2022 with Dr. Johnsie Cancel before anymore refills. Thank you Final Attempt 11/29/20  Yes Josue Hector, MD  denosumab (PROLIA) 60 MG/ML SOLN injection Inject 60 mg into the skin every 6 (six) months. Administer in upper arm, thigh, or abdomen   Yes [provider]  Esomeprazole Magnesium (NEXIUM PO) Take 1 tablet by mouth every other day.    Yes [provider]  latanoprost (XALATAN) 0.005 % ophthalmic solution Place 1 drop into the left eye daily. 09/26/18  Yes [provider]  ramipril (ALTACE) 5 MG capsule Take 5 mg by mouth daily.   Yes [provider]  vitamin C (ASCORBIC ACID) 500 MG tablet Take 500 mg by mouth daily.   Yes [provider]    Physical Exam:  Constitutional: Elderly male who appears to be in no acute distress at this time Vitals:   05/13/21 1131 05/13/21 1200 05/13/21 1230 05/13/21 1522  BP:  129/77 135/69 (!) 150/58  Pulse:  (!) 59 65 62  Resp:  (!) 28 (!) 24 17  Temp: 98 F (36.7 C)   97.6 F (36.4 C)  TempSrc: Oral   Oral  SpO2:  93% 96% 96%  Weight:      Height:    5\' 5"  (1.651 m)   Eyes: PERRL, lids and conjunctivae normal ENMT: Mucous membranes are moist. Posterior pharynx clear of any exudate or lesions.  Neck: normal, supple, no masses, no thyromegaly Respiratory: clear to auscultation bilaterally, no wheezing, no crackles. Normal respiratory effort. No accessory muscle use.   Cardiovascular: Regular rate and rhythm, no murmurs / rubs / gallops. No extremity edema. 2+ pedal pulses. No carotid bruits.  Abdomen: no tenderness, no masses palpated. No hepatosplenomegaly. Bowel sounds positive.  Musculoskeletal: no clubbing / cyanosis.  Crepitation noted of the left knee and mild swelling of the right knee.. Skin: no rashes, lesions, ulcers. No induration Neurologic: CN 2-12 grossly intact. Sensation intact, DTR normal. Strength 5/5 in all 4.  Psychiatric: Normal judgment and insight. Alert and oriented x 3. Normal mood.     Labs on Admission: I have personally reviewed following labs and imaging studies  CBC: Recent Labs  Lab 05/13/21 0020  WBC 8.1  NEUTROABS 5.6  HGB 9.7*  HCT 30.3*  MCV 81.2  PLT 161*   Basic Metabolic Panel: Recent Labs  Lab 05/13/21  0020  NA 136  K 3.9  CL 103  CO2 21*  GLUCOSE 118*  BUN 38*  CREATININE 1.58*  CALCIUM 9.1   GFR: Estimated Creatinine Clearance: 31.2 mL/min (A) (by C-G formula based on SCr of 1.58 mg/dL (H)). Liver Function Tests: Recent Labs  Lab 05/13/21 0020  AST 29  ALT 15  ALKPHOS 35*  BILITOT 0.6  PROT 7.2  ALBUMIN 3.7   No results for input(s): LIPASE, AMYLASE in the last 168 hours. No results for input(s): AMMONIA in the last 168 hours. Coagulation Profile: No results for input(s): INR, PROTIME in the last 168 hours. Cardiac Enzymes: No results for input(s): CKTOTAL, CKMB, CKMBINDEX, TROPONINI in the last 168 hours. BNP (last 3 results) No results for input(s): PROBNP in the last 8760 hours. HbA1C: No results for input(s): HGBA1C in the last 72 hours. CBG: No results for input(s): GLUCAP in the last 168 hours. Lipid Profile: No results for input(s): CHOL, HDL, LDLCALC, TRIG, CHOLHDL, LDLDIRECT in the last 72 hours. Thyroid Function Tests: No results for input(s): TSH, T4TOTAL, FREET4, T3FREE, THYROIDAB in the last 72 hours. Anemia Panel: No results for input(s): VITAMINB12, FOLATE,  FERRITIN, TIBC, IRON, RETICCTPCT in the last 72 hours. Urine analysis: No results found for: COLORURINE, APPEARANCEUR, LABSPEC, PHURINE, GLUCOSEU, HGBUR, BILIRUBINUR, KETONESUR, PROTEINUR, UROBILINOGEN, NITRITE, LEUKOCYTESUR Sepsis Labs: Recent Results (from the past 240 hour(s))  Resp Panel by RT-PCR (Flu A&B, Covid) Nasopharyngeal Swab     Status: Abnormal   Collection Time: 05/13/21  1:32 AM   Specimen: Nasopharyngeal Swab; Nasopharyngeal(NP) swabs in vial transport medium  Result Value Ref Range Status   SARS Coronavirus 2 by RT PCR POSITIVE (A) NEGATIVE Final    Comment: RESULT CALLED TO, READ BACK BY AND VERIFIED WITH: POWELL A, RN 05/13/21 @ 0327 BY AV (NOTE) SARS-CoV-2 target nucleic acids are DETECTED.  The SARS-CoV-2 RNA is generally detectable in upper respiratory specimens during the acute phase of infection. Positive results are indicative of the presence of the identified virus, but do not rule out bacterial infection or co-infection with other pathogens not detected by the test. Clinical correlation with patient history and other diagnostic information is necessary to determine patient infection status. The expected result is Negative.  Fact Sheet for Patients: EntrepreneurPulse.com.au  Fact Sheet for Healthcare Providers: IncredibleEmployment.be  This test is not yet approved or cleared by the [place] FDA and  has been authorized for detection and/or diagnosis of SARS-CoV-2 by FDA under an Emergency Use Authorization (EUA).  This EUA will remain in effect (meaning this test can be  used) for the duration of  the COVID-19 declaration under Section 564(b)(1) of the Act, 21 U.S.C. section 360bbb-3(b)(1), unless the authorization is terminated or revoked sooner.     Influenza A by PCR NEGATIVE NEGATIVE Final   Influenza B by PCR NEGATIVE NEGATIVE Final    Comment: (NOTE) The Xpert Xpress SARS-CoV-2/FLU/RSV plus assay is  intended as an aid in the diagnosis of influenza from Nasopharyngeal swab specimens and should not be used as a sole basis for treatment. Nasal washings and aspirates are unacceptable for Xpert Xpress SARS-CoV-2/FLU/RSV testing.  Fact Sheet for Patients: EntrepreneurPulse.com.au  Fact Sheet for Healthcare Providers: IncredibleEmployment.be  This test is not yet approved or cleared by the [place] FDA and has been authorized for detection and/or diagnosis of SARS-CoV-2 by FDA under an Emergency Use Authorization (EUA). This EUA will remain in effect (meaning this test can be used) for the duration of the  COVID-19 declaration under Section 564(b)(1) of the Act, 21 U.S.C. section 360bbb-3(b)(1), unless the authorization is terminated or revoked.  Performed at [facility], [address] [place], [place] [address]      Radiological Exams on Admission: DG Chest 2 View  Result Date: 05/13/2021 CLINICAL DATA:  Fall EXAM: CHEST - 2 VIEW COMPARISON:  07/26/2009 FINDINGS: Lungs are clear.  No pleural effusion or pneumothorax. The heart is normal in size. Postsurgical changes related to prior CABG. IMPRESSION: No evidence of acute cardiopulmonary disease. Electronically Signed   By: Julian Hy M.D.   On: 05/13/2021 01:09   DG Knee Complete 4 Views Left  Result Date: 05/13/2021 CLINICAL DATA:  Fall EXAM: LEFT KNEE - COMPLETE 4+ VIEW COMPARISON:  None. FINDINGS: No fracture or dislocation is seen. Mild degenerative changes with sharpening of the tibial spines, patellofemoral joint space narrowing with small osteophytes, and lateral compartment chondrocalcinosis. Visualized soft tissues are within normal limits. No suprapatellar knee joint effusion. IMPRESSION: No fracture or dislocation is seen. Mild degenerative changes, as above. Electronically Signed   By: Julian Hy M.D.   On: 05/13/2021 01:10   DG Knee Complete 4  Views Right  Result Date: 05/13/2021 CLINICAL DATA:  Fall EXAM: RIGHT KNEE - COMPLETE 4+ VIEW COMPARISON:  None. FINDINGS: No fracture or dislocation is seen. Mild lateral compartment chondrocalcinosis. Joint spaces are essentially preserved. Mild prepatellar soft tissue swelling. IMPRESSION: Mild prepatellar soft tissue swelling. Electronically Signed   By: Julian Hy M.D.   On: 05/13/2021 01:11   DG HIP UNILAT W OR W/O PELVIS 2-3 VIEWS RIGHT  Result Date: 05/13/2021 CLINICAL DATA:  Fall EXAM: DG HIP (WITH OR WITHOUT PELVIS) 2-3V RIGHT COMPARISON:  None. FINDINGS: No fracture or dislocation is seen. Bilateral hip joint spaces are preserved. Visualized bony pelvis appears intact. Degenerative changes of the lower lumbar spine with prior vertebral augmentation. Vascular calcifications. IMPRESSION: Negative. Electronically Signed   By: Julian Hy M.D.   On: 05/13/2021 01:10    EKG: Independently reviewed.  Sinus tachycardia 100 bpm with left anterior fascicular block  Assessment/Plan Weakness secondary to COVID-19 infection: Acute.  Patient presents with complaints of weakness after having 2 falls.  Found to be positive for COVID.  Patient is vaccinated and have received at least 1 booster.  Chest x-ray was otherwise clear and patient was noted to be maintaining O2 saturations on room air.  O2 saturations had dropped as low as 90% and patient had been given Solu-Medrol 125 mg IV x1 dose and started on remdesivir. -Admit to progressive bed -COVID-19 order set utilized -Continue to monitor O2 saturations and utilize nasal cannula oxygen as needed needed to maintain O2 saturation greater than 92% -Continue remdesivir IV  -Albuterol inhaler as needed for shortness of breath/wheeze -Antitussives as needed  Acute kidney injury: Acute.  Patient presented with creatinine elevated to 1.58 with BUN 38.  Last creatinine on record from 2019 noted to be around 1.  Patient had just recently had lab  work done for which daughter states she will try and get their office to fax over records in a.m. he had received 1 L normal saline IV fluids. -Encourage oral fluids -CK level -Hold nephrotoxic agent -Continue to monitor kidney function  Elevated troponin coronary artery disease: Acute.  Patient has a prior history of CABG back in 2019.  Denies any complaints of chest pain at this time.  High-sensitivity troponins were elevated 94->136.  Records note patient had a intermittent stress test  back in 04/2017 that noted EF of 48% with global hypokinesis, but no significant reversible ischemia seen.  He is followed by Dr. Johnsie Cancel of cardiology. -Check echocardiogram -Continue Plavix(if hemoglobin remains stable) and statin -Consider need of formal cardiology consult if needed  Falls gait disturbance: Prior to arrival.  Patient presents after having 2 falls.  X-rays of the pelvis and bilateral knees showed no acute fractures.  Daughter reports at baseline patient has some difficulty ambulating and uses a walker. -PT/OT consulted to eval and treat -Transitions of care consulted for possible need of home health and/or rehab  Normocytic anemia: Acute.  Hemoglobin 9.7 g/dL on admission, but had previously been normal in the past with last hemoglobin noted to be 13.4 g/dL in 2019. -Type and screen for possible need of blood product -Check stool guaiac -Continue to monitor H&H  Essential hypertension: Blood pressures currently maintained.  Home blood pressure medications include ramipril 5 mg daily. -Hold ramipril due to AKI  Thrombocytopenia: Acute.  Platelet count 143 on admission. -Continue to monitor  Hyperlipidemia -Continue statin  DVT prophylaxis: SCDs Code Status: Full Family Communication: Daughter updated over the phone Disposition Plan: Hopefully discharge home once medically Consults called: None Admission status: Inpatient, require more than 2 midnight stay  Norval Morton  MD [facility]   If 7PM-7AM, please contact night-coverage   05/13/2021, 4:50 PM

## 2021-05-14 ENCOUNTER — Inpatient Hospital Stay (HOSPITAL_COMMUNITY): Payer: Medicare Other

## 2021-05-14 DIAGNOSIS — I2510 Atherosclerotic heart disease of native coronary artery without angina pectoris: Secondary | ICD-10-CM

## 2021-05-14 DIAGNOSIS — U071 COVID-19: Principal | ICD-10-CM

## 2021-05-14 DIAGNOSIS — R778 Other specified abnormalities of plasma proteins: Secondary | ICD-10-CM

## 2021-05-14 DIAGNOSIS — I480 Paroxysmal atrial fibrillation: Secondary | ICD-10-CM

## 2021-05-14 LAB — CBC WITH DIFFERENTIAL/PLATELET
Abs Immature Granulocytes: 0.03 10*3/uL (ref 0.00–0.07)
Basophils Absolute: 0 10*3/uL (ref 0.0–0.1)
Basophils Relative: 0 %
Eosinophils Absolute: 0 10*3/uL (ref 0.0–0.5)
Eosinophils Relative: 0 %
HCT: 30 % — ABNORMAL LOW (ref 39.0–52.0)
Hemoglobin: 9.4 g/dL — ABNORMAL LOW (ref 13.0–17.0)
Immature Granulocytes: 0 %
Lymphocytes Relative: 17 %
Lymphs Abs: 1.3 10*3/uL (ref 0.7–4.0)
MCH: 25.8 pg — ABNORMAL LOW (ref 26.0–34.0)
MCHC: 31.3 g/dL (ref 30.0–36.0)
MCV: 82.2 fL (ref 80.0–100.0)
Monocytes Absolute: 0.8 10*3/uL (ref 0.1–1.0)
Monocytes Relative: 10 %
Neutro Abs: 5.6 10*3/uL (ref 1.7–7.7)
Neutrophils Relative %: 73 %
Platelets: 151 10*3/uL (ref 150–400)
RBC: 3.65 MIL/uL — ABNORMAL LOW (ref 4.22–5.81)
RDW: 18 % — ABNORMAL HIGH (ref 11.5–15.5)
WBC: 7.8 10*3/uL (ref 4.0–10.5)
nRBC: 0 % (ref 0.0–0.2)

## 2021-05-14 LAB — COMPREHENSIVE METABOLIC PANEL
ALT: 19 U/L (ref 0–44)
AST: 49 U/L — ABNORMAL HIGH (ref 15–41)
Albumin: 2.9 g/dL — ABNORMAL LOW (ref 3.5–5.0)
Alkaline Phosphatase: 32 U/L — ABNORMAL LOW (ref 38–126)
Anion gap: 6 (ref 5–15)
BUN: 37 mg/dL — ABNORMAL HIGH (ref 8–23)
CO2: 22 mmol/L (ref 22–32)
Calcium: 8.3 mg/dL — ABNORMAL LOW (ref 8.9–10.3)
Chloride: 109 mmol/L (ref 98–111)
Creatinine, Ser: 1.26 mg/dL — ABNORMAL HIGH (ref 0.61–1.24)
GFR, Estimated: 55 mL/min — ABNORMAL LOW (ref >60)
Glucose, Bld: 117 mg/dL — ABNORMAL HIGH (ref 70–99)
Potassium: 4.2 mmol/L (ref 3.5–5.1)
Sodium: 137 mmol/L (ref 135–145)
Total Bilirubin: 0.6 mg/dL (ref 0.3–1.2)
Total Protein: 6.4 g/dL — ABNORMAL LOW (ref 6.5–8.1)

## 2021-05-14 LAB — FERRITIN: Ferritin: 26 ng/mL (ref 24–336)

## 2021-05-14 LAB — MAGNESIUM: Magnesium: 1.9 mg/dL (ref 1.7–2.4)

## 2021-05-14 LAB — PHOSPHORUS: Phosphorus: 3.2 mg/dL (ref 2.5–4.6)

## 2021-05-14 LAB — BRAIN NATRIURETIC PEPTIDE: B Natriuretic Peptide: 404.2 pg/mL — ABNORMAL HIGH (ref 0.0–100.0)

## 2021-05-14 LAB — TSH: TSH: 0.877 u[IU]/mL (ref 0.350–4.500)

## 2021-05-14 LAB — C-REACTIVE PROTEIN: CRP: 1.3 mg/dL — ABNORMAL HIGH (ref <1.0)

## 2021-05-14 LAB — D-DIMER, QUANTITATIVE: D-Dimer, Quant: 2.75 ug/mL-FEU — ABNORMAL HIGH (ref 0.00–0.50)

## 2021-05-14 MED ORDER — PNEUMOCOCCAL VAC POLYVALENT 25 MCG/0.5ML IJ INJ
0.5000 mL | INJECTION | INTRAMUSCULAR | Status: DC
  Filled 2021-05-14: qty 0.5

## 2021-05-14 MED ORDER — APIXABAN 2.5 MG PO TABS
2.5000 mg | ORAL_TABLET | Freq: Two times a day (BID) | ORAL | Status: DC
  Administered 2021-05-14 (×2): 2.5 mg via ORAL
  Filled 2021-05-14 (×2): qty 1

## 2021-05-14 MED ORDER — DEXAMETHASONE SODIUM PHOSPHATE 10 MG/ML IJ SOLN
6.0000 mg | INTRAMUSCULAR | Status: DC
  Administered 2021-05-14 – 2021-05-15 (×2): 6 mg via INTRAVENOUS
  Filled 2021-05-14 (×2): qty 1

## 2021-05-14 MED ORDER — APIXABAN 5 MG PO TABS: 5.0000 mg | ORAL_TABLET | Freq: Two times a day (BID) | ORAL | Status: DC

## 2021-05-14 MED ORDER — SODIUM CHLORIDE 0.9 % IV SOLN: INTRAVENOUS | Status: DC

## 2021-05-14 MED ORDER — ENOXAPARIN SODIUM 40 MG/0.4ML IJ SOSY
40.0000 mg | PREFILLED_SYRINGE | Freq: Every day | INTRAMUSCULAR | Status: DC
  Administered 2021-05-14: 40 mg via SUBCUTANEOUS
  Filled 2021-05-14: qty 0.4

## 2021-05-14 MED ORDER — DILTIAZEM HCL 25 MG/5ML IV SOLN
10.0000 mg | Freq: Four times a day (QID) | INTRAVENOUS | Status: DC | PRN
  Filled 2021-05-14: qty 5

## 2021-05-14 MED ORDER — METOPROLOL TARTRATE 50 MG PO TABS
50.0000 mg | ORAL_TABLET | Freq: Two times a day (BID) | ORAL | Status: DC
  Administered 2021-05-14 – 2021-05-16 (×6): 50 mg via ORAL
  Filled 2021-05-14 (×6): qty 1

## 2021-05-14 MED ORDER — DILTIAZEM HCL 25 MG/5ML IV SOLN
10.0000 mg | INTRAVENOUS | Status: AC
  Administered 2021-05-14: 10 mg via INTRAVENOUS
  Filled 2021-05-14: qty 5

## 2021-05-14 NOTE — Evaluation (Signed)
Physical Therapy Evaluation Patient Details Name: [name] MRN: [id number] DOB: 22-Mar-1929 Today's Date: 05/14/2021   History of Present Illness  87yo male admitted 05/12/21 c/o recent fall. Found to be covid positive. PMH HLD, HTN, CAD, CABG  Clinical Impression   Patient received in bed, extremely HOH which limited PT's ability to effectively communicate with him. Interestingly, has a hard time hearing conversational volumes but seems to have an even harder time when spoken to in a loud, deep voice- family reports this has been his recent baseline. Question possible cognitive processing delay? Has very poor awareness of safety and deficits, tells me he is not falling backwards while actively losing balance posteriorly. Needed heavy levels of physical assist for low level tasks such as side stepping and transfers today. Will need +2 to progress gait. Left in bed with all needs met, bed alarm active. Definitely in need of skilled rehab f/u in SNF setting at DC.     Follow Up Recommendations SNF;Supervision/Assistance - 24 hour    Equipment Recommendations  Rolling walker with 5" wheels;3in1 (PT);Wheelchair (measurements PT);Wheelchair cushion (measurements PT)    Recommendations for Other Services       Precautions / Restrictions Precautions Precautions: Fall;Other (comment) Precaution Comments: EXTREMELY HOH, Covid + Restrictions Weight Bearing Restrictions: No      Mobility  Bed Mobility Overal bed mobility: Needs Assistance Bed Mobility: Supine to Sit;Sit to Supine     Supine to sit: HOB elevated;Mod assist Sit to supine: HOB elevated;Min assist   General bed mobility comments: assist given to help with midline, upright balance as he tends to lose balance posteriorly quickly at EOB with little awareness    Transfers Overall transfer level: Needs assistance Equipment used: Rolling walker (2 wheeled) Transfers: Sit to/from Stand Sit to Stand: Min assist          General transfer comment: MinA to boost to upright standing however very unsteady and impulsive- tried to start walking before he fully had his balance and before PT was ready  Ambulation/Gait         Gait velocity: decreased   General Gait Details: deferred- will need +2 for safety. Able to take 2-3 side steps alongside EOB with ModA for balance, heavy posterior lean  Stairs            Wheelchair Mobility    Modified Rankin (Stroke Patients Only)       Balance Overall balance assessment: History of Falls;Needs assistance Sitting-balance support: Bilateral upper extremity supported;Feet supported Sitting balance-Leahy Scale: Poor Sitting balance - Comments: posterior LOB with limited awareness/ability to correct Postural control: Posterior lean Standing balance support: During functional activity;Bilateral upper extremity supported Standing balance-Leahy Scale: Poor Standing balance comment: heavy posterior lean with little awareness/ability to correct                             Pertinent Vitals/Pain Pain Assessment: Faces Faces Pain Scale: No hurt Pain Intervention(s): Limited activity within patient's tolerance;Monitored during session    [place] expects to be discharged to:: Private residence Living Arrangements: Alone Available Help at Discharge: Family;Available PRN/intermittently (family lives 60 minutes away) Type of Home: House Home Access: Stairs to enter Entrance Stairs-Rails: Right;Left;Can reach both Entrance Stairs-Number of Steps: 3 with B rails Home Layout: One level Home Equipment: Walker - 2 wheels;Shower seat - built in;Grab bars - toilet;Grab bars - tub/shower Additional Comments: per family, very sedentary at baseline; can walk 4-5  steps without RW but furniture walks. Due to mobility issues, family's goal is for him to go to rehab then transition to ALF    Prior Function Level of Independence: Independent with  assistive device(s)         Comments: EXTREMELY HOH, and seems have to have a harder time understanding with louder volumes. Might benefit from white board/marker board     Hand Dominance        Extremity/Trunk Assessment   Upper Extremity Assessment Upper Extremity Assessment: Defer to OT evaluation    Lower Extremity Assessment Lower Extremity Assessment: Generalized weakness    Cervical / Trunk Assessment Cervical / Trunk Assessment: Kyphotic  Communication   Communication: HOH;Other (comment) (family thinks there is a processing impairment on top of hearing loss)  Cognition Arousal/Alertness: Awake/alert Behavior During Therapy: WFL for tasks assessed/performed;Impulsive Overall Cognitive Status: No family/caregiver present to determine baseline cognitive functioning                                 General Comments: impulsive and very poor awareness of deficits- tells me he is not falling backwards as he was literally falling backwards sitting at EOB      General Comments General comments (skin integrity, edema, etc.): VSS on RA    Exercises     Assessment/Plan    PT Assessment Patient needs continued PT services  PT Problem List Decreased strength;Decreased cognition;Decreased knowledge of use of DME;Decreased activity tolerance;Decreased safety awareness;Decreased balance;Decreased knowledge of precautions;Decreased mobility;Decreased coordination       PT Treatment Interventions DME instruction;Balance training;Gait training;Stair training;Cognitive remediation;Functional mobility training;Patient/family education;Therapeutic activities;Therapeutic exercise    PT Goals (Current goals can be found in the Care Plan section)  Acute Rehab PT Goals Patient Stated Goal: no more falls, go to rehab then ALF PT Goal Formulation: With family Time For Goal Achievement: 05/28/21 Potential to Achieve Goals: Good    Frequency Min 2X/week   Barriers  to discharge        Co-evaluation               AM-PAC PT "6 Clicks" Mobility  Outcome Measure Help needed turning from your back to your side while in a flat bed without using bedrails?: A Little Help needed moving from lying on your back to sitting on the side of a flat bed without using bedrails?: A Lot Help needed moving to and from a bed to a chair (including a wheelchair)?: A Lot Help needed standing up from a chair using your arms (e.g., wheelchair or bedside chair)?: A Little Help needed to walk in hospital room?: Total Help needed climbing 3-5 steps with a railing? : Total 6 Click Score: 12    End of Session Equipment Utilized During Treatment: Gait belt Activity Tolerance: Patient tolerated treatment well Patient left: in bed;with call bell/phone within reach;with bed alarm set Nurse Communication: Mobility status PT Visit Diagnosis: Unsteadiness on feet (R26.81);Repeated falls (R29.6);Muscle weakness (generalized) (M62.81);Difficulty in walking, not elsewhere classified (R26.2)    Time: 6803-2122 PT Time Calculation (min) (ACUTE ONLY): 17 min   Charges:   PT Evaluation $PT Eval Moderate Complexity: 1 Mod         Windell Norfolk, DPT, PN1   Supplemental Physical Therapist [place]    Pager [phone] Acute Rehab Office [phone]

## 2021-05-14 NOTE — Progress Notes (Signed)
  Echocardiogram 2D Echocardiogram has been performed.  Lakisha Peyser  Lynnette Caffey 05/14/2021, 2:43 PM

## 2021-05-14 NOTE — Progress Notes (Addendum)
PROGRESS NOTE                                                                                                                                                                                                             Patient Demographics:    [name], is a 87 y.o. male, DOB - 1929/04/01, OEU:[id number]  Outpatient Primary MD for the patient is [name], MD    LOS - 1  Admit date - 05/12/2021    Chief Complaint  Patient presents with   Fall       Brief Narrative (HPI from H&P) - HUSAYN Fuentes is a 87 y.o. male with medical history significant of hypertension, hyperlipidemia, CAD s/p CABG, and gait disturbance (walks with a walker) who presents after having 2 falls yesterday, per wife 3-4 falls in the last 1 yr, has had 3 shots of COVID-vaccine so far, in the ER he was diagnosed with incidental COVID-19 infection, dehydration and AKI, admitted for further care.   Subjective:    [name] today has, No headache, No chest pain, No abdominal pain - No Nausea, No new weakness tingling or numbness, no SOB.   Assessment  & Plan :     Acute Covid 19 Viral  Infection  - he has received 3 shots of COVID-19 vaccine, he has COVID-related systemic symptoms causing dehydration and AKI, CRP is mildly elevated and D-dimer is moderately elevated, he is being treated with IV fluids for dehydration and AKI, low-dose IV steroids and 3 doses of remdesivir.  Will check lower extremity venous duplex due to elevated D-dimer.  Encouraged the patient to sit up in chair in the daytime use I-S and flutter valve for pulmonary toiletry.  Will advance activity and titrate down oxygen as possible.  SpO2: 96 %  Recent Labs  Lab 05/13/21 0020 05/13/21 0132 05/13/21 1733 05/14/21 0111  WBC 8.1  --   --  7.8  HGB 9.7*  --  10.4* 9.4*  HCT 30.3*  --  33.1* 30.0*  PLT 143*  --   --  151  CRP  --   --  1.5* 1.3*  BNP 154.5*  --    --   --   DDIMER  --   --  2.88* 2.75*  PROCALCITON  --   --  <0.10  --  AST 29  --   --  49*  ALT 15  --   --  19  ALKPHOS 35*  --   --  32*  BILITOT 0.6  --   --  0.6  ALBUMIN 3.7  --   --  2.9*  SARSCOV2NAA  --  POSITIVE*  --   --     2.  Dehydration with AKI.  Hydrate with IV fluids and avoid nephrotoxins.  3.  New onset A. fib.  Currently in RVR, [place] vas 2 score of greater than 3.  He has had 3-4 falls over the last 1 year but still has decent quality of life, discussed risks and benefits with wife and patient, they would like to consider anticoagulation and avoid ischemic stroke if possible.  For now we will get a EKG, check TSH and echo, Cardizem IV push if required will place on Cardizem drip and eventually possible beta-blocker orally.  Will defer anticoagulation to patient's primary cardiology group.  4.  CAD s/p CABG in 2019.  No acute issues follows with Dr. Johnsie Cancel, echo pending, on Plavix which will be continued, eventually oral beta-blocker if possible  5.  Balance with 3-4 falls over the last 1 year.  PT OT, may require SNF.  6.  Mild thrombocytopenia could be due to viral illness.  Improving with supportive care.  7.  Dyslipidemia.  On statin.  8.  Essential hypertension.  Blood pressure was soft hence ACE inhibitor being held, monitor may require beta-blocker for A. fib.    Obesity: Estimated body mass index is 29.95 kg/m as calculated from the following:   Height as of this encounter: 5\' 5"  (1.651 m).   Weight as of this encounter: 81.6 kg.          Condition - Fair  Family Communication  :  wife on 05/14/21  Code Status :  Full  Consults  :  Cards  PUD Prophylaxis : PPI   Procedures  :     TTE      Disposition Plan  :    Status is: Inpatient  Remains inpatient appropriate because:IV treatments appropriate due to intensity of illness or inability to take PO  Dispo: The patient is from: Home              Anticipated d/c is to: Home               Patient currently is not medically stable to d/c.   Difficult to place patient Yes  DVT Prophylaxis  :    enoxaparin (LOVENOX) injection 40 mg Start: 05/14/21 1000 SCDs Start: 05/13/21 1701  Lab Results  Component Value Date   PLT 151 05/14/2021    Diet :  Diet Order             Diet Heart Room service appropriate? Yes; Fluid consistency: Thin  Diet effective now                    Inpatient Medications  Scheduled Meds:   vitamin C  500 mg Oral Daily   atorvastatin  20 mg Oral q1800   clopidogrel  75 mg Oral Daily   dexamethasone (DECADRON) injection  6 mg Intravenous Q24H   diclofenac Sodium  2 g Topical BID   enoxaparin (LOVENOX) injection  40 mg Subcutaneous Daily   latanoprost  1 drop Left Eye QHS   pantoprazole  40 mg Oral QODAY   sodium chloride flush  3 mL  Intravenous Q12H   zinc sulfate  220 mg Oral Daily   Continuous Infusions:  sodium chloride 50 mL/hr at 05/14/21 3903   remdesivir 100 mg in NS 100 mL 100 mg (05/14/21 0903)   PRN Meds:.albuterol, chlorpheniramine-HYDROcodone, diltiazem, guaiFENesin-dextromethorphan, [DISCONTINUED] ondansetron **OR** ondansetron (ZOFRAN) IV  Antibiotics  :    Anti-infectives (From admission, onward)    Start     Dose/Rate Route Frequency Ordered Stop   05/14/21 1000  remdesivir 100 mg in sodium chloride 0.9 % 100 mL IVPB       See Hyperspace for full Linked Orders Report.   100 mg 200 mL/hr over 30 Minutes Intravenous Daily 05/13/21 0402 05/18/21 0959   05/13/21 0430  remdesivir 100 mg in sodium chloride 0.9 % 100 mL IVPB       See Hyperspace for full Linked Orders Report.   100 mg 200 mL/hr over 30 Minutes Intravenous Every 30 min 05/13/21 0402 05/13/21 0526        Time Spent in minutes  30   Lala Lund M.D on 05/14/2021 at 10:24 AM  To page go to [email or web address]   [facility] -  Office  [phone]  See all Orders from today for further details    Objective:   Vitals:   05/13/21  1935 05/13/21 2313 05/14/21 0400 05/14/21 0819  BP: (!) 121/94 123/70 132/76 123/81  Pulse: 62 60 63 94  Resp: 19 (!) 21 (!) 22 20  Temp: 97.6 F (36.4 C) 97.6 F (36.4 C) 97.8 F (36.6 C) 98.1 F (36.7 C)  TempSrc: Oral Oral Oral Oral  SpO2: 97% 97% 94% 96%  Weight:      Height:        Wt Readings from Last 3 Encounters:  05/13/21 81.6 kg  01/02/21 81.6 kg  10/30/19 83.6 kg     Intake/Output Summary (Last 24 hours) at 05/14/2021 1024 Last data filed at 05/14/2021 0950 Gross per 24 hour  Intake 360 ml  Output 800 ml  Net -440 ml     Physical Exam  Awake Alert, No new F.N deficits, Normal affect [place].AT,PERRAL Supple Neck,No JVD, No cervical lymphadenopathy appriciated.  Symmetrical Chest wall movement, Good air movement bilaterally, CTAB iRRR,No Gallops,Rubs or new Murmurs, No Parasternal Heave +ve B.Sounds, Abd Soft, No tenderness, No organomegaly appriciated, No rebound - guarding or rigidity. No Cyanosis, Clubbing or edema, No new Rash or bruise      Data Review:    CBC Recent Labs  Lab 05/13/21 0020 05/13/21 1733 05/14/21 0111  WBC 8.1  --  7.8  HGB 9.7* 10.4* 9.4*  HCT 30.3* 33.1* 30.0*  PLT 143*  --  151  MCV 81.2  --  82.2  MCH 26.0  --  25.8*  MCHC 32.0  --  31.3  RDW 18.0*  --  18.0*  LYMPHSABS 1.1  --  1.3  MONOABS 1.3*  --  0.8  EOSABS 0.0  --  0.0  BASOSABS 0.0  --  0.0    Recent Labs  Lab 05/13/21 0020 05/13/21 1733 05/14/21 0111  NA 136 134* 137  K 3.9 3.9 4.2  CL 103 103 109  CO2 21* 21* 22  GLUCOSE 118* 149* 117*  BUN 38* 29* 37*  CREATININE 1.58* 1.19 1.26*  CALCIUM 9.1 8.3* 8.3*  AST 29  --  49*  ALT 15  --  19  ALKPHOS 35*  --  32*  BILITOT 0.6  --  0.6  ALBUMIN 3.7  --  2.9*  MG  --   --  1.9  CRP  --  1.5* 1.3*  DDIMER  --  2.88* 2.75*  PROCALCITON  --  <0.10  --   BNP 154.5*  --   --    No results found for: TSH   Radiology Reports DG Chest 2 View  Result Date: 05/13/2021 CLINICAL DATA:  Fall EXAM: CHEST - 2  VIEW COMPARISON:  07/26/2009 FINDINGS: Lungs are clear.  No pleural effusion or pneumothorax. The heart is normal in size. Postsurgical changes related to prior CABG. IMPRESSION: No evidence of acute cardiopulmonary disease. Electronically Signed   By: Julian Hy M.D.   On: 05/13/2021 01:09   DG Chest Port 1 View  Result Date: 05/14/2021 CLINICAL DATA:  Shortness of breath, COVID positive by report. History of heart surgery. EXAM: PORTABLE CHEST 1 VIEW COMPARISON:  May 13, 2021. FINDINGS: EKG leads project over the chest. Post median sternotomy for CABG. Signs of LIMA grafting. Cardiomediastinal contours and hilar structures are stable. Trachea is in the midline accounting for mild rotation and mild buckling due to low lung volumes. Signs of aortic atherosclerosis. Hiatal hernia projects posterior to the heart as on previous imaging. No sign of lobar consolidative changes. Vascular crowding without frank edema mild asymmetry of interstitial markings on the LEFT as compared to the RIGHT in the upper chest. No sign of effusion. No pneumothorax. On limited assessment no acute skeletal process. Signs of prior trauma to LEFT-sided ribs. Signs of cement augmentation lumbar and thoracic spine not well evaluated. IMPRESSION: 1. Mild pulmonary vascular congestion and or vascular crowding. Question of mild increased interstitial markings on the LEFT could reflect mild pneumonitis or early asymmetric pulmonary edema. 2. Hiatal hernia. 3. Post CABG. 4. Signs of aortic atherosclerosis. Aortic Atherosclerosis (ICD10-I70.0). Electronically Signed   By: Zetta Bills M.D.   On: 05/14/2021 08:33   DG Knee Complete 4 Views Left  Result Date: 05/13/2021 CLINICAL DATA:  Fall EXAM: LEFT KNEE - COMPLETE 4+ VIEW COMPARISON:  None. FINDINGS: No fracture or dislocation is seen. Mild degenerative changes with sharpening of the tibial spines, patellofemoral joint space narrowing with small osteophytes, and lateral compartment  chondrocalcinosis. Visualized soft tissues are within normal limits. No suprapatellar knee joint effusion. IMPRESSION: No fracture or dislocation is seen. Mild degenerative changes, as above. Electronically Signed   By: Julian Hy M.D.   On: 05/13/2021 01:10   DG Knee Complete 4 Views Right  Result Date: 05/13/2021 CLINICAL DATA:  Fall EXAM: RIGHT KNEE - COMPLETE 4+ VIEW COMPARISON:  None. FINDINGS: No fracture or dislocation is seen. Mild lateral compartment chondrocalcinosis. Joint spaces are essentially preserved. Mild prepatellar soft tissue swelling. IMPRESSION: Mild prepatellar soft tissue swelling. Electronically Signed   By: Julian Hy M.D.   On: 05/13/2021 01:11   DG HIP UNILAT W OR W/O PELVIS 2-3 VIEWS RIGHT  Result Date: 05/13/2021 CLINICAL DATA:  Fall EXAM: DG HIP (WITH OR WITHOUT PELVIS) 2-3V RIGHT COMPARISON:  None. FINDINGS: No fracture or dislocation is seen. Bilateral hip joint spaces are preserved. Visualized bony pelvis appears intact. Degenerative changes of the lower lumbar spine with prior vertebral augmentation. Vascular calcifications. IMPRESSION: Negative. Electronically Signed   By: Julian Hy M.D.   On: 05/13/2021 01:10

## 2021-05-14 NOTE — Progress Notes (Signed)
OT Cancellation Note  Patient Details Name: OLVIN ROHR MRN: [id number] DOB: 05-06-1929   Cancelled Treatment:    Reason Eval/Treat Not Completed: Medical issues which prohibited therapy. Awaiting results of LE doppler for DVT r/o in context of elevated D-dimer/acute Covid.  Tyrone Schimke, OT Acute Rehabilitation Services Pager: [phone] Office: [phone]  05/14/2021, 1:18 PM

## 2021-05-14 NOTE — Consult Note (Addendum)
Cardiology Consultation:   Patient ID: [name] MRN: [id number]; DOB: 02-Sep-1929  Admit date: 05/12/2021 Date of Consult: 05/14/2021  PCP:  Charles Neer, MD   [facility] Providers Cardiologist:  Charles Rouge, MD   {  Patient Profile:   [name] is a 87 y.o. male with a history of CAD s/p remote CABG in 1999, hypertension, hyperlipidemia, basal cell carcinoma, multifactorial gait disorder, and remote smoking history (quit in the 1980s) who is being seen today for the evaluation of atrial fibrillation in the setting of COVID-19 at the request of [name].  History of Present Illness:   [name] is a 87 year old male with the above history who is followed by [name]. Patient has a history of CAD with remote CABG in 1999. Last ischemic evaluation was a Myoview in 04/2017 which was considered intermediate risk due to reduced EF of 48%; however, no evidence of ischemia. He was seen in 05/2018 for evaluation of syncope which was felt to be vasovagal in nature. Holter monitor was ordered for further evaluation and showed underlying sinus rhythm with short runs of atrial tachycardia and NSVT as well as some PACs/PVCs. No concerning arrhythmias to explain syncope. Patient was last seen by [name] in 12/2020 at which time he was doing well.  Patient presented to the ED on 05/12/2021 for further evaluation after a mechanical fall at home. Patient has underlying problem with gait and ambulates with a walker. On day of presentation, he fell twice. The first episode occurred in a "cafe." People there helped him to his feet and he drove home and then when walking in his home he fell again. Patient denies any prodromal symptoms and states both time his legs just gave out on him and he fell to his knees. No chest pain, shortness of breath, palpitations, lightheadedness, dizziness prior to the falls. No LOC. He did not hit his head during the falls. Patient states he has had 3-4 falls  recently. He states he well occasional have brief episodes of palpitations where it feels like his heart is racing or skipping a beat but none prior to falls. He also reports feeling more weak recently and having some postnasal drainage the last couple of days but denies any cough to me. It sounds like he generally does not have a very big appetite. No fevers or nausea or vomiting. No abnormal bleeding in urine or stools. He notes occasional very brief episodes of pain on the right side of this head that only last a few seconds. No vision changes.  In the ED, vital stable. EKG showed sinus tachycardia vs MAT, rate 100 bpm. High-sensitivity troponin elevated at 94 >> 136. BNP 154. Chest x-ray showed no acute findings. D-dimer elevated at 2.88 WBC 8.1, Hgb 9.7, Plts 143. Na 136, K 3.9, Glucose 118, BUN 38, Cr 1.58 (baseline around 0.8 to 1.0). Albumin 3.7, AST 29, ALT 15, Alk Phos 35, Total Bili 0.6. Respiratory panel positive for COVID-19. Patient was admitted for further management of COVID-19 and was started on IV Remdesivir. Patient developed new onset atrial fibrillation this morning shortly before 7:00AM. Therefore, Cardiology consulted for further evaluation.  At the time of this evaluation, patient resting comfortably in no acute distress. He is still in atrial fibrillation with rates in the 70s to 90s but is asymptomatic with this. Patient is very hard of hearing. No family at bedside. Per H&P, admitted provider spoke with daughter. Patient lives a long and does  not walk well a baseline. Daughter has been trying to get him some help a home because she lives 1 hour away.   Past Medical History:  Diagnosis Date   Basal cell carcinoma 06/02/2003   left bulb nose-[id number]   CAD    HYPERLIPIDEMIA    HYPERTENSION    Melanoma ([place]) 06/05/2003   left cheek-MOHS   Shortness of breath     Past Surgical History:  Procedure Laterality Date   APPENDECTOMY     CORONARY ARTERY BYPASS GRAFT     KIDNEY  STONE SURGERY     REFRACTIVE SURGERY       Home Medications:  Prior to Admission medications   Medication Sig Start Date End Date Taking? Authorizing Provider  atorvastatin (LIPITOR) 20 MG tablet Take 20 mg by mouth daily.  10/26/11  Yes [provider]  B Complex-C (B-COMPLEX WITH VITAMIN C) tablet Take 1 tablet by mouth daily.   Yes [provider]  clopidogrel (PLAVIX) 75 MG tablet Take 1 tablet (75 mg total) by mouth daily. Please must keep upcoming appt in February 2022 with [name] before anymore refills. Thank you Final Attempt 11/29/20  Yes Josue Hector, MD  denosumab (PROLIA) 60 MG/ML SOLN injection Inject 60 mg into the skin every 6 (six) months. Administer in upper arm, thigh, or abdomen   Yes [provider]  Esomeprazole Magnesium (NEXIUM PO) Take 1 tablet by mouth every other day.    Yes [provider]  latanoprost (XALATAN) 0.005 % ophthalmic solution Place 1 drop into the left eye daily. 09/26/18  Yes [provider]  ramipril (ALTACE) 5 MG capsule Take 5 mg by mouth daily.   Yes [provider]  vitamin C (ASCORBIC ACID) 500 MG tablet Take 500 mg by mouth daily.   Yes [provider]    Inpatient Medications: Scheduled Meds:  vitamin C  500 mg Oral Daily   atorvastatin  20 mg Oral q1800   clopidogrel  75 mg Oral Daily   dexamethasone (DECADRON) injection  6 mg Intravenous Q24H   diclofenac Sodium  2 g Topical BID   enoxaparin (LOVENOX) injection  40 mg Subcutaneous Daily   latanoprost  1 drop Left Eye QHS   metoprolol tartrate  50 mg Oral BID   pantoprazole  40 mg Oral QODAY   pneumococcal 23 valent vaccine  0.5 mL Intramuscular Tomorrow-1000   sodium chloride flush  3 mL Intravenous Q12H   zinc sulfate  220 mg Oral Daily   Continuous Infusions:  sodium chloride 50 mL/hr at 05/14/21 6195   remdesivir 100 mg in NS 100 mL 100 mg (05/14/21 0903)   PRN Meds: albuterol, chlorpheniramine-HYDROcodone,  diltiazem, guaiFENesin-dextromethorphan, [DISCONTINUED] ondansetron **OR** ondansetron (ZOFRAN) IV  Allergies:    Allergies  Allergen Reactions   Succinylsulphathiazole Rash   Other Other (See Comments)   Sulfonamide Derivatives Itching and Rash    Social History:   Social History   Socioeconomic History   Marital status: Widowed    Spouse name: Not on file   Number of children: Not on file   Years of education: Not on file   Highest education level: Not on file  Occupational History   Not on file  Tobacco Use   Smoking status: Former    Packs/day: 2.00    Years: 40.00    Pack years: 80.00    Types: Cigarettes   Smokeless tobacco: Never  Vaping Use   Vaping Use: Never used  Substance  and Sexual Activity   Alcohol use: No    Alcohol/week: 0.0 standard drinks    Comment: 0.5 glass of wine nightly.   Drug use: No   Sexual activity: Not on file  Other Topics Concern   Not on file  Social History Narrative   Lives alone in a one story home.  Has one daughter.     Retired Administrator.    Education: 9th grade   Social Determinants of Radio broadcast assistant Strain: Not on file  Food Insecurity: Not on file  Transportation Needs: Not on file  Physical Activity: Not on file  Stress: Not on file  Social Connections: Not on file  Intimate Partner Violence: Not on file    Family History:    Family History  Problem Relation Age of Onset   Coronary artery disease Father    Anuerysm Father    Heart attack Brother    Healthy Daughter      ROS:  Please see the history of present illness.  Review of Systems  Constitutional:  Negative for fever.  HENT:  Congestion: postnasal drainage.   Eyes:  Negative for blurred vision and double vision.  Respiratory:  Negative for cough, hemoptysis and shortness of breath.   Cardiovascular:  Positive for palpitations. Negative for chest pain.  Gastrointestinal:  Positive for constipation (no bowel movement in 2 days).  Negative for blood in stool, melena, nausea and vomiting.  Genitourinary:  Negative for hematuria.  Musculoskeletal:  Positive for falls.  Neurological:  Positive for weakness and headaches. Negative for dizziness.  Endo/Heme/Allergies:  Does not bruise/bleed easily.   Physical Exam/Data:   Vitals:   05/13/21 2313 05/14/21 0400 05/14/21 0819 05/14/21 1203  BP: 123/70 132/76 123/81 104/65  Pulse: 60 63 94 77  Resp: (!) 21 (!) _0 Temp: 97.6 F (36.4 C) 97.8 F (36.6 C) 98.1 F (36.7 C) 97.6 F (36.4 C)  TempSrc: Oral Oral Oral Oral  SpO2: 97% 94% 96% 95%  Weight:      Height:        Intake/Output Summary (Last 24 hours) at 05/14/2021 1323 Last data filed at 05/14/2021 0950 Gross per 24 hour  Intake 360 ml  Output 800 ml  Net -440 ml   Last 3 Weights 05/13/2021 01/02/2021 10/30/2019  Weight (lbs) 180 lb 180 lb 184 lb 3.2 oz  Weight (kg) 81.647 kg 81.647 kg 83.553 kg     Body mass index is 29.95 kg/m.  General: 87 y.o. male resting comfortably in no acute distress. Very hard of hearing. HEENT: Normocephalic and atraumatic.  Neck: Supple. No carotid bruits. No JVD. Heart: Irregularly irregular rhythm with normal rate. Distinct S1 and S2. No murmurs, gallops, or rubs. Radial pulses 2+ and equal bilaterally. Lungs: No increased work of breathing. Mild crackles in bases. Abdomen: Soft, non-distended, and non-tender to palpation. Hernia present. MSK: Normal strength and tone for age. Extremities: No lower extremity edema.    Skin: Warm and dry. Neuro: Alert and oriented x3. No focal deficits. Psych: Normal affect. Responds appropriately.  EKG:  The following EKGs were personally reviewed: - EKG from 05/13/2021: Sinus tachycardia vs MAT, 100 bpm, with LVH, LAFB, and left axis deviation but no acute ischemic changes. - EKG from 05/14/2021: Atrial fibrillation, rate 108 bpm, with LVH, LAFB, left axis deviation, and possible Q waves in V1-V2 but no acute ischemic  changes.  Telemetry:  Telemetry was personally reviewed and demonstrates:  Went into atrial fibrillation  shortly before 7:00am. Rates as high as the 140s but currently in the 70s to 90s.  Relevant CV Studies:  Lexiscan Myoview 05/04/2017: Nuclear stress EF: 48%. No T wave inversion was noted during stress. There was no ST segment deviation noted during stress. This is an intermediate risk study.   No significant reversible ischemia. LVEF 48% with global hypokinesis. This is an intermediate risk study (due to EF <49%). _______________  Holter Monitor 05/2018: NSR PAC;s less than 5% PVC;s less than 5% NSVT 3-4 beats Short bursts of atrial tachycardia less than 6 beats  Laboratory Data:  High Sensitivity Troponin:   Recent Labs  Lab 05/13/21 0020 05/13/21 0155  TROPONINIHS 94* 136*     Chemistry Recent Labs  Lab 05/13/21 0020 05/13/21 1733 05/14/21 0111  NA 136 134* 137  K 3.9 3.9 4.2  CL 103 103 109  CO2 21* 21* 22  GLUCOSE 118* 149* 117*  BUN 38* 29* 37*  CREATININE 1.58* 1.19 1.26*  CALCIUM 9.1 8.3* 8.3*  GFRNONAA 42* 58* 55*  ANIONGAP _0 Recent Labs  Lab 05/13/21 0020 05/14/21 0111  PROT 7.2 6.4*  ALBUMIN 3.7 2.9*  AST 29 49*  ALT 15 19  ALKPHOS 35* 32*  BILITOT 0.6 0.6   Hematology Recent Labs  Lab 05/13/21 0020 05/13/21 1733 05/14/21 0111  WBC 8.1  --  7.8  RBC 3.73*  --  3.65*  HGB 9.7* 10.4* 9.4*  HCT 30.3* 33.1* 30.0*  MCV 81.2  --  82.2  MCH 26.0  --  25.8*  MCHC 32.0  --  31.3  RDW 18.0*  --  18.0*  PLT 143*  --  151   BNP Recent Labs  Lab 05/13/21 0020 05/14/21 0745  BNP 154.5* 404.2*    DDimer  Recent Labs  Lab 05/13/21 1733 05/14/21 0111  DDIMER 2.88* 2.75*     Radiology/Studies:  DG Chest 2 View  Result Date: 05/13/2021 CLINICAL DATA:  Fall EXAM: CHEST - 2 VIEW COMPARISON:  07/26/2009 FINDINGS: Lungs are clear.  No pleural effusion or pneumothorax. The heart is normal in size. Postsurgical changes related  to prior CABG. IMPRESSION: No evidence of acute cardiopulmonary disease. Electronically Signed   By: Julian Hy M.D.   On: 05/13/2021 01:09   DG Chest Port 1 View  Result Date: 05/14/2021 CLINICAL DATA:  Shortness of breath, COVID positive by report. History of heart surgery. EXAM: PORTABLE CHEST 1 VIEW COMPARISON:  May 13, 2021. FINDINGS: EKG leads project over the chest. Post median sternotomy for CABG. Signs of LIMA grafting. Cardiomediastinal contours and hilar structures are stable. Trachea is in the midline accounting for mild rotation and mild buckling due to low lung volumes. Signs of aortic atherosclerosis. Hiatal hernia projects posterior to the heart as on previous imaging. No sign of lobar consolidative changes. Vascular crowding without frank edema mild asymmetry of interstitial markings on the LEFT as compared to the RIGHT in the upper chest. No sign of effusion. No pneumothorax. On limited assessment no acute skeletal process. Signs of prior trauma to LEFT-sided ribs. Signs of cement augmentation lumbar and thoracic spine not well evaluated. IMPRESSION: 1. Mild pulmonary vascular congestion and or vascular crowding. Question of mild increased interstitial markings on the LEFT could reflect mild pneumonitis or early asymmetric pulmonary edema. 2. Hiatal hernia. 3. Post CABG. 4. Signs of aortic atherosclerosis. Aortic Atherosclerosis (ICD10-I70.0). Electronically Signed   By: Zetta Bills M.D.   On: 05/14/2021 08:33  DG Knee Complete 4 Views Left  Result Date: 05/13/2021 CLINICAL DATA:  Fall EXAM: LEFT KNEE - COMPLETE 4+ VIEW COMPARISON:  None. FINDINGS: No fracture or dislocation is seen. Mild degenerative changes with sharpening of the tibial spines, patellofemoral joint space narrowing with small osteophytes, and lateral compartment chondrocalcinosis. Visualized soft tissues are within normal limits. No suprapatellar knee joint effusion. IMPRESSION: No fracture or dislocation is seen.  Mild degenerative changes, as above. Electronically Signed   By: Julian Hy M.D.   On: 05/13/2021 01:10   DG Knee Complete 4 Views Right  Result Date: 05/13/2021 CLINICAL DATA:  Fall EXAM: RIGHT KNEE - COMPLETE 4+ VIEW COMPARISON:  None. FINDINGS: No fracture or dislocation is seen. Mild lateral compartment chondrocalcinosis. Joint spaces are essentially preserved. Mild prepatellar soft tissue swelling. IMPRESSION: Mild prepatellar soft tissue swelling. Electronically Signed   By: Julian Hy M.D.   On: 05/13/2021 01:11   DG HIP UNILAT W OR W/O PELVIS 2-3 VIEWS RIGHT  Result Date: 05/13/2021 CLINICAL DATA:  Fall EXAM: DG HIP (WITH OR WITHOUT PELVIS) 2-3V RIGHT COMPARISON:  None. FINDINGS: No fracture or dislocation is seen. Bilateral hip joint spaces are preserved. Visualized bony pelvis appears intact. Degenerative changes of the lower lumbar spine with prior vertebral augmentation. Vascular calcifications. IMPRESSION: Negative. Electronically Signed   By: Julian Hy M.D.   On: 05/13/2021 01:10     Assessment and Plan:   New Onset Atrial Fibrillation - Presented in normal sinus rhythm but went into atrial fibrillation shortly before 7:00am. New diagnosis. Rates as high as the 140s earlier today but currently well controlled. - Potassium 4.2. - Magnesium 1.9. - TSH pending. - Echo pending.  - Continue Lopressor 36m twice daily. - CHA2DS2-VASc = 4 (CAD, HTN, age x2). Would benefit from anticoagulation; however, patient has gait problems and presented after 2 falls. Discussed bleed risk vs stroke risk but patient very hard of hearing and I am not sure how much he understood. Will discuss anticoagulation with MD. Patient is asymptomatic so if decision is made to proceed with anticoagulation, could consider outpatient DCCV after 4 weeks of uninterrupted anticoagulation. However given advanced age and problems with ambulation, may be better to just focus on rate control at this  point.  Elevated Troponin - High-sensitivity troponin elevated at 94 >> 136.  - No acute ischemic changes on EKG. - Echo pending. - No chest pain.  - Possibly demand ischemia in setting of underlying COVID infection. Given advance age and no anginal complaints, do not suspect any additional inpatient ischemic work-up.  CAD  - S/p remote CABG in 1999. Myoview in 2018 showed no ischemia.  - No angina. - Currently on Plavix 776mdaily. If we start Eliquis, would likely need to stop this. - Continue statin.  Hypertension - BP mostly well controlled. - Continue Lopressor 5013mwice daily. - Home Ramipril currently on hold due to AKI.  Hyperlipidemia - Continue Lipitor 75m80mily.  AKI - Creatinine 1.58 on presentation. Improved to 1.26 today after IV fluids. Baseline from 2019 round 1.0. - Continue to hold home Ramipril. - Continue to monitor.  Otherwise, per primary team: - COVID-19  - Falls/gait disturbance - Normocytic anemia - Thrombocytopenia   Risk Assessment/Risk Scores:    CHA2DS2-VASc Score = 4  This indicates a 4.8% annual risk of stroke. The patient's score is based upon: CHF History: No HTN History: Yes Diabetes History: No Stroke History: No Vascular Disease History: Yes Age Score: 2 Gender Score: 0  For questions or updates, please contact [place] Please consult [email or web address] for contact info under    Signed, Darreld Mclean, PA-C  05/14/2021 1:23 PM As above, patient seen and examined.  Briefly he is an 87 year old male with past medical history of coronary artery disease status post coronary artery bypass graft, hypertension, hyperlipidemia, gait disorder admitted with COVID-19 for evaluation of atrial fibrillation.  Patient admitted on July 4 after falling at home.  He states his legs became weak but he did not have syncope.  He has occasional dyspnea but denies chest pain or palpitations.  Patient was found to be COVID-positive with some  dehydration and was admitted.  He was found to go into atrial fibrillation this morning and cardiology asked to evaluate.  Note he denies dyspnea, chest pain or palpitations at this time. BUN 37, creatinine 1.26, BNP 404, troponin 136, hemoglobin 9.4, TSH 0.877.Marland Kitchen  Electrocardiogram shows atrial fibrillation, left ventricular hypertrophy, cannot rule out septal infarct.  1 new onset atrial fibrillation-patient has developed atrial fibrillation but is asymptomatic.  Plan to continue metoprolol for rate control.  TSH is normal.  Atrial fibrillation likely secondary to the stress of COVID infection.  Plan echocardiogram to reassess LV function.  CHA2DS2-VASc 4.  He has fallen previously but based on his history this is not frequent.  I would favor short-term anticoagulation with apixaban 5 mg twice daily.  Hopefully this could be discontinued in the future if we feel that atrial fibrillation is potentially related to the stress of COVID infection.  Hopefully patient will convert on his own.  If not we will consider cardioversion in 4 to 6 weeks after he recovers from [place].  2 history of coronary artery disease-we will continue statin.  Given addition of apixaban will discontinue Plavix.  3 elevated troponin-minimal elevation and patient denies chest pain.  No plans for further ischemia evaluation.  4 acute kidney injury-improved with hydration.  We will continue to follow.  5 COVID infection-Per primary care.  Kirk Ruths, MD

## 2021-05-14 NOTE — NC FL2 (Signed)
[place] LEVEL OF CARE SCREENING TOOL     IDENTIFICATION  Patient Name: [name] Birthdate: 1929-09-22 Sex: male Admission Date (Current Location): 05/12/2021  [facility] and [place] Number:  [employer] and Address:  [facility] [place], [place] [address] [place], [place] [address]      Provider Number: [id number]  Attending Physician Name and Address:  Thurnell Lose, MD  Relative Name and Phone Number:  Butch Penny, daughter, [phone]    Current Level of Care: Hospital Recommended Level of Care: [place] Prior Approval Number:    Date Approved/Denied:   PASRR Number: [id number]  Discharge Plan: SNF    Current Diagnoses: Patient Active Problem List   Diagnosis Date Noted   Weakness 05/13/2021   Frequent falls 05/13/2021   Normocytic anemia 05/13/2021   AKI (acute kidney injury) ([place]) 05/13/2021   COVID-19 virus infection 05/13/2021   History of retinal detachment 04/25/2020   Primary open angle glaucoma of left eye, mild stage 04/25/2020   Pseudophakia, both eyes 04/25/2020   Cystoid macular edema of right eye 04/25/2020   Optic atrophy of right eye 04/25/2020   Multifactorial gait disorder 07/19/2015   Cataract 01/10/2014   Ventral hernia 01/10/2014   Elevated lipids 05/27/2010   CAD (coronary artery disease) 11/13/2009   SHORTNESS OF BREATH 11/13/2009   Essential hypertension 11/12/2009    Orientation RESPIRATION BLADDER Height & Weight     Self, Time, Situation, Place  Normal Continent, External catheter Weight: 180 lb (81.6 kg) Height:  5\' 5"  (165.1 cm)  BEHAVIORAL SYMPTOMS/MOOD NEUROLOGICAL BOWEL NUTRITION STATUS      Continent Diet (Please see DC Summary)  AMBULATORY STATUS COMMUNICATION OF NEEDS Skin   Extensive Assist Verbally Normal                       Personal Care Assistance Level of Assistance  Bathing, Feeding, Dressing Bathing Assistance: Maximum assistance Feeding  assistance: Limited assistance Dressing Assistance: Limited assistance     Functional Limitations Info  Hearing, Sight Sight Info: Impaired Hearing Info: Impaired      SPECIAL CARE FACTORS FREQUENCY  PT (By licensed PT), OT (By licensed OT)     PT Frequency: 5x/week OT Frequency: 5x/week            Contractures Contractures Info: Not present    Additional Factors Info  Code Status, Allergies, Isolation Precautions Code Status Info: Full Allergies Info: Succinylsulphathiazole, Other, Sulfonamide Derivatives     Isolation Precautions Info: COVID+ 05/13/21     Current Medications (05/14/2021):  This is the current hospital active medication list Current Facility-Administered Medications  Medication Dose Route Frequency Provider Last Rate Last Admin   0.9 %  sodium chloride infusion   Intravenous Continuous Thurnell Lose, MD 50 mL/hr at 05/14/21 0902 New Bag at 05/14/21 0902   albuterol (VENTOLIN HFA) 108 (90 Base) MCG/ACT inhaler 2 puff  2 puff Inhalation Q6H PRN Fuller Plan A, MD       apixaban (ELIQUIS) tablet 2.5 mg  2.5 mg Oral BID Lala Lund K, MD   2.5 mg at 05/14/21 1455   ascorbic acid (VITAMIN C) tablet 500 mg  500 mg Oral Daily Tamala Julian, Rondell A, MD   500 mg at 05/14/21 0911   atorvastatin (LIPITOR) tablet 20 mg  20 mg Oral q1800 Fuller Plan A, MD   20 mg at 05/13/21 2006   chlorpheniramine-HYDROcodone (TUSSIONEX) 10-8 MG/5ML suspension 5 mL  5 mL Oral Q12H PRN Fuller Plan A, MD       dexamethasone (DECADRON) injection 6 mg  6 mg Intravenous Q24H Thurnell Lose, MD   6 mg at 05/14/21 7353   diclofenac Sodium (VOLTAREN) 1 % topical gel 2 g  2 g Topical BID Fuller Plan A, MD   2 g at 05/14/21 0906   diltiazem (CARDIZEM) injection 10 mg  10 mg Intravenous Q6H PRN Thurnell Lose, MD       guaiFENesin-dextromethorphan (ROBITUSSIN DM) 100-10 MG/5ML syrup 10 mL  10 mL Oral Q4H PRN Smith, Rondell A, MD       latanoprost (XALATAN) 0.005 % ophthalmic  solution 1 drop  1 drop Left Eye QHS Smith, Rondell A, MD   1 drop at 05/13/21 2121   metoprolol tartrate (LOPRESSOR) tablet 50 mg  50 mg Oral BID Thurnell Lose, MD   50 mg at 05/14/21 1228   ondansetron (ZOFRAN) injection 4 mg  4 mg Intravenous Q6H PRN Fuller Plan A, MD       pantoprazole (PROTONIX) EC tablet 40 mg  40 mg Oral QODAY Smith, Rondell A, MD   40 mg at 05/14/21 0904   pneumococcal 23 valent vaccine (PNEUMOVAX-23) injection 0.5 mL  0.5 mL Intramuscular Tomorrow-1000 Thurnell Lose, MD       remdesivir 100 mg in sodium chloride 0.9 % 100 mL IVPB  100 mg Intravenous Daily Cardama, Grayce Sessions, MD 200 mL/hr at 05/14/21 0903 100 mg at 05/14/21 0903   sodium chloride flush (NS) 0.9 % injection 3 mL  3 mL Intravenous Q12H Smith, Rondell A, MD   3 mL at 05/14/21 0911   zinc sulfate capsule 220 mg  220 mg Oral Daily Fuller Plan A, MD   220 mg at 05/14/21 2992     Discharge Medications: Please see discharge summary for a list of discharge medications.  Relevant Imaging Results:  Relevant Lab Results:   Additional Information SSN: [id number]. Pfizer vaccines 12/18/19, 01/08/20, 10/04/20  Benard Halsted, LCSW

## 2021-05-14 NOTE — Progress Notes (Signed)
PT Cancellation Note  Patient Details Name: [name] MRN: [id number] DOB: January 24, 1929   Cancelled Treatment:    Reason Eval/Treat Not Completed: Medical issues which prohibited therapy Awaiting results of LE doppler for DVT r/o in context of elevated D-dimer/acute Covid. Note he was given Lovenox at 1017 so will need to at least wait until 1317 or negative doppler before initiating PT eval.   Ann Lions PT, DPT, PN1   Supplemental Physical Therapist [place]    Pager [phone] Acute Rehab Office [phone]

## 2021-05-14 NOTE — Progress Notes (Signed)
ANTICOAGULATION CONSULT NOTE - Initial Consult  Pharmacy Consult for Lovenox Indication: VTE prophylaxis  Allergies  Allergen Reactions   Succinylsulphathiazole Rash   Other Other (See Comments)   Sulfonamide Derivatives Itching and Rash    Patient Measurements: Height: 5\' 5"  (165.1 cm) Weight: 81.6 kg (180 lb) IBW/kg (Calculated) : 61.5  Vital Signs: Temp: 98.1 F (36.7 C) (07/06 0819) Temp Source: Oral (07/06 0819) BP: 123/81 (07/06 0819) Pulse Rate: 94 (07/06 0819)  Labs: Recent Labs    05/13/21 0020 05/13/21 0155 05/13/21 1733 05/13/21 1856 05/14/21 0111  HGB 9.7*  --  10.4*  --  9.4*  HCT 30.3*  --  33.1*  --  30.0*  PLT 143*  --   --   --  151  CREATININE 1.58*  --  1.19  --  1.26*  CKTOTAL  --   --   --  667*  --   TROPONINIHS 94* 136*  --   --   --     Estimated Creatinine Clearance: 39.1 mL/min (A) (by C-G formula based on SCr of 1.26 mg/dL (H)).   Medical History: Past Medical History:  Diagnosis Date   Basal cell carcinoma 06/02/2003   left bulb nose-[id number]   CAD    HYPERLIPIDEMIA    HYPERTENSION    Melanoma ([place]) 06/05/2003   left cheek-MOHS   Shortness of breath     Assessment: Anticoag: Hgb 9.4. Plts 151. Ddimer 2.75. Lovenox 40 (BMI<30, CrCl>30  Goal of Therapy:  VTE Prophx. Monitor platelets by anticoagulation protocol: Yes   Plan:  Lovenox 40mg /d per COVID dosing protocol.   Vondell Sowell S. Alford Highland, PharmD, BCPS Clinical Staff Pharmacist [email or web address] Alford Highland, [place] 05/14/2021,9:12 AM

## 2021-05-14 NOTE — TOC Initial Note (Signed)
Transition of Care [facility]) - Initial/Assessment Note    Patient Details  Name: [name] MRN: [id number] Date of Birth: June 11, 1929  Transition of Care [facility]) CM/SW Contact:    Benard Halsted, LCSW Phone Number: 05/14/2021, 5:09 PM  Clinical Narrative:                 CSW received consult for possible SNF placement at time of discharge. CSW spoke with patient's daughter. She reported that patient lives alone and given patient's current physical needs and fall risk, he will require SNF. Patient expressed understanding of PT recommendation and is agreeable to SNF placement at time of discharge. CSW presented only COVID SNF option of [facility] and daughter reported acceptance. CSW discussed insurance authorization process and provided Medicare SNF ratings list. Patient has received the COVID vaccines and booster. [facility] able to accept patient tomorrow pending medical stability and if ready tomorrow, will require MD to write phrase "does not need to remain in the hospital for 3 night inpatient stay due to [place]".   Skilled Nursing Rehab Facilities-   [email or web address] *Ratings updated quarterly   Ratings out of 5 possible   Name Address  Phone # Miner Inspection Overall  [facility] [address] [place] 5  4 4   [facility]  [address] [place], [place] [phone] 4 2 4 4   [facility] [place], [place] 2 3 1 1   [place], [place] 3 3 4 4   [facility] [address], [place] 3  2 2   [place] 2 2 4 4   [facility] [address], [place] 5 2 2 3   [facility] [address], [place] 4 2 2 2   [place] [facility] [place], [address] [place] [phone] 5 2 2 3   [facility] [phone]  [address], [address] [phone] 5 1 2 2   [facility] [address], [facility] [phone] 5 2 2 3   [place] [address] [place] 3 1 1 1      Expected Discharge Plan: [place] Barriers to Discharge: Continued Medical Work up   Patient Goals and CMS Choice Patient states their goals for this hospitalization and ongoing recovery are:: Rehab CMS Medicare.gov Compare Post Acute Care list provided to:: Patient Represenative (must comment) Choice offered to / list presented to : Patient, Adult Children  Expected Discharge Plan and Services Expected Discharge Plan: [place] In-house Referral: Clinical Social Work   Post Acute Care Choice: [place] Living arrangements for the past 2 months: [place]                                      Prior Living Arrangements/Services Living arrangements for the past 2 months: Single Family Home Lives with:: Self Patient language and need for interpreter reviewed:: Yes Do you feel safe going back to the place where you live?: Yes      Need for Family Participation in Patient Care: Yes (Comment) Care giver support system in place?: Yes (comment)   Criminal Activity/Legal Involvement Pertinent to Current Situation/Hospitalization: No - Comment as needed  Activities of Daily Living Home Assistive Devices/Equipment: Walker (specify type), Other (Comment) (Front wheel walker - was using at the time of  fall) ADL Screening (condition at time of admission) Patient's cognitive ability adequate to safely complete daily activities?: Yes Is the patient deaf or have difficulty hearing?: Yes Does the patient have difficulty seeing, even when wearing glasses/contacts?: Yes Does the patient have difficulty concentrating, remembering, or making decisions?: No Patient able to express need for assistance with ADLs?: Yes Does the patient have  difficulty dressing or bathing?: No Independently performs ADLs?: Yes (appropriate for developmental age) Does the patient have difficulty walking or climbing stairs?: Yes Weakness of Legs: Both Weakness of Arms/Hands: None  Permission Sought/Granted Permission sought to share information with : Facility Sport and exercise psychologist, Family Supports Permission granted to share information with : Yes, Verbal Permission Granted  Share Information with NAME: Lyn Records: Daughter [phone]  Permission granted to share info w AGENCY: SNFs        Emotional Assessment       Orientation: : Oriented to Self, Oriented to Place, Oriented to  Time, Oriented to Situation Alcohol / Substance Use: Not Applicable Psych Involvement: No (comment)  Admission diagnosis:  Cough [R05.9] Weakness [R53.1] Fall [W19.XXXA] Elevated troponin [R77.8] AKI (acute kidney injury) ([place]) [N17.9] COVID-19 virus infection [U07.1] Patient Active Problem List   Diagnosis Date Noted   Weakness 05/13/2021   Frequent falls 05/13/2021   Normocytic anemia 05/13/2021   AKI (acute kidney injury) ([place]) 05/13/2021   COVID-19 virus infection 05/13/2021   History of retinal detachment 04/25/2020   Primary open angle glaucoma of left eye, mild stage 04/25/2020   Pseudophakia, both eyes 04/25/2020   Cystoid macular edema of right eye 04/25/2020   Optic atrophy of right eye 04/25/2020   Multifactorial gait disorder 07/19/2015   Cataract 01/10/2014   Ventral hernia 01/10/2014   Elevated lipids 05/27/2010   CAD (coronary artery disease) 11/13/2009   SHORTNESS OF BREATH 11/13/2009   Essential hypertension 11/12/2009   PCP:  [name], MD Pharmacy:   [id number] [place], [place] [address] Phone: [phone] Fax: [phone]  [facility]  ([place], [place]  [address] Phone: [phone] Fax: [phone]     Social Determinants of Health (SDOH) Interventions    Readmission Risk Interventions No flowsheet data found.

## 2021-05-15 ENCOUNTER — Encounter (HOSPITAL_COMMUNITY): Payer: Medicare Other

## 2021-05-15 DIAGNOSIS — I4819 Other persistent atrial fibrillation: Secondary | ICD-10-CM

## 2021-05-15 LAB — CBC WITH DIFFERENTIAL/PLATELET
Abs Immature Granulocytes: 0.03 10*3/uL (ref 0.00–0.07)
Basophils Absolute: 0 10*3/uL (ref 0.0–0.1)
Basophils Relative: 0 %
Eosinophils Absolute: 0 10*3/uL (ref 0.0–0.5)
Eosinophils Relative: 0 %
HCT: 29.4 % — ABNORMAL LOW (ref 39.0–52.0)
Hemoglobin: 9.3 g/dL — ABNORMAL LOW (ref 13.0–17.0)
Immature Granulocytes: 0 %
Lymphocytes Relative: 18 %
Lymphs Abs: 1.5 10*3/uL (ref 0.7–4.0)
MCH: 25.8 pg — ABNORMAL LOW (ref 26.0–34.0)
MCHC: 31.6 g/dL (ref 30.0–36.0)
MCV: 81.4 fL (ref 80.0–100.0)
Monocytes Absolute: 0.5 10*3/uL (ref 0.1–1.0)
Monocytes Relative: 6 %
Neutro Abs: 6.5 10*3/uL (ref 1.7–7.7)
Neutrophils Relative %: 76 %
Platelets: 156 10*3/uL (ref 150–400)
RBC: 3.61 MIL/uL — ABNORMAL LOW (ref 4.22–5.81)
RDW: 18.1 % — ABNORMAL HIGH (ref 11.5–15.5)
WBC: 8.5 10*3/uL (ref 4.0–10.5)
nRBC: 0 % (ref 0.0–0.2)

## 2021-05-15 LAB — COMPREHENSIVE METABOLIC PANEL
ALT: 22 U/L (ref 0–44)
AST: 52 U/L — ABNORMAL HIGH (ref 15–41)
Albumin: 2.8 g/dL — ABNORMAL LOW (ref 3.5–5.0)
Alkaline Phosphatase: 31 U/L — ABNORMAL LOW (ref 38–126)
Anion gap: 5 (ref 5–15)
BUN: 41 mg/dL — ABNORMAL HIGH (ref 8–23)
CO2: 21 mmol/L — ABNORMAL LOW (ref 22–32)
Calcium: 8.1 mg/dL — ABNORMAL LOW (ref 8.9–10.3)
Chloride: 111 mmol/L (ref 98–111)
Creatinine, Ser: 1 mg/dL (ref 0.61–1.24)
GFR, Estimated: >60 mL/min (ref >60)
Glucose, Bld: 110 mg/dL — ABNORMAL HIGH (ref 70–99)
Potassium: 4.5 mmol/L (ref 3.5–5.1)
Sodium: 137 mmol/L (ref 135–145)
Total Bilirubin: 0.6 mg/dL (ref 0.3–1.2)
Total Protein: 5.8 g/dL — ABNORMAL LOW (ref 6.5–8.1)

## 2021-05-15 LAB — BRAIN NATRIURETIC PEPTIDE: B Natriuretic Peptide: 538.2 pg/mL — ABNORMAL HIGH (ref 0.0–100.0)

## 2021-05-15 LAB — MAGNESIUM: Magnesium: 1.9 mg/dL (ref 1.7–2.4)

## 2021-05-15 LAB — D-DIMER, QUANTITATIVE: D-Dimer, Quant: 1.2 ug/mL-FEU — ABNORMAL HIGH (ref 0.00–0.50)

## 2021-05-15 LAB — C-REACTIVE PROTEIN: CRP: 0.8 mg/dL (ref <1.0)

## 2021-05-15 MED ORDER — APIXABAN 5 MG PO TABS
5.0000 mg | ORAL_TABLET | Freq: Two times a day (BID) | ORAL | Status: DC
  Administered 2021-05-15 – 2021-05-17 (×5): 5 mg via ORAL
  Filled 2021-05-15 (×5): qty 1

## 2021-05-15 NOTE — Progress Notes (Signed)
PROGRESS NOTE                                                                                                                                                                                                             Patient Demographics:    [name], is a 87 y.o. male, DOB - February 17, 1929, MCN:[id number]  Outpatient Primary MD for the patient is [name], MD    LOS - 2  Admit date - 05/12/2021    Chief Complaint  Patient presents with   Fall       Brief Narrative (HPI from H&P) - [name] is a 87 y.o. male with medical history significant of hypertension, hyperlipidemia, CAD s/p CABG, and gait disturbance (walks with a walker) who presents after having 2 falls yesterday, per wife 3-4 falls in the last 1 yr, has had 3 shots of COVID-vaccine so far, in the ER he was diagnosed with incidental COVID-19 infection, dehydration and AKI, admitted for further care.   Subjective:    [name] today has, No headache, No chest pain, No abdominal pain - No Nausea, No new weakness tingling or numbness, no SOB.   Assessment  & Plan :     Acute Covid 19 Viral  Infection  - he has received 3 shots of COVID-19 vaccine, he has COVID-related systemic symptoms causing dehydration and AKI, CRP is mildly elevated and D-dimer is moderately elevated, he is being treated with IV fluids for dehydration and AKI, low-dose IV steroids and 3 doses of remdesivir.  negative lower extremity venous duplex .  Encouraged the patient to sit up in chair in the daytime use I-S and flutter valve for pulmonary toiletry.  Will advance activity and titrate down oxygen as possible.  SpO2: 95 %  Recent Labs  Lab 05/13/21 0020 05/13/21 0132 05/13/21 1733 05/14/21 0111 05/14/21 0745 05/15/21 0106  WBC 8.1  --   --  7.8  --  8.5  HGB 9.7*  --  10.4* 9.4*  --  9.3*  HCT 30.3*  --  33.1* 30.0*  --  29.4*  PLT 143*  --   --  151  --  156   CRP  --   --  1.5* 1.3*  --  0.8  BNP 154.5*  --   --   --  404.2* 538.2*  DDIMER  --   --  2.88* 2.75*  --  1.20*  PROCALCITON  --   --  <0.10  --   --   --   AST 29  --   --  49*  --  52*  ALT 15  --   --  19  --  22  ALKPHOS 35*  --   --  32*  --  31*  BILITOT 0.6  --   --  0.6  --  0.6  ALBUMIN 3.7  --   --  2.9*  --  2.8*  SARSCOV2NAA  --  POSITIVE*  --   --   --   --    Lab Results  Component Value Date   TSH 0.877 05/14/2021     2.  Dehydration with AKI.  Hydrate with IV fluids and avoid nephrotoxins.  3.  New onset A. fib.  Currently in RVR, [place] vas 2 score of greater than 3.  He has had 3-4 falls over the last 1 year but still has decent quality of life, discussed risks and benefits with wife and patient, they would like to consider anticoagulation and avoid ischemic stroke if possible.  For now we will get a EKG, stable TSH, pending echo, rate control better after being placed on oral beta-blocker, currently on Eliquis, cardiology on board.    4.  CAD s/p CABG in 2019.  No acute issues follows with Dr. Johnsie Cancel, echo pending, on Plavix which will be continued, eventually oral beta-blocker if possible  5.  Balance with 3-4 falls over the last 1 year.  PT OT, may require SNF.  6.  Mild thrombocytopenia could be due to viral illness.  Improving with supportive care.  7.  Dyslipidemia.  On statin.  8.  Essential hypertension.  Blood pressure was soft hence ACE inhibitor being held, monitor may require beta-blocker for A. fib.  Obesity: Estimated body mass index is 29.95 kg/m as calculated from the following:   Height as of this encounter: 5\' 5"  (1.651 m).   Weight as of this encounter: 81.6 kg.          Condition - Fair  Family Communication  :  wife on 05/14/21  Code Status :  Full  Consults  :  Cards  PUD Prophylaxis : PPI   Procedures  :     TTE      Disposition Plan  :    Status is: Inpatient  Remains inpatient appropriate because:IV treatments  appropriate due to intensity of illness or inability to take PO  Dispo: The patient is from: Home              Anticipated d/c is to: Home              Patient currently is not medically stable to d/c.   Difficult to place patient Yes  DVT Prophylaxis  :    SCDs Start: 05/13/21 1701 apixaban (ELIQUIS) tablet 5 mg  Lab Results  Component Value Date   PLT 156 05/15/2021    Diet :  Diet Order             Diet Heart Room service appropriate? Yes; Fluid consistency: Thin  Diet effective now                    Inpatient Medications  Scheduled Meds:   apixaban  5 mg Oral BID   vitamin C  500 mg Oral Daily  atorvastatin  20 mg Oral q1800   dexamethasone (DECADRON) injection  6 mg Intravenous Q24H   diclofenac Sodium  2 g Topical BID   latanoprost  1 drop Left Eye QHS   metoprolol tartrate  50 mg Oral BID   pantoprazole  40 mg Oral QODAY   pneumococcal 23 valent vaccine  0.5 mL Intramuscular Tomorrow-1000   sodium chloride flush  3 mL Intravenous Q12H   zinc sulfate  220 mg Oral Daily   Continuous Infusions:  remdesivir 100 mg in NS 100 mL 100 mg (05/15/21 0904)   PRN Meds:.albuterol, chlorpheniramine-HYDROcodone, diltiazem, guaiFENesin-dextromethorphan, [DISCONTINUED] ondansetron **OR** ondansetron (ZOFRAN) IV  Antibiotics  :    Anti-infectives (From admission, onward)    Start     Dose/Rate Route Frequency Ordered Stop   05/14/21 1000  remdesivir 100 mg in sodium chloride 0.9 % 100 mL IVPB       See Hyperspace for full Linked Orders Report.   100 mg 200 mL/hr over 30 Minutes Intravenous Daily 05/13/21 0402 05/18/21 0959   05/13/21 0430  remdesivir 100 mg in sodium chloride 0.9 % 100 mL IVPB       See Hyperspace for full Linked Orders Report.   100 mg 200 mL/hr over 30 Minutes Intravenous Every 30 min 05/13/21 0402 05/13/21 0526        Time Spent in minutes  30   Lala Lund M.D on 05/15/2021 at 11:39 AM  To page go to [email or web address]   [facility] -  Office  [phone]  See all Orders from today for further details    Objective:   Vitals:   05/14/21 2038 05/14/21 2355 05/15/21 0511 05/15/21 0737  BP: 106/87 113/69 112/82 122/67  Pulse: 84 69 71 70  Resp: 20 20 20 20   Temp: 98 F (36.7 C) 97.6 F (36.4 C) 98 F (36.7 C) 97.6 F (36.4 C)  TempSrc: Oral Oral Oral Oral  SpO2: 94% 96% 95% 95%  Weight:      Height:        Wt Readings from Last 3 Encounters:  05/13/21 81.6 kg  01/02/21 81.6 kg  10/30/19 83.6 kg     Intake/Output Summary (Last 24 hours) at 05/15/2021 1139 Last data filed at 05/15/2021 1121 Gross per 24 hour  Intake 578.85 ml  Output 1301 ml  Net -722.15 ml     Physical Exam  Awake Alert, No new F.N deficits, Normal affect [place].AT,PERRAL Supple Neck,No JVD, No cervical lymphadenopathy appriciated.  Symmetrical Chest wall movement, Good air movement bilaterally, CTAB RRR,No Gallops, Rubs or new Murmurs, No Parasternal Heave +ve B.Sounds, Abd Soft, No tenderness, No organomegaly appriciated, No rebound - guarding or rigidity. No Cyanosis, Clubbing or edema, No new Rash or bruise     Data Review:    CBC Recent Labs  Lab 05/13/21 0020 05/13/21 1733 05/14/21 0111 05/15/21 0106  WBC 8.1  --  7.8 8.5  HGB 9.7* 10.4* 9.4* 9.3*  HCT 30.3* 33.1* 30.0* 29.4*  PLT 143*  --  151 156  MCV 81.2  --  82.2 81.4  MCH 26.0  --  25.8* 25.8*  MCHC 32.0  --  31.3 31.6  RDW 18.0*  --  18.0* 18.1*  LYMPHSABS 1.1  --  1.3 1.5  MONOABS 1.3*  --  0.8 0.5  EOSABS 0.0  --  0.0 0.0  BASOSABS 0.0  --  0.0 0.0    Recent Labs  Lab 05/13/21 0020 05/13/21 1733 05/14/21 0111 05/14/21 0745  05/14/21 1034 05/15/21 0106  NA 136 134* 137  --   --  137  K 3.9 3.9 4.2  --   --  4.5  CL 103 103 109  --   --  111  CO2 21* 21* 22  --   --  21*  GLUCOSE 118* 149* 117*  --   --  110*  BUN 38* 29* 37*  --   --  41*  CREATININE 1.58* 1.19 1.26*  --   --  1.00  CALCIUM 9.1 8.3* 8.3*  --   --  8.1*  AST  29  --  49*  --   --  52*  ALT 15  --  19  --   --  22  ALKPHOS 35*  --  32*  --   --  31*  BILITOT 0.6  --  0.6  --   --  0.6  ALBUMIN 3.7  --  2.9*  --   --  2.8*  MG  --   --  1.9  --   --  1.9  CRP  --  1.5* 1.3*  --   --  0.8  DDIMER  --  2.88* 2.75*  --   --  1.20*  PROCALCITON  --  <0.10  --   --   --   --   TSH  --   --   --   --  0.877  --   BNP 154.5*  --   --  404.2*  --  538.2*   Lab Results  Component Value Date   TSH 0.877 05/14/2021     Radiology Reports DG Chest 2 View  Result Date: 05/13/2021 CLINICAL DATA:  Fall EXAM: CHEST - 2 VIEW COMPARISON:  07/26/2009 FINDINGS: Lungs are clear.  No pleural effusion or pneumothorax. The heart is normal in size. Postsurgical changes related to prior CABG. IMPRESSION: No evidence of acute cardiopulmonary disease. Electronically Signed   By: Julian Hy M.D.   On: 05/13/2021 01:09   DG Chest Port 1 View  Result Date: 05/14/2021 CLINICAL DATA:  Shortness of breath, COVID positive by report. History of heart surgery. EXAM: PORTABLE CHEST 1 VIEW COMPARISON:  May 13, 2021. FINDINGS: EKG leads project over the chest. Post median sternotomy for CABG. Signs of LIMA grafting. Cardiomediastinal contours and hilar structures are stable. Trachea is in the midline accounting for mild rotation and mild buckling due to low lung volumes. Signs of aortic atherosclerosis. Hiatal hernia projects posterior to the heart as on previous imaging. No sign of lobar consolidative changes. Vascular crowding without frank edema mild asymmetry of interstitial markings on the LEFT as compared to the RIGHT in the upper chest. No sign of effusion. No pneumothorax. On limited assessment no acute skeletal process. Signs of prior trauma to LEFT-sided ribs. Signs of cement augmentation lumbar and thoracic spine not well evaluated. IMPRESSION: 1. Mild pulmonary vascular congestion and or vascular crowding. Question of mild increased interstitial markings on the LEFT could  reflect mild pneumonitis or early asymmetric pulmonary edema. 2. Hiatal hernia. 3. Post CABG. 4. Signs of aortic atherosclerosis. Aortic Atherosclerosis (ICD10-I70.0). Electronically Signed   By: Zetta Bills M.D.   On: 05/14/2021 08:33   DG Knee Complete 4 Views Left  Result Date: 05/13/2021 CLINICAL DATA:  Fall EXAM: LEFT KNEE - COMPLETE 4+ VIEW COMPARISON:  None. FINDINGS: No fracture or dislocation is seen. Mild degenerative changes with sharpening of the tibial spines, patellofemoral joint space narrowing with  small osteophytes, and lateral compartment chondrocalcinosis. Visualized soft tissues are within normal limits. No suprapatellar knee joint effusion. IMPRESSION: No fracture or dislocation is seen. Mild degenerative changes, as above. Electronically Signed   By: Julian Hy M.D.   On: 05/13/2021 01:10   DG Knee Complete 4 Views Right  Result Date: 05/13/2021 CLINICAL DATA:  Fall EXAM: RIGHT KNEE - COMPLETE 4+ VIEW COMPARISON:  None. FINDINGS: No fracture or dislocation is seen. Mild lateral compartment chondrocalcinosis. Joint spaces are essentially preserved. Mild prepatellar soft tissue swelling. IMPRESSION: Mild prepatellar soft tissue swelling. Electronically Signed   By: Julian Hy M.D.   On: 05/13/2021 01:11   DG HIP UNILAT W OR W/O PELVIS 2-3 VIEWS RIGHT  Result Date: 05/13/2021 CLINICAL DATA:  Fall EXAM: DG HIP (WITH OR WITHOUT PELVIS) 2-3V RIGHT COMPARISON:  None. FINDINGS: No fracture or dislocation is seen. Bilateral hip joint spaces are preserved. Visualized bony pelvis appears intact. Degenerative changes of the lower lumbar spine with prior vertebral augmentation. Vascular calcifications. IMPRESSION: Negative. Electronically Signed   By: Julian Hy M.D.   On: 05/13/2021 01:10

## 2021-05-15 NOTE — NC FL2 (Signed)
[place] LEVEL OF CARE SCREENING TOOL     IDENTIFICATION  Patient Name: [name] Birthdate: 1929/10/03 Sex: male Admission Date (Current Location): 05/12/2021  [facility] and [place] Number:  [employer] and Address:  [facility] [place], [place] [address] [place], [place] [address]      Provider Number: [id number]  Attending Physician Name and Address:  Thurnell Lose, MD  Relative Name and Phone Number:  Butch Penny, daughter, [phone]    Current Level of Care: Hospital Recommended Level of Care: Assisted Living Facility Prior Approval Number:    Date Approved/Denied:   PASRR Number: [id number]  Discharge Plan: Other (Comment) (ALF)    Current Diagnoses: Patient Active Problem List   Diagnosis Date Noted   Weakness 05/13/2021   Frequent falls 05/13/2021   Normocytic anemia 05/13/2021   AKI (acute kidney injury) ([place]) 05/13/2021   COVID-19 virus infection 05/13/2021   History of retinal detachment 04/25/2020   Primary open angle glaucoma of left eye, mild stage 04/25/2020   Pseudophakia, both eyes 04/25/2020   Cystoid macular edema of right eye 04/25/2020   Optic atrophy of right eye 04/25/2020   Multifactorial gait disorder 07/19/2015   Cataract 01/10/2014   Ventral hernia 01/10/2014   Elevated lipids 05/27/2010   CAD (coronary artery disease) 11/13/2009   SHORTNESS OF BREATH 11/13/2009   Essential hypertension 11/12/2009    Orientation RESPIRATION BLADDER Height & Weight     Self, Time, Situation, Place  Normal Continent, External catheter Weight: 180 lb (81.6 kg) Height:  5\' 5"  (165.1 cm)  BEHAVIORAL SYMPTOMS/MOOD NEUROLOGICAL BOWEL NUTRITION STATUS      Continent Diet (Regular, no added salt)  AMBULATORY STATUS COMMUNICATION OF NEEDS Skin   Extensive Assist Verbally Other (Comment) (Non pressure wound on knee (no dressing))                       Personal Care Assistance Level of Assistance  Bathing,  Feeding, Dressing Bathing Assistance: Maximum assistance Feeding assistance: Limited assistance Dressing Assistance: Limited assistance     Functional Limitations Info  Hearing, Sight Sight Info: Impaired Hearing Info: Impaired      SPECIAL CARE FACTORS FREQUENCY  PT (By licensed PT), OT (By licensed OT)     PT Frequency: 5x/week OT Frequency: 5x/week            Contractures Contractures Info: Not present    Additional Factors Info  Code Status, Allergies, Isolation Precautions Code Status Info: Full Allergies Info: Succinylsulphathiazole, Other, Sulfonamide Derivatives     Isolation Precautions Info: COVID+ 05/13/21     Current Medications (05/15/2021):  This is the current hospital active medication list Current Facility-Administered Medications  Medication Dose Route Frequency Provider Last Rate Last Admin   albuterol (VENTOLIN HFA) 108 (90 Base) MCG/ACT inhaler 2 puff  2 puff Inhalation Q6H PRN Fuller Plan A, MD       apixaban (ELIQUIS) tablet 5 mg  5 mg Oral BID Sande Rives E, PA-C   5 mg at 05/15/21 1027   ascorbic acid (VITAMIN C) tablet 500 mg  500 mg Oral Daily Tamala Julian, Rondell A, MD   500 mg at 05/15/21 0905   atorvastatin (LIPITOR) tablet 20 mg  20 mg Oral q1800 Smith, Rondell A, MD   20 mg at 05/14/21 1750   chlorpheniramine-HYDROcodone (TUSSIONEX) 10-8 MG/5ML suspension 5 mL  5 mL Oral Q12H PRN Norval Morton, MD       dexamethasone (  DECADRON) injection 6 mg  6 mg Intravenous Q24H Thurnell Lose, MD   6 mg at 05/15/21 4715   diclofenac Sodium (VOLTAREN) 1 % topical gel 2 g  2 g Topical BID Fuller Plan A, MD   2 g at 05/15/21 0905   diltiazem (CARDIZEM) injection 10 mg  10 mg Intravenous Q6H PRN Thurnell Lose, MD       guaiFENesin-dextromethorphan (ROBITUSSIN DM) 100-10 MG/5ML syrup 10 mL  10 mL Oral Q4H PRN Smith, Rondell A, MD       latanoprost (XALATAN) 0.005 % ophthalmic solution 1 drop  1 drop Left Eye QHS Smith, Rondell A, MD   1 drop at  05/14/21 2102   metoprolol tartrate (LOPRESSOR) tablet 50 mg  50 mg Oral BID Thurnell Lose, MD   50 mg at 05/15/21 0905   ondansetron (ZOFRAN) injection 4 mg  4 mg Intravenous Q6H PRN Fuller Plan A, MD       pantoprazole (PROTONIX) EC tablet 40 mg  40 mg Oral QODAY Smith, Rondell A, MD   40 mg at 05/14/21 0904   pneumococcal 23 valent vaccine (PNEUMOVAX-23) injection 0.5 mL  0.5 mL Intramuscular Tomorrow-1000 Thurnell Lose, MD       remdesivir 100 mg in sodium chloride 0.9 % 100 mL IVPB  100 mg Intravenous Daily Cardama, Grayce Sessions, MD 200 mL/hr at 05/15/21 0904 100 mg at 05/15/21 0904   sodium chloride flush (NS) 0.9 % injection 3 mL  3 mL Intravenous Q12H Smith, Rondell A, MD   3 mL at 05/15/21 8063   zinc sulfate capsule 220 mg  220 mg Oral Daily Fuller Plan A, MD   220 mg at 05/15/21 8685     Discharge Medications: Please see discharge summary for a list of discharge medications.  Relevant Imaging Results:  Relevant Lab Results:   Additional Information SSN: [id number]. Pfizer vaccines 12/18/19, 01/08/20, 10/04/20  Benard Halsted, LCSW

## 2021-05-15 NOTE — Progress Notes (Addendum)
Progress Note  Patient Name: [name] Date of Encounter: 05/15/2021  [facility] Cardiologist: [name], MD   Subjective   No acute overnight events. Patient states he feels well this morning. No chest pain, shortness of breath, or palpitations. He does have a dry sounding cough. He states he feels better than yesterday.  Inpatient Medications    Scheduled Meds:  apixaban  2.5 mg Oral BID   vitamin C  500 mg Oral Daily   atorvastatin  20 mg Oral q1800   dexamethasone (DECADRON) injection  6 mg Intravenous Q24H   diclofenac Sodium  2 g Topical BID   latanoprost  1 drop Left Eye QHS   metoprolol tartrate  50 mg Oral BID   pantoprazole  40 mg Oral QODAY   pneumococcal 23 valent vaccine  0.5 mL Intramuscular Tomorrow-1000   sodium chloride flush  3 mL Intravenous Q12H   zinc sulfate  220 mg Oral Daily   Continuous Infusions:  sodium chloride 50 mL/hr at 05/14/21 1739   remdesivir 100 mg in NS 100 mL Stopped (05/14/21 0943)   PRN Meds: albuterol, chlorpheniramine-HYDROcodone, diltiazem, guaiFENesin-dextromethorphan, [DISCONTINUED] ondansetron **OR** ondansetron (ZOFRAN) IV   Vital Signs    Vitals:   05/14/21 2038 05/14/21 2355 05/15/21 0511 05/15/21 0737  BP: 106/87 113/69 112/82 122/67  Pulse: 84 69 71 70  Resp: 20 20 20 20   Temp: 98 F (36.7 C) 97.6 F (36.4 C) 98 F (36.7 C) 97.6 F (36.4 C)  TempSrc: Oral Oral Oral Oral  SpO2: 94% 96% 95% 95%  Weight:      Height:        Intake/Output Summary (Last 24 hours) at 05/15/2021 0741 Last data filed at 05/15/2021 0500 Gross per 24 hour  Intake 702.85 ml  Output 1651 ml  Net -948.15 ml   Last 3 Weights 05/13/2021 01/02/2021 10/30/2019  Weight (lbs) 180 lb 180 lb 184 lb 3.2 oz  Weight (kg) 81.647 kg 81.647 kg 83.553 kg      Telemetry    Atrial fibrillation with rates in the 60s to 29s. Multiple PVCs noted. One pause of 2.16 seconds noted.- Personally Reviewed  ECG    No new ECG tracing today. -  Personally Reviewed  Physical Exam   GEN: Elderly Caucsian male. No acute distress. Hard of hearing. Cardiac: Irregularly irregular rhythm with normal rate. No murmurs, rubs, or gallops.  Respiratory: No increased work of breathing. Possible faints crackles in bilateral bases as well as rare scattered wheezing. GI: Soft, non-distended, and non-tender. Hernia present. MS: No lower extremity edema. No deformity. Skin: Warm and dry. Neuro:  No focal deficits. Psych: Normal affect.   Labs    High Sensitivity Troponin:   Recent Labs  Lab 05/13/21 0020 05/13/21 0155  TROPONINIHS 94* 136*      Chemistry Recent Labs  Lab 05/13/21 0020 05/13/21 1733 05/14/21 0111 05/15/21 0106  NA 136 134* 137 137  K 3.9 3.9 4.2 4.5  CL 103 103 109 111  CO2 21* 21* 22 21*  GLUCOSE 118* 149* 117* 110*  BUN 38* 29* 37* 41*  CREATININE 1.58* 1.19 1.26* 1.00  CALCIUM 9.1 8.3* 8.3* 8.1*  PROT 7.2  --  6.4* 5.8*  ALBUMIN 3.7  --  2.9* 2.8*  AST 29  --  49* 52*  ALT 15  --  19 22  ALKPHOS 35*  --  32* 31*  BILITOT 0.6  --  0.6 0.6  GFRNONAA 42* 58* 55* >60  ANIONGAP 12 10 6 5      Hematology Recent Labs  Lab 05/13/21 0020 05/13/21 1733 05/14/21 0111 05/15/21 0106  WBC 8.1  --  7.8 8.5  RBC 3.73*  --  3.65* 3.61*  HGB 9.7* 10.4* 9.4* 9.3*  HCT 30.3* 33.1* 30.0* 29.4*  MCV 81.2  --  82.2 81.4  MCH 26.0  --  25.8* 25.8*  MCHC 32.0  --  31.3 31.6  RDW 18.0*  --  18.0* 18.1*  PLT 143*  --  151 156    BNP Recent Labs  Lab 05/13/21 0020 05/14/21 0745 05/15/21 0106  BNP 154.5* 404.2* 538.2*     DDimer  Recent Labs  Lab 05/13/21 1733 05/14/21 0111 05/15/21 0106  DDIMER 2.88* 2.75* 1.20*     Radiology    DG Chest Port 1 View  Result Date: 05/14/2021 CLINICAL DATA:  Shortness of breath, COVID positive by report. History of heart surgery. EXAM: PORTABLE CHEST 1 VIEW COMPARISON:  May 13, 2021. FINDINGS: EKG leads project over the chest. Post median sternotomy for CABG. Signs  of LIMA grafting. Cardiomediastinal contours and hilar structures are stable. Trachea is in the midline accounting for mild rotation and mild buckling due to low lung volumes. Signs of aortic atherosclerosis. Hiatal hernia projects posterior to the heart as on previous imaging. No sign of lobar consolidative changes. Vascular crowding without frank edema mild asymmetry of interstitial markings on the LEFT as compared to the RIGHT in the upper chest. No sign of effusion. No pneumothorax. On limited assessment no acute skeletal process. Signs of prior trauma to LEFT-sided ribs. Signs of cement augmentation lumbar and thoracic spine not well evaluated. IMPRESSION: 1. Mild pulmonary vascular congestion and or vascular crowding. Question of mild increased interstitial markings on the LEFT could reflect mild pneumonitis or early asymmetric pulmonary edema. 2. Hiatal hernia. 3. Post CABG. 4. Signs of aortic atherosclerosis. Aortic Atherosclerosis (ICD10-I70.0). Electronically Signed   By: Zetta Bills M.D.   On: 05/14/2021 08:33    Cardiac Studies   Lexiscan Myoview 05/04/2017: Nuclear stress EF: 48%. No T wave inversion was noted during stress. There was no ST segment deviation noted during stress. This is an intermediate risk study.   No significant reversible ischemia. LVEF 48% with global hypokinesis. This is an intermediate risk study (due to EF <49%). _______________   Holter Monitor 05/2018: NSR PAC;s less than 5% PVC;s less than 5% NSVT 3-4 beats Short bursts of atrial tachycardia less than 6 beats  Patient Profile     87 y.o. male with a history of CAD s/p remote CABG in 1999, hypertension, hyperlipidemia, basal cell carcinoma, multifactorial gait disorder, and remote smoking history (quit in the 1980s) who is being seen today for the evaluation of atrial fibrillation in the setting of COVID-19 at the request of Dr. Candiss Norse.  Assessment & Plan    New Onset Atrial Fibrillation - Rates well  controlled. - Electrolytes OK. - TSH normal. - Echo has been done but formal read pending. - Continue Lopressor 50mg  twice daily. - CHA2DS2-VASc = 4 (CAD, HTN, age x2). Currently on Eliquis 2.5mg  twice daily. He does not meet criteria for reduced dosing at this time (age 55, creatinine initially >1.5 but has since improved to baseline, and weight 81.6kg). Therefore, change to 5mg  twice daily. The hope is for short-term anticoagulation given history of falls. May be able to be discontinued in the future if we feel that atrial fibrillation is potentially related to the stress of COVID  infection. Hopefully patient will convert on his own. If not, can consider outpatient cardioversion in 4 weeks after he recovers from [place].  Elevated Troponin - High-sensitivity troponin elevated at 94 >> 136. - No acute ischemic changes on EKG. - Echo has been done but formal read pending. - No chest pain. - Possibly demand ischemia in setting of underlying COVID infection. Given advance age and no anginal complaints, no plans for additional ischemic work-up.   CAD - S/p remote CABG in 1999. Myoview in 2018 showed no ischemia. - No angina. - Home Plavix stopped due to need for Eliquis. - Continue statin.   Hypertension - BP mostly well controlled. - Continue Lopressor 50mg  twice daily. - Home Ramipril held due to AKI. Renal function as improved but BP is currently well controlled on beta blocker so may not need to add this back.   Hyperlipidemia - Continue Lipitor 20mg  daily.   AKI - Creatinine 1.58 on presentation. Received IV fluids in the ED. Back to baseline of 1.0 today.  - Continue to monitor.   Otherwise, per primary team: - COVID-19 - Falls/gait disturbance - Normocytic anemia - Thrombocytopenia  - Hypoalbuminemia - Mildly elevate LFTs  For questions or updates, please contact [facility] Please consult [email or web address] for contact info under   Signed, Darreld Mclean, PA-C   05/15/2021, 7:41 AM   As above, patient remains in atrial fibrillation but heart rate is controlled.  Continue metoprolol at present dose.  Continue apixaban but increase to 5 mg twice daily.  Plan will be to reassess in the office in 4 to 6 weeks.  If atrial fibrillation persists can proceed with cardioversion.  Question whether stress of COVID contributed to atrial fibrillation.  He had does have a history of falls and hopefully if we feel atrial fibrillation may be COVID-related he would not require long-term anticoagulation.  Cardiology will sign off.  Continue present cardiac medications at discharge.  We will arrange follow-up with APP in 4 to 6 weeks.  Please call with questions  Kirk Ruths, MD

## 2021-05-15 NOTE — TOC Progression Note (Addendum)
Transition of Care [facility]) - Progression Note    Patient Details  Name: [name] MRN: [id number] Date of Birth: 08/07/1929  Transition of Care [facility]) CM/SW [place], LCSW Phone Number: 05/15/2021, 9:11 AM  Clinical Narrative:    9:11am-CSW received voicemail from patient's daughter expressing concern about SNF. CSW left her a return voicemail and will make her aware that only other option for COVID patient is home health.   9:39am-CSW received return call from patient's daughter. She stated she is going to [facility] today before making a decision. CSW answered questions regarding home health and DME. She will contact CSW in a few hours.  1pm-CSW received call from daughter stating that she is looking into [facility] after making calls. CSW explained that [facility] is not accepting COVID patients and that [facility] is an ALF private pay, not SNF and CSW would be unable to make that transfer happen by the time patient discharges. CSW explained that if patient goes home with home health She stated she is going to tour and will call CSW back.  2pm-CSW received call from patient's daughter. She asked if patient would be able to discharge home with her and then go to SNF after his COVID quarantine. CSW explained that Medicare allows patients 30 days after hospital discharge to get to a SNF, but that it is riskier to try to do with home health. She reported understanding and is going to [facility] still.    2:58pm-CSW received call from daughter. She stated they are going to go the private pay route and [facility] can assess patient on Monday. CSW asked what the discharge plan would be until then and she stated home with her (without home health because they likely would not even visit patient between discharge and then). CSW sent updated ALF FL2 to [facility] (f. [phone]) to help facilitate  process.  3:38pm-Patient's daughter contacted CSW and stated she would appeal discharge if patient is discharged tomorrow. CSW explained that is patient's Medicare right and that we would see what the physician says tomorrow about patient's condition. She then asked CSW to have someone from [facility]) Inpatient rehab come assess patient for their program. CSW spoke with Tammy, liaison with [facility] IR, and she stated she will review referral, though an in person assessment would not be completed.      Expected Discharge Plan: [place] Barriers to Discharge: Continued Medical Work up  Expected Discharge Plan and Services Expected Discharge Plan: [place] In-house Referral: Clinical Social Work   Post Acute Care Choice: [place] Living arrangements for the past 2 months: Single Family Home                                       Social Determinants of Health (SDOH) Interventions    Readmission Risk Interventions No flowsheet data found.

## 2021-05-15 NOTE — Evaluation (Signed)
Occupational Therapy Evaluation Patient Details Name: [name] MRN: [id number] DOB: 1929-01-19 Today's Date: 05/15/2021    History of Present Illness 87yo male admitted 05/12/21 c/o recent fall. Found to be covid positive. PMH HLD, HTN, CAD, CABG   Clinical Impression   Pt was living alone, driving and ambulating with a RW prior to admission. He still mows his lawn with a riding mower. Pt is HOH and demonstrates decreased safety awareness. He was able to perform bed mobility with supervision and ambulate to sink with min guard assist. Recommending SNF for rehab prior to returning home, pt will need 24 hour supervision.    Follow Up Recommendations  SNF;Supervision/Assistance - 24 hour    Equipment Recommendations  None recommended by OT    Recommendations for Other Services       Precautions / Restrictions Precautions Precautions: Fall Precaution Comments: HOH, Covid +      Mobility Bed Mobility Overal bed mobility: Needs Assistance Bed Mobility: Supine to Sit     Supine to sit: HOB elevated;Supervision     General bed mobility comments: supervised for lines, no physical assist    Transfers Overall transfer level: Needs assistance Equipment used: Rolling walker (2 wheeled) Transfers: Sit to/from Stand Sit to Stand: Min guard         General transfer comment: no physical assist, min guard for safety and lines    Balance Overall balance assessment: History of Falls;Needs assistance   Sitting balance-Leahy Scale: Fair     Standing balance support: Bilateral upper extremity supported Standing balance-Leahy Scale: Poor Standing balance comment: reliant on B UE support                           ADL either performed or assessed with clinical judgement   ADL Overall ADL's : Needs assistance/impaired Eating/Feeding: Independent;Bed level   Grooming: Min guard;Standing;Wash/dry hands   Upper Body Bathing: Set up;Sitting   Lower Body Bathing:  Min guard;Sit to/from stand   Upper Body Dressing : Set up;Sitting   Lower Body Dressing: Min guard;Sit to/from stand   Toilet Transfer: Min guard;Ambulation;RW   Toileting- Clothing Manipulation and Hygiene: Minimal assistance;Sit to/from stand       Functional mobility during ADLs: Min guard;Rolling walker       Vision Baseline Vision/History: Wears glasses Wears Glasses: Reading only Patient Visual Report: No change from baseline       Perception     Praxis      Pertinent Vitals/Pain Pain Assessment: No/denies pain     Hand Dominance Right   Extremity/Trunk Assessment Upper Extremity Assessment Upper Extremity Assessment: Overall WFL for tasks assessed   Lower Extremity Assessment Lower Extremity Assessment: Defer to PT evaluation   Cervical / Trunk Assessment Cervical / Trunk Assessment: Kyphotic   Communication Communication Communication: Deaf   Cognition Arousal/Alertness: Awake/alert Behavior During Therapy: WFL for tasks assessed/performed;Impulsive Overall Cognitive Status: Impaired/Different from baseline Area of Impairment: Safety/judgement                         Safety/Judgement: Decreased awareness of safety;Decreased awareness of deficits         General Comments       Exercises     Shoulder Instructions      Home Living Family/patient expects to be discharged to:: Private residence Living Arrangements: Alone Available Help at Discharge: Family;Available PRN/intermittently (lives 50 miles away) Type of Home: House Home Access: Stairs  to enter Entrance Stairs-Number of Steps: 3 with B rails Entrance Stairs-Rails: Right;Left;Can reach both Home Layout: One level               Home Equipment: Walker - 2 wheels;Shower seat - built in;Grab bars - toilet;Grab bars - tub/shower   Additional Comments: pt drives, was in a restaurant when he fell      Prior Functioning/Environment Level of Independence: Independent  with assistive device(s)                 OT Problem List: Impaired balance (sitting and/or standing);Decreased safety awareness      OT Treatment/Interventions: Self-care/ADL training;DME and/or AE instruction;Patient/family education;Balance training;Therapeutic activities    OT Goals(Current goals can be found in the care plan section) Acute Rehab OT Goals Patient Stated Goal: return home OT Goal Formulation: With patient Time For Goal Achievement: 05/29/21 Potential to Achieve Goals: Good  OT Frequency: Min 2X/week   Barriers to D/C: Decreased caregiver support          Co-evaluation              AM-PAC OT "6 Clicks" Daily Activity     Outcome Measure Help from another person eating meals?: None Help from another person taking care of personal grooming?: A Little Help from another person toileting, which includes using toliet, bedpan, or urinal?: A Little Help from another person bathing (including washing, rinsing, drying)?: A Little Help from another person to put on and taking off regular upper body clothing?: None Help from another person to put on and taking off regular lower body clothing?: A Little 6 Click Score: 20   End of Session Equipment Utilized During Treatment: Rolling walker;Gait belt  Activity Tolerance: Patient tolerated treatment well Patient left: in chair;with call bell/phone within reach;with chair alarm set  OT Visit Diagnosis: Other abnormalities of gait and mobility (R26.89);Unsteadiness on feet (R26.81);Other symptoms and signs involving cognitive function                Time: 7048-8891 OT Time Calculation (min): 24 min Charges:  OT General Charges $OT Visit: 1 Visit OT Evaluation $OT Eval Moderate Complexity: 1 Mod OT Treatments $Self Care/Home Management : 8-22 mins  Nestor Lewandowsky, OTR/L Acute Rehabilitation Services Pager: [phone] Office: [phone]   Malka So 05/15/2021, 2:56 PM

## 2021-05-16 ENCOUNTER — Inpatient Hospital Stay (HOSPITAL_COMMUNITY): Payer: Medicare Other

## 2021-05-16 DIAGNOSIS — R609 Edema, unspecified: Secondary | ICD-10-CM

## 2021-05-16 DIAGNOSIS — M7989 Other specified soft tissue disorders: Secondary | ICD-10-CM

## 2021-05-16 DIAGNOSIS — R7989 Other specified abnormal findings of blood chemistry: Secondary | ICD-10-CM

## 2021-05-16 LAB — CBC WITH DIFFERENTIAL/PLATELET
Abs Immature Granulocytes: 0.03 10*3/uL (ref 0.00–0.07)
Basophils Absolute: 0 10*3/uL (ref 0.0–0.1)
Basophils Relative: 0 %
Eosinophils Absolute: 0 10*3/uL (ref 0.0–0.5)
Eosinophils Relative: 0 %
HCT: 30.3 % — ABNORMAL LOW (ref 39.0–52.0)
Hemoglobin: 9.8 g/dL — ABNORMAL LOW (ref 13.0–17.0)
Immature Granulocytes: 0 %
Lymphocytes Relative: 20 %
Lymphs Abs: 1.6 10*3/uL (ref 0.7–4.0)
MCH: 26.3 pg (ref 26.0–34.0)
MCHC: 32.3 g/dL (ref 30.0–36.0)
MCV: 81.2 fL (ref 80.0–100.0)
Monocytes Absolute: 0.5 10*3/uL (ref 0.1–1.0)
Monocytes Relative: 7 %
Neutro Abs: 5.7 10*3/uL (ref 1.7–7.7)
Neutrophils Relative %: 73 %
Platelets: 179 10*3/uL (ref 150–400)
RBC: 3.73 MIL/uL — ABNORMAL LOW (ref 4.22–5.81)
RDW: 18.5 % — ABNORMAL HIGH (ref 11.5–15.5)
WBC: 7.9 10*3/uL (ref 4.0–10.5)
nRBC: 0 % (ref 0.0–0.2)

## 2021-05-16 LAB — BRAIN NATRIURETIC PEPTIDE: B Natriuretic Peptide: 748 pg/mL — ABNORMAL HIGH (ref 0.0–100.0)

## 2021-05-16 LAB — COMPREHENSIVE METABOLIC PANEL
ALT: 25 U/L (ref 0–44)
AST: 45 U/L — ABNORMAL HIGH (ref 15–41)
Albumin: 2.8 g/dL — ABNORMAL LOW (ref 3.5–5.0)
Alkaline Phosphatase: 32 U/L — ABNORMAL LOW (ref 38–126)
Anion gap: 6 (ref 5–15)
BUN: 42 mg/dL — ABNORMAL HIGH (ref 8–23)
CO2: 23 mmol/L (ref 22–32)
Calcium: 8.5 mg/dL — ABNORMAL LOW (ref 8.9–10.3)
Chloride: 107 mmol/L (ref 98–111)
Creatinine, Ser: 1.14 mg/dL (ref 0.61–1.24)
GFR, Estimated: >60 mL/min (ref >60)
Glucose, Bld: 125 mg/dL — ABNORMAL HIGH (ref 70–99)
Potassium: 4.5 mmol/L (ref 3.5–5.1)
Sodium: 136 mmol/L (ref 135–145)
Total Bilirubin: 0.6 mg/dL (ref 0.3–1.2)
Total Protein: 6 g/dL — ABNORMAL LOW (ref 6.5–8.1)

## 2021-05-16 LAB — ECHOCARDIOGRAM COMPLETE
AR max vel: 2.02 cm2
AV Area VTI: 2.2 cm2
AV Area mean vel: 1.82 cm2
AV Mean grad: 4 mmHg
AV Peak grad: 8.3 mmHg
Ao pk vel: 1.44 m/s
Area-P 1/2: 4.49 cm2
Height: 65 in
S' Lateral: 2.1 cm
Weight: 2880 oz

## 2021-05-16 LAB — D-DIMER, QUANTITATIVE: D-Dimer, Quant: 0.85 ug/mL-FEU — ABNORMAL HIGH (ref 0.00–0.50)

## 2021-05-16 LAB — MAGNESIUM: Magnesium: 1.8 mg/dL (ref 1.7–2.4)

## 2021-05-16 LAB — C-REACTIVE PROTEIN: CRP: 0.6 mg/dL (ref <1.0)

## 2021-05-16 MED ORDER — METOPROLOL TARTRATE 50 MG PO TABS: 50.0000 mg | ORAL_TABLET | Freq: Two times a day (BID) | ORAL | Status: DC

## 2021-05-16 MED ORDER — APIXABAN 5 MG PO TABS: 5.0000 mg | ORAL_TABLET | Freq: Two times a day (BID) | ORAL | Status: DC

## 2021-05-16 NOTE — Care Management Important Message (Signed)
Important Message  Patient Details  Name: [name] MRN: [id number] Date of Birth: [birth year]   Medicare Important Message Given:     [facility]ith[facility] [employer] Appeal Detailed Notice of Discharge letter created and saved: Yes Detailed Notice of Discharge Document Given to [employer]: Yes [employer] Document Created: Yes [employer] appeal documents uploaded to [employer] stite: Yes   [place] P Kennett Symes 05/16/2021, 2:07 PM

## 2021-05-16 NOTE — Discharge Instructions (Signed)
Follow with Primary MD [name], MD in 7 days   Get CBC, CMP, 2 view Chest X ray -  checked next visit within 1 week by Primary MD or SNF MD    Activity: As tolerated with Full fall precautions use walker/cane & assistance as needed  Disposition SNF  Diet: Heart Healthy    Special Instructions: If you have smoked or chewed Tobacco  in the last 2 yrs please stop smoking, stop any regular Alcohol  and or any Recreational drug use.  On your next visit with your primary care physician please Get Medicines reviewed and adjusted.  Please request your Prim.MD to go over all Hospital Tests and Procedure/Radiological results at the follow up, please get all Hospital records sent to your Prim MD by signing hospital release before you go home.  If you experience worsening of your admission symptoms, develop shortness of breath, life threatening emergency, suicidal or homicidal thoughts you must seek medical attention immediately by calling 911 or calling your MD immediately  if symptoms less severe.  You Must read complete instructions/literature along with all the possible adverse reactions/side effects for all the Medicines you take and that have been prescribed to you. Take any new Medicines after you have completely understood and accpet all the possible adverse reactions/side effects.

## 2021-05-16 NOTE — TOC Transition Note (Signed)
Transition of Care [facility]) - CM/SW Discharge Note   Patient Details  Name: [name] MRN: [id number] Date of Birth: December 08, 1928  [facility] CM/SW Contact:  Ninfa Meeker, RN Phone Number: 05/16/2021, 2:33 PM   Clinical Narrative: Patient's daughter, donna [place] had filled appeal with [employer] this morning. As of this entry, CM notified  that she has cancelled appeal and will accept bed for her dad at [facility].     Final next level of care: Skilled Nursing Facility Barriers to Discharge: Continued Medical Work up   Patient Goals and CMS Choice Patient states their goals for this hospitalization and ongoing recovery are:: Rehab CMS Medicare.gov Compare Post Acute Care list provided to:: Patient Represenative (must comment) Choice offered to / list presented to : Patient, Adult Children  Discharge Placement                       Discharge Plan and Services In-house Referral: Clinical Social Work   Post Acute Care Choice: [place]                               Social Determinants of Health (SDOH) Interventions     Readmission Risk Interventions No flowsheet data found.

## 2021-05-16 NOTE — Discharge Summary (Signed)
[name]:[id number] DOB: 1929-04-26 DOA: 05/12/2021  PCP: [name], MD  Admit date: 05/12/2021  Discharge date: 05/16/2021  Admitted From: Home   Disposition:  SNF   Recommendations for Outpatient Follow-up:   Follow up with PCP in 1-2 weeks  PCP Please obtain BMP/CBC, 2 view CXR in 1week,  (see Discharge instructions)   PCP Please follow up on the following pending results:    Home Health: None   Equipment/Devices: None  Consultations: Cards Discharge Condition: Stable    CODE STATUS: Full    Diet Recommendation: Heart Healthy   Diet Order             Diet - low sodium heart healthy           Diet Heart Room service appropriate? Yes; Fluid consistency: Thin  Diet effective now                    Chief Complaint  Patient presents with   Fall     Brief history of present illness from the day of admission and additional interim summary    [name] is a 87 y.o. male with medical history significant of hypertension, hyperlipidemia, CAD s/p CABG, and gait disturbance (walks with a walker) who presents after having 2 falls yesterday, per wife 3-4 falls in the last 1 yr, has had 3 shots of COVID-vaccine so far, in the ER he was diagnosed with incidental COVID-19 infection, dehydration and AKI, admitted for further care.                                                                 Hospital Course    Acute Covid 19 Viral  Infection  - he has received 3 shots of COVID-19 vaccine, he has COVID-related systemic symptoms causing dehydration and AKI, CRP is mildly elevated and D-dimer is moderately elevated, he is being treated with IV fluids for dehydration and AKI, low-dose IV steroids and 3 doses of remdesivir.  negative lower extremity venous duplex .    2.  Dehydration with AKI.   Hydrated with IV fluids and now resolved.   3.  New onset A. fib.  Currently in RVR, [place] vas 2 score of greater than 3.  He has had 3-4 falls over the last 1 year but still has decent quality of life, discussed risks and benefits with wife and patient, they would like to consider anticoagulation and avoid ischemic stroke if possible.  He had stable TSH & TTE, was placed on beta-blocker and Eliquis, seen by cardiology, currently in good rate control, after discharge follow-up with primary cardiologist in 1 to 2 weeks.     4.  CAD s/p CABG in 2019.  No acute issues follows with Dr. Johnsie Cancel, echo stable, no acute symptoms,  he was seen by cardiology here, currently on beta-blocker along with statin for secondary prevention which will be continued.  Note Plavix had been stopped as he is on Eliquis now.   5.  Poor balance with 3-4 falls over the last 1 year.  PT OT, and will require SNF.   6.  Mild thrombocytopenia could be due to viral illness.  Improving with supportive care.   7.  Dyslipidemia.  On statin.   8.  Essential hypertension.  Blood pressure stable on present dose beta-blocker.     Discharge diagnosis     Principal Problem:   COVID-19 virus infection Active Problems:   Essential hypertension   CAD (coronary artery disease)   Multifactorial gait disorder   Weakness   Frequent falls   Normocytic anemia   AKI (acute kidney injury) ([place])    Discharge instructions    Discharge Instructions     Diet - low sodium heart healthy   Complete by: As directed    Discharge instructions   Complete by: As directed    Follow with Primary MD [name], MD in 7 days   Get CBC, CMP, 2 view Chest X ray -  checked next visit within 1 week by Primary MD or SNF MD    Activity: As tolerated with Full fall precautions use walker/cane & assistance as needed  Disposition SNF  Diet: Heart Healthy    Special Instructions: If you have smoked or chewed Tobacco  in the last 2 yrs  please stop smoking, stop any regular Alcohol  and or any Recreational drug use.  On your next visit with your primary care physician please Get Medicines reviewed and adjusted.  Please request your Prim.MD to go over all Hospital Tests and Procedure/Radiological results at the follow up, please get all Hospital records sent to your Prim MD by signing hospital release before you go home.  If you experience worsening of your admission symptoms, develop shortness of breath, life threatening emergency, suicidal or homicidal thoughts you must seek medical attention immediately by calling 911 or calling your MD immediately  if symptoms less severe.  You Must read complete instructions/literature along with all the possible adverse reactions/side effects for all the Medicines you take and that have been prescribed to you. Take any new Medicines after you have completely understood and accpet all the possible adverse reactions/side effects.   Discharge wound care:   Complete by: As directed    L knee abrasion keep clean and dry   Increase activity slowly   Complete by: As directed        Discharge Medications   Allergies as of 05/16/2021       Reactions   Succinylsulphathiazole Rash   Other Other (See Comments)   Sulfonamide Derivatives Itching, Rash        Medication List     STOP taking these medications    clopidogrel 75 MG tablet Commonly known as: PLAVIX   ramipril 5 MG capsule Commonly known as: ALTACE       TAKE these medications    apixaban 5 MG Tabs tablet Commonly known as: ELIQUIS Take 1 tablet (5 mg total) by mouth 2 (two) times daily.   atorvastatin 20 MG tablet Commonly known as: LIPITOR Take 20 mg by mouth daily.   B-complex with vitamin C tablet Take 1 tablet by mouth daily.   denosumab 60 MG/ML Soln injection Commonly known as: PROLIA Inject 60 mg into the skin every 6 (six) months. Administer  in upper arm, thigh, or abdomen   latanoprost 0.005 %  ophthalmic solution Commonly known as: XALATAN Place 1 drop into the left eye daily.   metoprolol tartrate 50 MG tablet Commonly known as: LOPRESSOR Take 1 tablet (50 mg total) by mouth 2 (two) times daily.   NEXIUM PO Take 1 tablet by mouth every other day.   vitamin C 500 MG tablet Commonly known as: ASCORBIC ACID Take 500 mg by mouth daily.               Discharge Care Instructions  (From admission, onward)           Start     Ordered   05/16/21 0000  Discharge wound care:       Comments: L knee abrasion keep clean and dry   05/16/21 3716             Follow-up Information     Josue Hector, MD Follow up.   Specialty: Cardiology Why: Please keep follow-up visit with Dr. Johnsie Cancel on 07/02/2021 at 4:00pm which you already have scheduled. Contact information: [address] [place] [address] [phone]         [name], MD. Schedule an appointment as soon as possible for a visit in 1 week(s).   Specialty: Family Medicine Contact information: [address] [place] [address] [phone]         Josue Hector, MD .   Specialty: Cardiology Contact information: [phone] [place] [address] [phone]                 Major procedures and Radiology Reports - PLEASE review detailed and final reports thoroughly  -       DG Chest 2 View  Result Date: 05/13/2021 CLINICAL DATA:  Fall EXAM: CHEST - 2 VIEW COMPARISON:  07/26/2009 FINDINGS: Lungs are clear.  No pleural effusion or pneumothorax. The heart is normal in size. Postsurgical changes related to prior CABG. IMPRESSION: No evidence of acute cardiopulmonary disease. Electronically Signed   By: Julian Hy M.D.   On: 05/13/2021 01:09   DG Chest Port 1 View  Result Date: 05/14/2021 CLINICAL DATA:  Shortness of breath, COVID positive by report. History of heart surgery. EXAM: PORTABLE CHEST 1 VIEW  COMPARISON:  May 13, 2021. FINDINGS: EKG leads project over the chest. Post median sternotomy for CABG. Signs of LIMA grafting. Cardiomediastinal contours and hilar structures are stable. Trachea is in the midline accounting for mild rotation and mild buckling due to low lung volumes. Signs of aortic atherosclerosis. Hiatal hernia projects posterior to the heart as on previous imaging. No sign of lobar consolidative changes. Vascular crowding without frank edema mild asymmetry of interstitial markings on the LEFT as compared to the RIGHT in the upper chest. No sign of effusion. No pneumothorax. On limited assessment no acute skeletal process. Signs of prior trauma to LEFT-sided ribs. Signs of cement augmentation lumbar and thoracic spine not well evaluated. IMPRESSION: 1. Mild pulmonary vascular congestion and or vascular crowding. Question of mild increased interstitial markings on the LEFT could reflect mild pneumonitis or early asymmetric pulmonary edema. 2. Hiatal hernia. 3. Post CABG. 4. Signs of aortic atherosclerosis. Aortic Atherosclerosis (ICD10-I70.0). Electronically Signed   By: Zetta Bills M.D.   On: 05/14/2021 08:33   DG Knee Complete 4 Views Left  Result Date: 05/13/2021 CLINICAL DATA:  Fall EXAM: LEFT KNEE - COMPLETE 4+ VIEW COMPARISON:  None. FINDINGS: No fracture or dislocation is seen. Mild degenerative changes with sharpening of the tibial spines, patellofemoral joint space narrowing with small osteophytes, and lateral compartment chondrocalcinosis. Visualized soft tissues are within normal limits. No suprapatellar knee joint effusion. IMPRESSION: No fracture or dislocation is seen. Mild degenerative changes, as above. Electronically Signed   By: Julian Hy M.D.   On: 05/13/2021 01:10   DG Knee Complete 4 Views Right  Result Date: 05/13/2021 CLINICAL DATA:  Fall EXAM: RIGHT KNEE - COMPLETE 4+ VIEW COMPARISON:  None. FINDINGS: No fracture or dislocation is seen. Mild lateral  compartment chondrocalcinosis. Joint spaces are essentially preserved. Mild prepatellar soft tissue swelling. IMPRESSION: Mild prepatellar soft tissue swelling. Electronically Signed   By: Julian Hy M.D.   On: 05/13/2021 01:11   ECHOCARDIOGRAM COMPLETE  Result Date: 05/16/2021    ECHOCARDIOGRAM LIMITED REPORT   Patient Name:   TEDDIE MEHTA Date of Exam: 05/14/2021 Medical Rec #:  [id number]        Height:       65.0 in Accession #:    [id number]       Weight:       180.0 lb Date of Birth:  Mar 16, 1929         BSA:          1.892 m Patient Age:    87 years         BP:           104/65 mmHg Patient Gender: M                HR:           71 bpm. Exam Location:  Inpatient Procedure: 2D Echo, Cardiac Doppler and Color Doppler Indications:     Elevated troponin  History:         Patient has prior history of Echocardiogram examinations, most                  recent 11/18/2011. CAD; Risk Factors:Hypertension and                  Dyslipidemia.  Sonographer:     Cammy Brochure Referring Phys:  Arnell Asal Margaree [facility] Diagnosing Phys: Buford Dresser MD IMPRESSIONS  1. Left ventricular ejection fraction, by estimation, is 50 to 55%. The left ventricle has low normal function. The left ventricle has no regional wall motion abnormalities. There is moderate asymmetric left ventricular hypertrophy of the basal-septal segment. Left ventricular diastolic function could not be evaluated.  2. Right ventricular systolic function is normal. The right ventricular size is normal. Tricuspid regurgitation signal is inadequate for assessing PA pressure.  3. Left atrial size was moderately dilated.  4. The mitral valve is grossly normal. Trivial mitral valve regurgitation. No evidence of mitral stenosis.  5. The aortic valve is tricuspid. There is mild calcification of the aortic valve. Aortic valve regurgitation is not visualized. Mild aortic valve sclerosis is present, with no evidence of aortic valve stenosis. Comparison(s):  No significant change from prior study. FINDINGS  Left Ventricle: Left ventricular ejection fraction, by estimation, is 50 to 55%. The left ventricle has low normal function. The left ventricle has no regional wall motion abnormalities. There is moderate asymmetric left ventricular hypertrophy of the basal-septal segment. Left ventricular diastolic function could not be evaluated. Left ventricular diastolic function could not be evaluated due to atrial fibrillation. Right Ventricle: The right ventricular size is normal. Right vetricular wall thickness was not well  visualized. Right ventricular systolic function is normal. Tricuspid regurgitation signal is inadequate for assessing PA pressure. Left Atrium: Left atrial size was moderately dilated. Right Atrium: Right atrial size was normal in size. Pericardium: Trivial pericardial effusion is present. Mitral Valve: The mitral valve is grossly normal. Mild to moderate mitral annular calcification. Trivial mitral valve regurgitation. No evidence of mitral valve stenosis. Tricuspid Valve: The tricuspid valve is normal in structure. Tricuspid valve regurgitation is trivial. No evidence of tricuspid stenosis. Aortic Valve: The aortic valve is tricuspid. There is mild calcification of the aortic valve. Aortic valve regurgitation is not visualized. Mild aortic valve sclerosis is present, with no evidence of aortic valve stenosis. Aortic valve mean gradient measures 4.0 mmHg. Aortic valve peak gradient measures 8.3 mmHg. Aortic valve area, by VTI measures 2.20 cm. Pulmonic Valve: The pulmonic valve was grossly normal. Pulmonic valve regurgitation is mild. Aorta: The aortic root, ascending aorta and aortic arch are all structurally normal, with no evidence of dilitation or obstruction. Venous: The inferior vena cava was not well visualized. IAS/Shunts: The interatrial septum was not well visualized. LEFT VENTRICLE PLAX 2D LVIDd:         2.80 cm LVIDs:         2.10 cm LV PW:          1.30 cm LV IVS:        2.00 cm LVOT diam:     2.10 cm LV SV:         63 LV SV Index:   34 LVOT Area:     3.46 cm  LEFT ATRIUM             Index LA diam:        3.30 cm 1.74 cm/m LA Vol (A2C):   74.3 ml 39.28 ml/m LA Vol (A4C):   82.4 ml 43.56 ml/m LA Biplane Vol: 82.8 ml 43.77 ml/m  AORTIC VALVE AV Area (Vmax):    2.02 cm AV Area (Vmean):   1.82 cm AV Area (VTI):     2.20 cm AV Vmax:           144.00 cm/s AV Vmean:          95.500 cm/s AV VTI:            0.288 m AV Peak Grad:      8.3 mmHg AV Mean Grad:      4.0 mmHg LVOT Vmax:         84.10 cm/s LVOT Vmean:        50.200 cm/s LVOT VTI:          0.183 m LVOT/AV VTI ratio: 0.64  AORTA Ao Root diam: 3.70 cm Ao Asc diam:  3.10 cm MITRAL VALVE MV Area (PHT): 4.49 cm     SHUNTS MV Decel Time: 169 msec     Systemic VTI:  0.18 m MV E velocity: 126.00 cm/s  Systemic Diam: 2.10 cm MV A velocity: 29.90 cm/s MV E/A ratio:  4.21 Buford Dresser MD Electronically signed by Buford Dresser MD Signature Date/Time: 05/14/2021/5:51:07 PM    Final (Updated)    DG HIP UNILAT W OR W/O PELVIS 2-3 VIEWS RIGHT  Result Date: 05/13/2021 CLINICAL DATA:  Fall EXAM: DG HIP (WITH OR WITHOUT PELVIS) 2-3V RIGHT COMPARISON:  None. FINDINGS: No fracture or dislocation is seen. Bilateral hip joint spaces are preserved. Visualized bony pelvis appears intact. Degenerative changes of the lower lumbar spine with prior vertebral augmentation. Vascular calcifications. IMPRESSION: Negative. Electronically Signed  By: Julian Hy M.D.   On: 05/13/2021 01:10    Micro Results     Recent Results (from the past 240 hour(s))  Resp Panel by RT-PCR (Flu A&B, Covid) Nasopharyngeal Swab     Status: Abnormal   Collection Time: 05/13/21  1:32 AM   Specimen: Nasopharyngeal Swab; Nasopharyngeal(NP) swabs in vial transport medium  Result Value Ref Range Status   SARS Coronavirus 2 by RT PCR POSITIVE (A) NEGATIVE Final    Comment: RESULT CALLED TO, READ BACK BY AND VERIFIED  WITH: POWELL A, RN 05/13/21 @ 0327 BY AV (NOTE) SARS-CoV-2 target nucleic acids are DETECTED.  The SARS-CoV-2 RNA is generally detectable in upper respiratory specimens during the acute phase of infection. Positive results are indicative of the presence of the identified virus, but do not rule out bacterial infection or co-infection with other pathogens not detected by the test. Clinical correlation with patient history and other diagnostic information is necessary to determine patient infection status. The expected result is Negative.  Fact Sheet for Patients: EntrepreneurPulse.com.au  Fact Sheet for Healthcare Providers: IncredibleEmployment.be  This test is not yet approved or cleared by the [place] FDA and  has been authorized for detection and/or diagnosis of SARS-CoV-2 by FDA under an Emergency Use Authorization (EUA).  This EUA will remain in effect (meaning this test can be  used) for the duration of  the COVID-19 declaration under Section 564(b)(1) of the Act, 21 U.S.C. section 360bbb-3(b)(1), unless the authorization is terminated or revoked sooner.     Influenza A by PCR NEGATIVE NEGATIVE Final   Influenza B by PCR NEGATIVE NEGATIVE Final    Comment: (NOTE) The Xpert Xpress SARS-CoV-2/FLU/RSV plus assay is intended as an aid in the diagnosis of influenza from Nasopharyngeal swab specimens and should not be used as a sole basis for treatment. Nasal washings and aspirates are unacceptable for Xpert Xpress SARS-CoV-2/FLU/RSV testing.  Fact Sheet for Patients: EntrepreneurPulse.com.au  Fact Sheet for Healthcare Providers: IncredibleEmployment.be  This test is not yet approved or cleared by the [place] FDA and has been authorized for detection and/or diagnosis of SARS-CoV-2 by FDA under an Emergency Use Authorization (EUA). This EUA will remain in effect (meaning this test can be used)  for the duration of the COVID-19 declaration under Section 564(b)(1) of the Act, 21 U.S.C. section 360bbb-3(b)(1), unless the authorization is terminated or revoked.  Performed at [facility], [address], [place], [place] [address]     Today   Subjective    Ceylon Arenson today has no headache,no chest abdominal pain,no new weakness tingling or numbness, feels much better wants to go home today.      Objective   Blood pressure 134/73, pulse 75, temperature 97.6 F (36.4 C), temperature source Oral, resp. rate 17, height 5\' 5"  (1.651 m), weight 81.6 kg, SpO2 95 %.   Intake/Output Summary (Last 24 hours) at 05/16/2021 0909 Last data filed at 05/16/2021 0741 Gross per 24 hour  Intake 476 ml  Output 1800 ml  Net -1324 ml    Exam  Awake Alert, No new F.N deficits, Normal affect [place].AT,PERRAL Supple Neck,No JVD, No cervical lymphadenopathy appriciated.  Symmetrical Chest wall movement, Good air movement bilaterally, CTAB RRR,No Gallops,Rubs or new Murmurs, No Parasternal Heave +ve B.Sounds, Abd Soft, Non tender, No organomegaly appriciated, No rebound -guarding or rigidity. No Cyanosis, Clubbing or edema, No new Rash or bruise   Data Review   CBC w Diff:  Lab Results  Component Value  Date   WBC 7.9 05/16/2021   HGB 9.8 (L) 05/16/2021   HGB 13.4 05/24/2018   HCT 30.3 (L) 05/16/2021   HCT 39.6 05/24/2018   PLT 179 05/16/2021   PLT 179 05/24/2018   LYMPHOPCT 20 05/16/2021   MONOPCT 7 05/16/2021   EOSPCT 0 05/16/2021   BASOPCT 0 05/16/2021    CMP:  Lab Results  Component Value Date   NA 136 05/16/2021   NA 139 05/24/2018   K 4.5 05/16/2021   CL 107 05/16/2021   CO2 23 05/16/2021   BUN 42 (H) 05/16/2021   BUN 25 05/24/2018   CREATININE 1.14 05/16/2021   PROT 6.0 (L) 05/16/2021   ALBUMIN 2.8 (L) 05/16/2021   BILITOT 0.6 05/16/2021   ALKPHOS 32 (L) 05/16/2021   AST 45 (H) 05/16/2021   ALT 25 05/16/2021  .   Total Time in preparing  paper work, data evaluation and todays exam - 63 minutes  Lala Lund M.D on 05/16/2021 at 9:09 AM  [facility]

## 2021-05-16 NOTE — TOC Transition Note (Signed)
Transition of Care [facility]) - CM/SW Discharge Note   Patient Details  Name: [name] MRN: [id number] Date of Birth: [birth year]  Transition of Care [facility]) CM/SW Contact:  Ninfa Meeker, RN Phone Number: 05/16/2021, 9:34 AM   Clinical Narrative:   Case manager spoke with patient's daughter, [name] [phone], she states that she is filing appeal for her dad's discharge . Case Manager reviewed [place] Detailed Notice of discharge with her. TOC team will continue to follow.    Final next level of care: Skilled Nursing Facility Barriers to Discharge: Continued Medical Work up   Patient Goals and CMS Choice Patient states their goals for this hospitalization and ongoing recovery are:: Rehab CMS Medicare.gov Compare Post Acute Care list provided to:: Patient Represenative (must comment) Choice offered to / list presented to : Patient, Adult Children  Discharge Placement                       Discharge Plan and Services In-house Referral: Clinical Social Work   Post Acute Care Choice: [place]                               Social Determinants of Health (SDOH) Interventions     Readmission Risk Interventions No flowsheet data found.

## 2021-05-16 NOTE — TOC Transition Note (Signed)
Transition of Care [facility]) - CM/SW Discharge Note   Patient Details  Name: [name] MRN: [id number] Date of Birth: [birth year]  Transition of Care [facility]) CM/SW Contact:  Benard Halsted, LCSW Phone Number: 05/16/2021, 2:47 PM   Clinical Narrative:    Patient will DC to: [facility] Anticipated DC date: 05/16/21 Family notified: Daughter, Engineering geologist by: Corey Harold   Per MD patient ready for DC to [facility] IR. RN to call report prior to discharge [phone]). RN, patient, patient's family, and facility notified of DC. Discharge Summary sent to facility. DC packet on chart. Ambulance transport requested for patient.   CSW will sign off for now as social work intervention is no longer needed. Please consult [place] again if new needs arise.     Final next level of care: IP Rehab Facility Barriers to Discharge: Barriers Resolved   Patient Goals and CMS Choice Patient states their goals for this hospitalization and ongoing recovery are:: Rehab CMS Medicare.gov Compare Post Acute Care list provided to:: Patient Represenative (must comment) Choice offered to / list presented to : Patient, Adult Children  Discharge Placement                Patient to be transferred to facility by: [place] Name of family member notified: Daughter Patient and family notified of of transfer: 05/16/21  Discharge Plan and Services In-house Referral: Clinical Social Work   Post Acute Care Choice: [place]                               Social Determinants of Health (SDOH) Interventions     Readmission Risk Interventions No flowsheet data found.

## 2021-05-16 NOTE — Progress Notes (Signed)
Physical Therapy Treatment Patient Details Name: [name] MRN: [id number] DOB: Aug 29, 1929 Today's Date: 05/16/2021    History of Present Illness 87yo male admitted 05/12/21 c/o recent fall. Found to be covid positive. PMH HLD, HTN, CAD, CABG    PT Comments    Patient received in bed, pleasant but still a bit HOH- but was cooperative and followed cues well. Able to mobilize on a min guard to MinA basis with RW today, but did need help for dynamic balance even with assistive device, especially when navigating turns. Has very poor safety awareness and needed max cues for hand placement/safety/sequencing today to prevent a fall. VSS on RA. Left up in recliner with all needs met, chair alarm active. Continue to recommend SNF.    Follow Up Recommendations  SNF;Supervision/Assistance - 24 hour     Equipment Recommendations  Rolling walker with 5" wheels;3in1 (PT);Wheelchair (measurements PT);Wheelchair cushion (measurements PT)    Recommendations for Other Services       Precautions / Restrictions Precautions Precautions: Fall Precaution Comments: HOH, Covid + Restrictions Weight Bearing Restrictions: No    Mobility  Bed Mobility Overal bed mobility: Needs Assistance Bed Mobility: Supine to Sit     Supine to sit: HOB elevated;Supervision     General bed mobility comments: supervised for lines, no physical assist    Transfers Overall transfer level: Needs assistance Equipment used: Rolling walker (2 wheeled) Transfers: Sit to/from Stand Sit to Stand: Min guard         General transfer comment: no physical assist, min guard for safety and lines, heavy cues for hand placement and safety  Ambulation/Gait Ambulation/Gait assistance: Min assist Gait Distance (Feet): 80 Feet Assistive device: Rolling walker (2 wheeled) Gait Pattern/deviations: Step-through pattern;Trunk flexed;Narrow base of support;Drifts right/left Gait velocity: decreased   General Gait Details:  did well with gait progression today, VSS on RA but did need minA for balance and safe use of device especially when turning. Grossly unsteady   Stairs             Wheelchair Mobility    Modified Rankin (Stroke Patients Only)       Balance Overall balance assessment: History of Falls;Needs assistance Sitting-balance support: Bilateral upper extremity supported;Feet supported Sitting balance-Leahy Scale: Fair Sitting balance - Comments: close S for safety   Standing balance support: Bilateral upper extremity supported Standing balance-Leahy Scale: Poor Standing balance comment: reliant on B UE support                            Cognition Arousal/Alertness: Awake/alert Behavior During Therapy: WFL for tasks assessed/performed;Impulsive Overall Cognitive Status: Impaired/Different from baseline Area of Impairment: Safety/judgement                         Safety/Judgement: Decreased awareness of safety;Decreased awareness of deficits     General Comments: less impulsive today but continues to have poor safety awareness- needs cues for hand placement and overall safety with mobility      Exercises      General Comments General comments (skin integrity, edema, etc.): VSS on RA      Pertinent Vitals/Pain Pain Assessment: Faces Faces Pain Scale: No hurt Pain Intervention(s): Limited activity within patient's tolerance;Monitored during session    Home Living                      Prior Function  PT Goals (current goals can now be found in the care plan section) Acute Rehab PT Goals Patient Stated Goal: return home PT Goal Formulation: With family Time For Goal Achievement: 05/28/21 Potential to Achieve Goals: Fair Progress towards PT goals: Progressing toward goals    Frequency    Min 2X/week      PT Plan Current plan remains appropriate    Co-evaluation              AM-PAC PT "6 Clicks" Mobility    Outcome Measure  Help needed turning from your back to your side while in a flat bed without using bedrails?: A Little Help needed moving from lying on your back to sitting on the side of a flat bed without using bedrails?: A Little Help needed moving to and from a bed to a chair (including a wheelchair)?: A Little Help needed standing up from a chair using your arms (e.g., wheelchair or bedside chair)?: A Little Help needed to walk in hospital room?: A Little Help needed climbing 3-5 steps with a railing? : A Lot 6 Click Score: 17    End of Session   Activity Tolerance: Patient tolerated treatment well Patient left: in chair;with call bell/phone within reach;with chair alarm set Nurse Communication: Mobility status PT Visit Diagnosis: Unsteadiness on feet (R26.81);Repeated falls (R29.6);Muscle weakness (generalized) (M62.81);Difficulty in walking, not elsewhere classified (R26.2)     Time: 3545-6256 PT Time Calculation (min) (ACUTE ONLY): 16 min  Charges:  $Gait Training: 8-22 mins                    Windell Norfolk, DPT, PN1   Supplemental Physical Therapist [place]    Pager [phone] Acute Rehab Office [phone]

## 2021-05-16 NOTE — Progress Notes (Signed)
Lower extremity venous has been completed.   Preliminary results in CV Proc.   Abram Sander 05/16/2021 1:57 PM

## 2021-05-16 NOTE — Progress Notes (Signed)
Tx complete, documentation pending- will still require SNF due to significant weakness, fall risk, and poor safety awareness.   I will attempt to call daughter when my schedule allows.   Windell Norfolk, DPT, PN1   Supplemental Physical Therapist [facility]    Pager [phone] Acute Rehab Office [phone]

## 2021-05-16 NOTE — TOC Progression Note (Signed)
Transition of Care [facility]) - Progression Note    Patient Details  Name: [name] MRN: [id number] Date of Birth: Aug 11, 1929  Transition of Care [facility]) CM/SW [place], LCSW Phone Number: 05/16/2021, 2:25 PM  Clinical Narrative:    8:43am-CSW received call from patient's daughter stating she had contacted [facility] and they stated they have not received the referral. CSW contacted their liaison, Tammy, and she reported the number she contacted probably did not look on the hub system for the referral but that she will contact the daughter and explain they can likely accept patient pending bed availability. Daughter requested to appeal the discharge. RNCM guiding daughter on how to contact Medicare to do so.   12pm-CSW received return call from Langley at [facility]. She requested a TB test for patient. CSW requested from MD.   1:45pm-[facility] contacted CSW and stated they were assessing patient but can probably accept him today.   2pm-Per Gerald Stabs at [facility] they can accept patient today as they only have one bed. CSW contacted patient's daughter and explained that patient is unable to go to Rehab today since appeal has been filed. Daughter is requesting to cancel appeal and send patient to [facility] today. She stated she contacted the Medicare number and withdrew the appeal. CSW will arrange transport.    Expected Discharge Plan: [place] Barriers to Discharge: Continued Medical Work up  Expected Discharge Plan and Services Expected Discharge Plan: [place] In-house Referral: Clinical Social Work   Post Acute Care Choice: [place] Living arrangements for the past 2 months: Single Family Home Expected Discharge Date: 05/16/21                                     Social Determinants of Health (SDOH) Interventions    Readmission Risk Interventions No flowsheet data found.

## 2021-05-17 DIAGNOSIS — R5383 Other fatigue: Secondary | ICD-10-CM | POA: Diagnosis not present

## 2021-05-17 DIAGNOSIS — M25562 Pain in left knee: Secondary | ICD-10-CM | POA: Diagnosis not present

## 2021-05-17 DIAGNOSIS — R5381 Other malaise: Secondary | ICD-10-CM | POA: Diagnosis not present

## 2021-05-17 DIAGNOSIS — Z7901 Long term (current) use of anticoagulants: Secondary | ICD-10-CM | POA: Diagnosis not present

## 2021-05-17 DIAGNOSIS — R531 Weakness: Secondary | ICD-10-CM | POA: Diagnosis not present

## 2021-05-17 DIAGNOSIS — R269 Unspecified abnormalities of gait and mobility: Secondary | ICD-10-CM | POA: Diagnosis not present

## 2021-05-17 DIAGNOSIS — I2510 Atherosclerotic heart disease of native coronary artery without angina pectoris: Secondary | ICD-10-CM | POA: Diagnosis not present

## 2021-05-17 DIAGNOSIS — E785 Hyperlipidemia, unspecified: Secondary | ICD-10-CM | POA: Diagnosis not present

## 2021-05-17 DIAGNOSIS — I700 Atherosclerosis of aorta: Secondary | ICD-10-CM | POA: Diagnosis not present

## 2021-05-17 DIAGNOSIS — I10 Essential (primary) hypertension: Secondary | ICD-10-CM | POA: Diagnosis not present

## 2021-05-17 DIAGNOSIS — M25561 Pain in right knee: Secondary | ICD-10-CM | POA: Diagnosis not present

## 2021-05-17 DIAGNOSIS — K5900 Constipation, unspecified: Secondary | ICD-10-CM | POA: Diagnosis not present

## 2021-05-17 DIAGNOSIS — C4339 Malignant melanoma of other parts of face: Secondary | ICD-10-CM | POA: Diagnosis not present

## 2021-05-17 DIAGNOSIS — R2689 Other abnormalities of gait and mobility: Secondary | ICD-10-CM | POA: Diagnosis not present

## 2021-05-17 DIAGNOSIS — I4891 Unspecified atrial fibrillation: Secondary | ICD-10-CM | POA: Diagnosis not present

## 2021-05-17 DIAGNOSIS — H269 Unspecified cataract: Secondary | ICD-10-CM | POA: Diagnosis not present

## 2021-05-17 DIAGNOSIS — Z951 Presence of aortocoronary bypass graft: Secondary | ICD-10-CM | POA: Diagnosis not present

## 2021-05-17 DIAGNOSIS — N179 Acute kidney failure, unspecified: Secondary | ICD-10-CM | POA: Diagnosis not present

## 2021-05-17 DIAGNOSIS — I480 Paroxysmal atrial fibrillation: Secondary | ICD-10-CM | POA: Diagnosis not present

## 2021-05-17 DIAGNOSIS — Z9181 History of falling: Secondary | ICD-10-CM | POA: Diagnosis not present

## 2021-05-17 DIAGNOSIS — N289 Disorder of kidney and ureter, unspecified: Secondary | ICD-10-CM | POA: Diagnosis not present

## 2021-05-17 DIAGNOSIS — C44311 Basal cell carcinoma of skin of nose: Secondary | ICD-10-CM | POA: Diagnosis not present

## 2021-05-17 DIAGNOSIS — D649 Anemia, unspecified: Secondary | ICD-10-CM | POA: Diagnosis not present

## 2021-05-17 DIAGNOSIS — Z7409 Other reduced mobility: Secondary | ICD-10-CM | POA: Diagnosis not present

## 2021-05-17 DIAGNOSIS — K449 Diaphragmatic hernia without obstruction or gangrene: Secondary | ICD-10-CM | POA: Diagnosis not present

## 2021-05-17 DIAGNOSIS — Z7401 Bed confinement status: Secondary | ICD-10-CM | POA: Diagnosis not present

## 2021-05-17 DIAGNOSIS — U071 COVID-19: Secondary | ICD-10-CM | POA: Diagnosis not present

## 2021-05-17 LAB — CBC WITH DIFFERENTIAL/PLATELET
Abs Immature Granulocytes: 0.04 10*3/uL (ref 0.00–0.07)
Basophils Absolute: 0 10*3/uL (ref 0.0–0.1)
Basophils Relative: 0 %
Eosinophils Absolute: 0.1 10*3/uL (ref 0.0–0.5)
Eosinophils Relative: 1 %
HCT: 30.2 % — ABNORMAL LOW (ref 39.0–52.0)
Hemoglobin: 9.9 g/dL — ABNORMAL LOW (ref 13.0–17.0)
Immature Granulocytes: 0 %
Lymphocytes Relative: 36 %
Lymphs Abs: 3.4 10*3/uL (ref 0.7–4.0)
MCH: 26.3 pg (ref 26.0–34.0)
MCHC: 32.8 g/dL (ref 30.0–36.0)
MCV: 80.3 fL (ref 80.0–100.0)
Monocytes Absolute: 1.1 10*3/uL — ABNORMAL HIGH (ref 0.1–1.0)
Monocytes Relative: 11 %
Neutro Abs: 4.9 10*3/uL (ref 1.7–7.7)
Neutrophils Relative %: 52 %
Platelets: 199 10*3/uL (ref 150–400)
RBC: 3.76 MIL/uL — ABNORMAL LOW (ref 4.22–5.81)
RDW: 18.2 % — ABNORMAL HIGH (ref 11.5–15.5)
WBC: 9.4 10*3/uL (ref 4.0–10.5)
nRBC: 0 % (ref 0.0–0.2)

## 2021-05-17 LAB — D-DIMER, QUANTITATIVE: D-Dimer, Quant: 0.65 ug/mL-FEU — ABNORMAL HIGH (ref 0.00–0.50)

## 2021-05-17 LAB — COMPREHENSIVE METABOLIC PANEL
ALT: 26 U/L (ref 0–44)
AST: 39 U/L (ref 15–41)
Albumin: 2.8 g/dL — ABNORMAL LOW (ref 3.5–5.0)
Alkaline Phosphatase: 32 U/L — ABNORMAL LOW (ref 38–126)
Anion gap: 7 (ref 5–15)
BUN: 40 mg/dL — ABNORMAL HIGH (ref 8–23)
CO2: 23 mmol/L (ref 22–32)
Calcium: 8.8 mg/dL — ABNORMAL LOW (ref 8.9–10.3)
Chloride: 105 mmol/L (ref 98–111)
Creatinine, Ser: 1.06 mg/dL (ref 0.61–1.24)
GFR, Estimated: >60 mL/min (ref >60)
Glucose, Bld: 96 mg/dL (ref 70–99)
Potassium: 4.1 mmol/L (ref 3.5–5.1)
Sodium: 135 mmol/L (ref 135–145)
Total Bilirubin: 0.9 mg/dL (ref 0.3–1.2)
Total Protein: 6 g/dL — ABNORMAL LOW (ref 6.5–8.1)

## 2021-05-17 LAB — MAGNESIUM: Magnesium: 1.8 mg/dL (ref 1.7–2.4)

## 2021-05-17 LAB — C-REACTIVE PROTEIN: CRP: 0.6 mg/dL (ref <1.0)

## 2021-05-17 LAB — BRAIN NATRIURETIC PEPTIDE: B Natriuretic Peptide: 535.8 pg/mL — ABNORMAL HIGH (ref 0.0–100.0)

## 2021-05-17 MED ORDER — LACTATED RINGERS IV SOLN: INTRAVENOUS | Status: DC

## 2021-05-17 MED ORDER — METOPROLOL TARTRATE 25 MG PO TABS
25.0000 mg | ORAL_TABLET | Freq: Two times a day (BID) | ORAL | Status: DC
  Administered 2021-05-17: 25 mg via ORAL
  Filled 2021-05-17: qty 1

## 2021-05-17 NOTE — Progress Notes (Addendum)
Called report to [facility] [phone] and spoke to Orick, Therapist, art, to give report to D/C and transport the patient to the facility by [place]. Due to a pending quantiferon gold TB lab, Patrina stated that she could not accept the patient until this lab result was confirmed negative. Patrina confirmed that they could accept the patient on Saturday July 9th, and his room would be still ready. This RN spoke to the patient and his daughter, Butch Penny, about the pending blood work and [employer] stipulation to accept the patient. The patient and daughter were understanding of the situation. [facility] was notified of the situation as well.   One the the quantiferon TB lab is confirmed negative. [facility] and the patient's daughter [phone]) wants to be notified of result. Also, PTAR needs to be rescheduled afterwards.

## 2021-05-17 NOTE — Plan of Care (Signed)

## 2021-05-17 NOTE — Progress Notes (Signed)
PROGRESS NOTE                                                                                                                                                                                                             Patient Demographics:    [name], is a 87 y.o. male, DOB - [birth year], XIH:[id number]  Outpatient Primary MD for the patient is [name], MD    LOS - 4  Admit date - 05/12/2021    Chief Complaint  Patient presents with   Fall       Brief Narrative (HPI from H&P) - [name] is a 87 y.o. male with medical history significant of hypertension, hyperlipidemia, CAD s/p CABG, and gait disturbance (walks with a walker) who presents after having 2 falls yesterday, per wife 3-4 falls in the last 1 yr, has had 3 shots of COVID-vaccine so far, in the ER he was diagnosed with incidental COVID-19 infection, dehydration and AKI, admitted for further care.   He is medically stable for discharge we await a bed at SNF.   Subjective:   Patient in bed, appears comfortable, denies any headache, no fever, no chest pain or pressure, no shortness of breath , no abdominal pain. No new focal weakness.   Assessment  & Plan :     Acute Covid 19 Viral  Infection  - he has received 3 shots of COVID-19 vaccine, he has COVID-related systemic symptoms causing dehydration and AKI, CRP is mildly elevated and D-dimer is moderately elevated, he is being treated with IV fluids for dehydration and AKI, low-dose IV steroids and 3 doses of remdesivir.  negative lower extremity venous duplex .  Encouraged the patient to sit up in chair in the daytime use I-S and flutter valve for pulmonary toiletry.  Will advance activity and titrate down oxygen as possible.  SpO2: 97 %  Recent Labs  Lab 05/13/21 0020 05/13/21 0132 05/13/21 1733 05/14/21 0111 05/14/21 0745 05/15/21 0106 05/16/21 0136 05/17/21 0216  WBC 8.1  --   --   7.8  --  8.5 7.9 9.4  HGB 9.7*  --  10.4* 9.4*  --  9.3* 9.8* 9.9*  HCT 30.3*  --  33.1* 30.0*  --  29.4* 30.3* 30.2*  PLT 143*  --   --  151  --  156 179  199  CRP  --   --  1.5* 1.3*  --  0.8 0.6 0.6  BNP 154.5*  --   --   --  404.2* 538.2* 748.0* 535.8*  DDIMER  --   --  2.88* 2.75*  --  1.20* 0.85* 0.65*  PROCALCITON  --   --  <0.10  --   --   --   --   --   AST 29  --   --  49*  --  52* 45* 39  ALT 15  --   --  19  --  22 25 26   ALKPHOS 35*  --   --  32*  --  31* 32* 32*  BILITOT 0.6  --   --  0.6  --  0.6 0.6 0.9  ALBUMIN 3.7  --   --  2.9*  --  2.8* 2.8* 2.8*  SARSCOV2NAA  --  POSITIVE*  --   --   --   --   --   --    Lab Results  Component Value Date   TSH 0.877 05/14/2021     2.  Dehydration with AKI.  Hydrate with IV fluids and avoid nephrotoxins.  3.  New onset A. fib.  Currently in RVR, [place] vas 2 score of greater than 3.  He has had 3-4 falls over the last 1 year but still has decent quality of life, discussed risks and benefits with wife and patient, they would like to consider anticoagulation and avoid ischemic stroke if possible.  For now we will get a EKG, stable TSH & TTE, rate control better after being placed on oral beta-blocker, currently on Eliquis, cardiology on board.    4.  CAD s/p CABG in 2019.  No acute issues follows with Dr. Johnsie Cancel, echo pending, on Plavix which will be continued, eventually oral beta-blocker if possible  5.  Balance with 3-4 falls over the last 1 year.  PT OT, may require SNF.  6.  Mild thrombocytopenia could be due to viral illness.  Improving with supportive care.  7.  Dyslipidemia.  On statin.  8.  Essential hypertension.  Blood pressure was soft hence ACE inhibitor being held, monitor may require beta-blocker for A. fib.  Obesity: Estimated body mass index is 29.95 kg/m as calculated from the following:   Height as of this encounter: 5\' 5"  (1.651 m).   Weight as of this encounter: 81.6 kg.          Condition -  Fair  Family Communication  :  wife on 05/14/21, daughter Butch Penny [phone] on 05/17/2021  Code Status :  Full  Consults  :  Cards  PUD Prophylaxis : PPI   Procedures  :     TTE      Disposition Plan  :    Status is: Inpatient  Remains inpatient appropriate because:IV treatments appropriate due to intensity of illness or inability to take PO  Dispo: The patient is from: Home              Anticipated d/c is to: Home              Patient currently is not medically stable to d/c.   Difficult to place patient Yes  DVT Prophylaxis  :    SCDs Start: 05/13/21 1701 apixaban (ELIQUIS) tablet 5 mg  Lab Results  Component Value Date   PLT 199 05/17/2021    Diet :  Diet Order  Diet - low sodium heart healthy           Diet Heart Room service appropriate? Yes; Fluid consistency: Thin  Diet effective now                    Inpatient Medications  Scheduled Meds:   apixaban  5 mg Oral BID   vitamin C  500 mg Oral Daily   atorvastatin  20 mg Oral q1800   diclofenac Sodium  2 g Topical BID   latanoprost  1 drop Left Eye QHS   metoprolol tartrate  25 mg Oral BID   pantoprazole  40 mg Oral QODAY   pneumococcal 23 valent vaccine  0.5 mL Intramuscular Tomorrow-1000   sodium chloride flush  3 mL Intravenous Q12H   zinc sulfate  220 mg Oral Daily   Continuous Infusions:  lactated ringers     PRN Meds:.albuterol, chlorpheniramine-HYDROcodone, guaiFENesin-dextromethorphan, [DISCONTINUED] ondansetron **OR** ondansetron (ZOFRAN) IV  Antibiotics  :    Anti-infectives (From admission, onward)    Start     Dose/Rate Route Frequency Ordered Stop   05/14/21 1000  remdesivir 100 mg in sodium chloride 0.9 % 100 mL IVPB  Status:  Discontinued       See Hyperspace for full Linked Orders Report.   100 mg 200 mL/hr over 30 Minutes Intravenous Daily 05/13/21 0402 05/16/21 0906   05/13/21 0430  remdesivir 100 mg in sodium chloride 0.9 % 100 mL IVPB       See Hyperspace  for full Linked Orders Report.   100 mg 200 mL/hr over 30 Minutes Intravenous Every 30 min 05/13/21 0402 05/13/21 0526        Time Spent in minutes  30   Lala Lund M.D on 05/17/2021 at 9:51 AM  To page go to [email or web address]   [facility] -  Office  [phone]  See all Orders from today for further details    Objective:   Vitals:   05/16/21 1918 05/16/21 2323 05/17/21 0323 05/17/21 0806  BP: 114/76 107/85 98/65 118/70  Pulse: 63 71 65 82  Resp: 20 20 20 13   Temp: (!) 97.4 F (36.3 C) 98.2 F (36.8 C) 98 F (36.7 C) 98.2 F (36.8 C)  TempSrc: Oral Oral Axillary Oral  SpO2: 97% 95% 92% 97%  Weight:      Height:        Wt Readings from Last 3 Encounters:  05/13/21 81.6 kg  01/02/21 81.6 kg  10/30/19 83.6 kg     Intake/Output Summary (Last 24 hours) at 05/17/2021 0951 Last data filed at 05/17/2021 0700 Gross per 24 hour  Intake 657 ml  Output 1700 ml  Net -1043 ml     Physical Exam  Awake Alert, No new F.N deficits, Normal affect [place].AT,PERRAL Supple Neck,No JVD, No cervical lymphadenopathy appriciated.  Symmetrical Chest wall movement, Good air movement bilaterally, CTAB RRR,No Gallops, Rubs or new Murmurs, No Parasternal Heave +ve B.Sounds, Abd Soft, No tenderness, No organomegaly appriciated, No rebound - guarding or rigidity. No Cyanosis, Clubbing or edema, No new Rash or bruise    Data Review:    CBC Recent Labs  Lab 05/13/21 0020 05/13/21 1733 05/14/21 0111 05/15/21 0106 05/16/21 0136 05/17/21 0216  WBC 8.1  --  7.8 8.5 7.9 9.4  HGB 9.7* 10.4* 9.4* 9.3* 9.8* 9.9*  HCT 30.3* 33.1* 30.0* 29.4* 30.3* 30.2*  PLT 143*  --  151 156 179 199  MCV 81.2  --  82.2 81.4 81.2  80.3  MCH 26.0  --  25.8* 25.8* 26.3 26.3  MCHC 32.0  --  31.3 31.6 32.3 32.8  RDW 18.0*  --  18.0* 18.1* 18.5* 18.2*  LYMPHSABS 1.1  --  1.3 1.5 1.6 3.4  MONOABS 1.3*  --  0.8 0.5 0.5 1.1*  EOSABS 0.0  --  0.0 0.0 0.0 0.1  BASOSABS 0.0  --  0.0 0.0 0.0 0.0     Recent Labs  Lab 05/13/21 0020 05/13/21 1733 05/14/21 0111 05/14/21 0745 05/14/21 1034 05/15/21 0106 05/16/21 0136 05/17/21 0216  NA 136 134* 137  --   --  137 136 135  K 3.9 3.9 4.2  --   --  4.5 4.5 4.1  CL 103 103 109  --   --  111 107 105  CO2 21* 21* 22  --   --  21* 23 23  GLUCOSE 118* 149* 117*  --   --  110* 125* 96  BUN 38* 29* 37*  --   --  41* 42* 40*  CREATININE 1.58* 1.19 1.26*  --   --  1.00 1.14 1.06  CALCIUM 9.1 8.3* 8.3*  --   --  8.1* 8.5* 8.8*  AST 29  --  49*  --   --  52* 45* 39  ALT 15  --  19  --   --  22 25 26   ALKPHOS 35*  --  32*  --   --  31* 32* 32*  BILITOT 0.6  --  0.6  --   --  0.6 0.6 0.9  ALBUMIN 3.7  --  2.9*  --   --  2.8* 2.8* 2.8*  MG  --   --  1.9  --   --  1.9 1.8 1.8  CRP  --  1.5* 1.3*  --   --  0.8 0.6 0.6  DDIMER  --  2.88* 2.75*  --   --  1.20* 0.85* 0.65*  PROCALCITON  --  <0.10  --   --   --   --   --   --   TSH  --   --   --   --  0.877  --   --   --   BNP 154.5*  --   --  404.2*  --  538.2* 748.0* 535.8*   Lab Results  Component Value Date   TSH 0.877 05/14/2021     Radiology Reports DG Chest 2 View  Result Date: 05/13/2021 CLINICAL DATA:  Fall EXAM: CHEST - 2 VIEW COMPARISON:  07/26/2009 FINDINGS: Lungs are clear.  No pleural effusion or pneumothorax. The heart is normal in size. Postsurgical changes related to prior CABG. IMPRESSION: No evidence of acute cardiopulmonary disease. Electronically Signed   By: Julian Hy M.D.   On: 05/13/2021 01:09   DG Chest Port 1 View  Result Date: 05/14/2021 CLINICAL DATA:  Shortness of breath, COVID positive by report. History of heart surgery. EXAM: PORTABLE CHEST 1 VIEW COMPARISON:  May 13, 2021. FINDINGS: EKG leads project over the chest. Post median sternotomy for CABG. Signs of LIMA grafting. Cardiomediastinal contours and hilar structures are stable. Trachea is in the midline accounting for mild rotation and mild buckling due to low lung volumes. Signs of aortic  atherosclerosis. Hiatal hernia projects posterior to the heart as on previous imaging. No sign of lobar consolidative changes. Vascular crowding without frank edema mild asymmetry of interstitial markings on the LEFT as compared to the RIGHT in the  upper chest. No sign of effusion. No pneumothorax. On limited assessment no acute skeletal process. Signs of prior trauma to LEFT-sided ribs. Signs of cement augmentation lumbar and thoracic spine not well evaluated. IMPRESSION: 1. Mild pulmonary vascular congestion and or vascular crowding. Question of mild increased interstitial markings on the LEFT could reflect mild pneumonitis or early asymmetric pulmonary edema. 2. Hiatal hernia. 3. Post CABG. 4. Signs of aortic atherosclerosis. Aortic Atherosclerosis (ICD10-I70.0). Electronically Signed   By: Zetta Bills M.D.   On: 05/14/2021 08:33   DG Knee Complete 4 Views Left  Result Date: 05/13/2021 CLINICAL DATA:  Fall EXAM: LEFT KNEE - COMPLETE 4+ VIEW COMPARISON:  None. FINDINGS: No fracture or dislocation is seen. Mild degenerative changes with sharpening of the tibial spines, patellofemoral joint space narrowing with small osteophytes, and lateral compartment chondrocalcinosis. Visualized soft tissues are within normal limits. No suprapatellar knee joint effusion. IMPRESSION: No fracture or dislocation is seen. Mild degenerative changes, as above. Electronically Signed   By: Julian Hy M.D.   On: 05/13/2021 01:10   DG Knee Complete 4 Views Right  Result Date: 05/13/2021 CLINICAL DATA:  Fall EXAM: RIGHT KNEE - COMPLETE 4+ VIEW COMPARISON:  None. FINDINGS: No fracture or dislocation is seen. Mild lateral compartment chondrocalcinosis. Joint spaces are essentially preserved. Mild prepatellar soft tissue swelling. IMPRESSION: Mild prepatellar soft tissue swelling. Electronically Signed   By: Julian Hy M.D.   On: 05/13/2021 01:11   ECHOCARDIOGRAM COMPLETE  Result Date: 05/16/2021    ECHOCARDIOGRAM  LIMITED REPORT   Patient Name:   RAMZI BRATHWAITE Date of Exam: 05/14/2021 Medical Rec #:  [id number]        Height:       65.0 in Accession #:    [id number]       Weight:       180.0 lb Date of Birth:  04-Feb-1929         BSA:          1.892 m Patient Age:    37 years         BP:           104/65 mmHg Patient Gender: M                HR:           71 bpm. Exam Location:  Inpatient Procedure: 2D Echo, Cardiac Doppler and Color Doppler Indications:     Elevated troponin  History:         Patient has prior history of Echocardiogram examinations, most                  recent 11/18/2011. CAD; Risk Factors:Hypertension and                  Dyslipidemia.  Sonographer:     Cammy Brochure Referring Phys:  Arnell Asal [facility] Diagnosing Phys: Buford Dresser MD IMPRESSIONS  1. Left ventricular ejection fraction, by estimation, is 50 to 55%. The left ventricle has low normal function. The left ventricle has no regional wall motion abnormalities. There is moderate asymmetric left ventricular hypertrophy of the basal-septal segment. Left ventricular diastolic function could not be evaluated.  2. Right ventricular systolic function is normal. The right ventricular size is normal. Tricuspid regurgitation signal is inadequate for assessing PA pressure.  3. Left atrial size was moderately dilated.  4. The mitral valve is grossly normal. Trivial mitral valve regurgitation. No evidence of mitral stenosis.  5. The aortic  valve is tricuspid. There is mild calcification of the aortic valve. Aortic valve regurgitation is not visualized. Mild aortic valve sclerosis is present, with no evidence of aortic valve stenosis. Comparison(s): No significant change from prior study. FINDINGS  Left Ventricle: Left ventricular ejection fraction, by estimation, is 50 to 55%. The left ventricle has low normal function. The left ventricle has no regional wall motion abnormalities. There is moderate asymmetric left ventricular hypertrophy of the  basal-septal segment. Left ventricular diastolic function could not be evaluated. Left ventricular diastolic function could not be evaluated due to atrial fibrillation. Right Ventricle: The right ventricular size is normal. Right vetricular wall thickness was not well visualized. Right ventricular systolic function is normal. Tricuspid regurgitation signal is inadequate for assessing PA pressure. Left Atrium: Left atrial size was moderately dilated. Right Atrium: Right atrial size was normal in size. Pericardium: Trivial pericardial effusion is present. Mitral Valve: The mitral valve is grossly normal. Mild to moderate mitral annular calcification. Trivial mitral valve regurgitation. No evidence of mitral valve stenosis. Tricuspid Valve: The tricuspid valve is normal in structure. Tricuspid valve regurgitation is trivial. No evidence of tricuspid stenosis. Aortic Valve: The aortic valve is tricuspid. There is mild calcification of the aortic valve. Aortic valve regurgitation is not visualized. Mild aortic valve sclerosis is present, with no evidence of aortic valve stenosis. Aortic valve mean gradient measures 4.0 mmHg. Aortic valve peak gradient measures 8.3 mmHg. Aortic valve area, by VTI measures 2.20 cm. Pulmonic Valve: The pulmonic valve was grossly normal. Pulmonic valve regurgitation is mild. Aorta: The aortic root, ascending aorta and aortic arch are all structurally normal, with no evidence of dilitation or obstruction. Venous: The inferior vena cava was not well visualized. IAS/Shunts: The interatrial septum was not well visualized. LEFT VENTRICLE PLAX 2D LVIDd:         2.80 cm LVIDs:         2.10 cm LV PW:         1.30 cm LV IVS:        2.00 cm LVOT diam:     2.10 cm LV SV:         63 LV SV Index:   34 LVOT Area:     3.46 cm  LEFT ATRIUM             Index LA diam:        3.30 cm 1.74 cm/m LA Vol (A2C):   74.3 ml 39.28 ml/m LA Vol (A4C):   82.4 ml 43.56 ml/m LA Biplane Vol: 82.8 ml 43.77 ml/m  AORTIC  VALVE AV Area (Vmax):    2.02 cm AV Area (Vmean):   1.82 cm AV Area (VTI):     2.20 cm AV Vmax:           144.00 cm/s AV Vmean:          95.500 cm/s AV VTI:            0.288 m AV Peak Grad:      8.3 mmHg AV Mean Grad:      4.0 mmHg LVOT Vmax:         84.10 cm/s LVOT Vmean:        50.200 cm/s LVOT VTI:          0.183 m LVOT/AV VTI ratio: 0.64  AORTA Ao Root diam: 3.70 cm Ao Asc diam:  3.10 cm MITRAL VALVE MV Area (PHT): 4.49 cm     SHUNTS MV Decel Time: 169 msec  Systemic VTI:  0.18 m MV E velocity: 126.00 cm/s  Systemic Diam: 2.10 cm MV A velocity: 29.90 cm/s MV E/A ratio:  4.21 Buford Dresser MD Electronically signed by Buford Dresser MD Signature Date/Time: 05/14/2021/5:51:07 PM    Final (Updated)    DG HIP UNILAT W OR W/O PELVIS 2-3 VIEWS RIGHT  Result Date: 05/13/2021 CLINICAL DATA:  Fall EXAM: DG HIP (WITH OR WITHOUT PELVIS) 2-3V RIGHT COMPARISON:  None. FINDINGS: No fracture or dislocation is seen. Bilateral hip joint spaces are preserved. Visualized bony pelvis appears intact. Degenerative changes of the lower lumbar spine with prior vertebral augmentation. Vascular calcifications. IMPRESSION: Negative. Electronically Signed   By: Julian Hy M.D.   On: 05/13/2021 01:10   VAS [place] LOWER EXTREMITY VENOUS (DVT)  Result Date: 05/16/2021  Lower Venous DVT Study Patient Name:  BARLOW HARRISON  Date of Exam:   05/16/2021 Medical Rec #: [id number]         Accession #:    [id number] Date of Birth: 1929/08/31          Patient Gender: M Patient Age:   89Y Exam Location:  [facility] Procedure:      VAS [place] LOWER EXTREMITY VENOUS (DVT) Referring Phys: [id number] Margaree [facility] --------------------------------------------------------------------------------  Indications: Swelling, Edema, and Rapidly rising D-dimer.  Comparison Study: no prior Performing Technologist: Archie Patten RVS  Examination Guidelines: A complete evaluation includes B-mode imaging, spectral Doppler, color Doppler,  and power Doppler as needed of all accessible portions of each vessel. Bilateral testing is considered an integral part of a complete examination. Limited examinations for reoccurring indications may be performed as noted. The reflux portion of the exam is performed with the patient in reverse Trendelenburg.  +---------+---------------+---------+-----------+----------+--------------+ RIGHT    CompressibilityPhasicitySpontaneityPropertiesThrombus Aging +---------+---------------+---------+-----------+----------+--------------+ CFV      Full           Yes      Yes                                 +---------+---------------+---------+-----------+----------+--------------+ SFJ      Full                                                        +---------+---------------+---------+-----------+----------+--------------+ FV Prox  Full                                                        +---------+---------------+---------+-----------+----------+--------------+ FV Mid   Full                                                        +---------+---------------+---------+-----------+----------+--------------+ FV DistalFull                                                        +---------+---------------+---------+-----------+----------+--------------+  PFV      Full                                                        +---------+---------------+---------+-----------+----------+--------------+ POP      Full           Yes      Yes                                 +---------+---------------+---------+-----------+----------+--------------+ PTV      Full                                                        +---------+---------------+---------+-----------+----------+--------------+ PERO     Full                                                        +---------+---------------+---------+-----------+----------+--------------+    +---------+---------------+---------+-----------+----------+--------------+ LEFT     CompressibilityPhasicitySpontaneityPropertiesThrombus Aging +---------+---------------+---------+-----------+----------+--------------+ CFV      Full           Yes      Yes                                 +---------+---------------+---------+-----------+----------+--------------+ SFJ      Full                                                        +---------+---------------+---------+-----------+----------+--------------+ FV Prox  Full                                                        +---------+---------------+---------+-----------+----------+--------------+ FV Mid   Full                                                        +---------+---------------+---------+-----------+----------+--------------+ FV DistalFull                                                        +---------+---------------+---------+-----------+----------+--------------+ PFV      Full                                                        +---------+---------------+---------+-----------+----------+--------------+  POP      Full           Yes      Yes                                 +---------+---------------+---------+-----------+----------+--------------+ PTV      Full                                                        +---------+---------------+---------+-----------+----------+--------------+ PERO     Full                                                        +---------+---------------+---------+-----------+----------+--------------+     Summary: BILATERAL: - No evidence of deep vein thrombosis seen in the lower extremities, bilaterally. -No evidence of popliteal cyst, bilaterally.   *See table(s) above for measurements and observations.    Preliminary

## 2021-05-17 NOTE — Care Management (Addendum)
Spoke w lab, they state that Quant TB [facility] Rm is a send out lab to [place]. They provided me with [facility]'s number, [phone].  [facility] representative states that it takes between 2-5 days for test to incubate and result. LVM with Holly Bodily [phone] and Ludger Nutting [phone] at [facility] to update them on the above information. Awaiting callback, will update family with any information I get from [facility] when they call back.    Spoke w Sharyn Lull [phone] at [facility].  She states that they do not require the TB test and they have bed available today to accept patient. Report can be called to [phone].   CM spoke w daughter who is in agreement for DC to [facility]. CM notifed nurse and MD, and [facility] has been called for 1pm per MD request.

## 2021-05-18 DIAGNOSIS — N179 Acute kidney failure, unspecified: Secondary | ICD-10-CM | POA: Diagnosis not present

## 2021-05-18 DIAGNOSIS — I2510 Atherosclerotic heart disease of native coronary artery without angina pectoris: Secondary | ICD-10-CM | POA: Diagnosis not present

## 2021-05-18 DIAGNOSIS — U071 COVID-19: Secondary | ICD-10-CM | POA: Diagnosis not present

## 2021-05-18 DIAGNOSIS — I4891 Unspecified atrial fibrillation: Secondary | ICD-10-CM | POA: Diagnosis not present

## 2021-05-18 DIAGNOSIS — R269 Unspecified abnormalities of gait and mobility: Secondary | ICD-10-CM | POA: Diagnosis not present

## 2021-05-19 ENCOUNTER — Encounter (INDEPENDENT_AMBULATORY_CARE_PROVIDER_SITE_OTHER): Payer: Medicare Other | Admitting: Ophthalmology

## 2021-05-19 DIAGNOSIS — Z7409 Other reduced mobility: Secondary | ICD-10-CM | POA: Diagnosis not present

## 2021-05-19 DIAGNOSIS — I480 Paroxysmal atrial fibrillation: Secondary | ICD-10-CM | POA: Diagnosis not present

## 2021-05-19 DIAGNOSIS — N289 Disorder of kidney and ureter, unspecified: Secondary | ICD-10-CM | POA: Diagnosis not present

## 2021-05-19 DIAGNOSIS — I10 Essential (primary) hypertension: Secondary | ICD-10-CM | POA: Diagnosis not present

## 2021-05-19 DIAGNOSIS — U071 COVID-19: Secondary | ICD-10-CM | POA: Diagnosis not present

## 2021-05-20 DIAGNOSIS — N289 Disorder of kidney and ureter, unspecified: Secondary | ICD-10-CM | POA: Diagnosis not present

## 2021-05-20 DIAGNOSIS — I480 Paroxysmal atrial fibrillation: Secondary | ICD-10-CM | POA: Diagnosis not present

## 2021-05-20 DIAGNOSIS — I10 Essential (primary) hypertension: Secondary | ICD-10-CM | POA: Diagnosis not present

## 2021-05-20 DIAGNOSIS — U071 COVID-19: Secondary | ICD-10-CM | POA: Diagnosis not present

## 2021-05-20 DIAGNOSIS — Z7409 Other reduced mobility: Secondary | ICD-10-CM | POA: Diagnosis not present

## 2021-05-21 LAB — QUANTIFERON-TB GOLD PLUS: QuantiFERON-TB Gold Plus: NEGATIVE

## 2021-05-21 LAB — QUANTIFERON-TB GOLD PLUS (RQFGPL)
QuantiFERON Mitogen Value: >10 IU/mL
QuantiFERON Nil Value: 0.01 IU/mL
QuantiFERON TB1 Ag Value: 0.07 IU/mL
QuantiFERON TB2 Ag Value: 0.05 IU/mL

## 2021-05-23 DIAGNOSIS — I480 Paroxysmal atrial fibrillation: Secondary | ICD-10-CM | POA: Diagnosis not present

## 2021-05-23 DIAGNOSIS — Z7409 Other reduced mobility: Secondary | ICD-10-CM | POA: Diagnosis not present

## 2021-05-23 DIAGNOSIS — N289 Disorder of kidney and ureter, unspecified: Secondary | ICD-10-CM | POA: Diagnosis not present

## 2021-05-23 DIAGNOSIS — U071 COVID-19: Secondary | ICD-10-CM | POA: Diagnosis not present

## 2021-05-23 DIAGNOSIS — I10 Essential (primary) hypertension: Secondary | ICD-10-CM | POA: Diagnosis not present

## 2021-05-24 DIAGNOSIS — N289 Disorder of kidney and ureter, unspecified: Secondary | ICD-10-CM | POA: Diagnosis not present

## 2021-05-24 DIAGNOSIS — U071 COVID-19: Secondary | ICD-10-CM | POA: Diagnosis not present

## 2021-05-24 DIAGNOSIS — Z7409 Other reduced mobility: Secondary | ICD-10-CM | POA: Diagnosis not present

## 2021-05-24 DIAGNOSIS — I10 Essential (primary) hypertension: Secondary | ICD-10-CM | POA: Diagnosis not present

## 2021-05-24 DIAGNOSIS — I480 Paroxysmal atrial fibrillation: Secondary | ICD-10-CM | POA: Diagnosis not present

## 2021-06-04 DIAGNOSIS — Z87891 Personal history of nicotine dependence: Secondary | ICD-10-CM | POA: Diagnosis not present

## 2021-06-04 DIAGNOSIS — Z7901 Long term (current) use of anticoagulants: Secondary | ICD-10-CM | POA: Diagnosis not present

## 2021-06-04 DIAGNOSIS — Z85820 Personal history of malignant melanoma of skin: Secondary | ICD-10-CM | POA: Diagnosis not present

## 2021-06-04 DIAGNOSIS — N179 Acute kidney failure, unspecified: Secondary | ICD-10-CM | POA: Diagnosis not present

## 2021-06-04 DIAGNOSIS — Z9181 History of falling: Secondary | ICD-10-CM | POA: Diagnosis not present

## 2021-06-04 DIAGNOSIS — U071 COVID-19: Secondary | ICD-10-CM | POA: Diagnosis not present

## 2021-06-04 DIAGNOSIS — I10 Essential (primary) hypertension: Secondary | ICD-10-CM | POA: Diagnosis not present

## 2021-06-04 DIAGNOSIS — Z951 Presence of aortocoronary bypass graft: Secondary | ICD-10-CM | POA: Diagnosis not present

## 2021-06-04 DIAGNOSIS — E860 Dehydration: Secondary | ICD-10-CM | POA: Diagnosis not present

## 2021-06-04 DIAGNOSIS — E785 Hyperlipidemia, unspecified: Secondary | ICD-10-CM | POA: Diagnosis not present

## 2021-06-04 DIAGNOSIS — I4891 Unspecified atrial fibrillation: Secondary | ICD-10-CM | POA: Diagnosis not present

## 2021-06-04 DIAGNOSIS — D649 Anemia, unspecified: Secondary | ICD-10-CM | POA: Diagnosis not present

## 2021-06-04 DIAGNOSIS — I2510 Atherosclerotic heart disease of native coronary artery without angina pectoris: Secondary | ICD-10-CM | POA: Diagnosis not present

## 2021-06-04 DIAGNOSIS — Z85828 Personal history of other malignant neoplasm of skin: Secondary | ICD-10-CM | POA: Diagnosis not present

## 2021-06-09 DIAGNOSIS — D696 Thrombocytopenia, unspecified: Secondary | ICD-10-CM | POA: Diagnosis not present

## 2021-06-09 DIAGNOSIS — D649 Anemia, unspecified: Secondary | ICD-10-CM | POA: Diagnosis not present

## 2021-06-09 DIAGNOSIS — U071 COVID-19: Secondary | ICD-10-CM | POA: Diagnosis not present

## 2021-06-09 DIAGNOSIS — D6869 Other thrombophilia: Secondary | ICD-10-CM | POA: Diagnosis not present

## 2021-06-09 DIAGNOSIS — R748 Abnormal levels of other serum enzymes: Secondary | ICD-10-CM | POA: Diagnosis not present

## 2021-06-09 DIAGNOSIS — I4891 Unspecified atrial fibrillation: Secondary | ICD-10-CM | POA: Diagnosis not present

## 2021-06-09 DIAGNOSIS — N179 Acute kidney failure, unspecified: Secondary | ICD-10-CM | POA: Diagnosis not present

## 2021-06-09 DIAGNOSIS — R197 Diarrhea, unspecified: Secondary | ICD-10-CM | POA: Diagnosis not present

## 2021-06-10 DIAGNOSIS — E860 Dehydration: Secondary | ICD-10-CM | POA: Diagnosis not present

## 2021-06-10 DIAGNOSIS — N179 Acute kidney failure, unspecified: Secondary | ICD-10-CM | POA: Diagnosis not present

## 2021-06-10 DIAGNOSIS — I10 Essential (primary) hypertension: Secondary | ICD-10-CM | POA: Diagnosis not present

## 2021-06-10 DIAGNOSIS — E785 Hyperlipidemia, unspecified: Secondary | ICD-10-CM | POA: Diagnosis not present

## 2021-06-10 DIAGNOSIS — I2510 Atherosclerotic heart disease of native coronary artery without angina pectoris: Secondary | ICD-10-CM | POA: Diagnosis not present

## 2021-06-10 DIAGNOSIS — U071 COVID-19: Secondary | ICD-10-CM | POA: Diagnosis not present

## 2021-06-13 DIAGNOSIS — I2510 Atherosclerotic heart disease of native coronary artery without angina pectoris: Secondary | ICD-10-CM | POA: Diagnosis not present

## 2021-06-13 DIAGNOSIS — N179 Acute kidney failure, unspecified: Secondary | ICD-10-CM | POA: Diagnosis not present

## 2021-06-13 DIAGNOSIS — U071 COVID-19: Secondary | ICD-10-CM | POA: Diagnosis not present

## 2021-06-13 DIAGNOSIS — E860 Dehydration: Secondary | ICD-10-CM | POA: Diagnosis not present

## 2021-06-13 DIAGNOSIS — I10 Essential (primary) hypertension: Secondary | ICD-10-CM | POA: Diagnosis not present

## 2021-06-13 DIAGNOSIS — E785 Hyperlipidemia, unspecified: Secondary | ICD-10-CM | POA: Diagnosis not present

## 2021-06-16 ENCOUNTER — Telehealth: Payer: Self-pay | Admitting: Cardiovascular Disease

## 2021-06-16 MED ORDER — APIXABAN 5 MG PO TABS: 5.0000 mg | ORAL_TABLET | Freq: Two times a day (BID) | ORAL | 6 refills | Status: DC

## 2021-06-16 NOTE — Telephone Encounter (Signed)
*  STAT* If patient is at the pharmacy, call can be transferred to refill team.   1. Which medications need to be refilled? (please list name of each medication and dose if known) apixaban (ELIQUIS) 5 MG TABS tablet  2. Which pharmacy/location (including street and city if local pharmacy) is medication to be sent to? [id number] [place] 3. Do they need a 30 day or 90 day supply? 30 ds

## 2021-06-16 NOTE — Telephone Encounter (Signed)
Eliquis mg refill request received. Patient is 87 years old, weight- 81.6kg, Crea- 1.06 and last seen by PN on 01/02/21. Dose is appropriate based on dosing criteria. Will send in refill to requested pharmacy.

## 2021-06-17 DIAGNOSIS — N179 Acute kidney failure, unspecified: Secondary | ICD-10-CM | POA: Diagnosis not present

## 2021-06-17 DIAGNOSIS — E860 Dehydration: Secondary | ICD-10-CM | POA: Diagnosis not present

## 2021-06-17 DIAGNOSIS — E785 Hyperlipidemia, unspecified: Secondary | ICD-10-CM | POA: Diagnosis not present

## 2021-06-17 DIAGNOSIS — I10 Essential (primary) hypertension: Secondary | ICD-10-CM | POA: Diagnosis not present

## 2021-06-17 DIAGNOSIS — I2510 Atherosclerotic heart disease of native coronary artery without angina pectoris: Secondary | ICD-10-CM | POA: Diagnosis not present

## 2021-06-17 DIAGNOSIS — U071 COVID-19: Secondary | ICD-10-CM | POA: Diagnosis not present

## 2021-06-19 ENCOUNTER — Encounter (INDEPENDENT_AMBULATORY_CARE_PROVIDER_SITE_OTHER): Payer: Medicare Other | Admitting: Ophthalmology

## 2021-06-19 DIAGNOSIS — I10 Essential (primary) hypertension: Secondary | ICD-10-CM | POA: Diagnosis not present

## 2021-06-19 DIAGNOSIS — N179 Acute kidney failure, unspecified: Secondary | ICD-10-CM | POA: Diagnosis not present

## 2021-06-19 DIAGNOSIS — I2510 Atherosclerotic heart disease of native coronary artery without angina pectoris: Secondary | ICD-10-CM | POA: Diagnosis not present

## 2021-06-19 DIAGNOSIS — U071 COVID-19: Secondary | ICD-10-CM | POA: Diagnosis not present

## 2021-06-19 DIAGNOSIS — E785 Hyperlipidemia, unspecified: Secondary | ICD-10-CM | POA: Diagnosis not present

## 2021-06-19 DIAGNOSIS — E860 Dehydration: Secondary | ICD-10-CM | POA: Diagnosis not present

## 2021-06-26 DIAGNOSIS — N179 Acute kidney failure, unspecified: Secondary | ICD-10-CM | POA: Diagnosis not present

## 2021-06-26 DIAGNOSIS — U071 COVID-19: Secondary | ICD-10-CM | POA: Diagnosis not present

## 2021-06-26 DIAGNOSIS — I2510 Atherosclerotic heart disease of native coronary artery without angina pectoris: Secondary | ICD-10-CM | POA: Diagnosis not present

## 2021-06-26 DIAGNOSIS — E860 Dehydration: Secondary | ICD-10-CM | POA: Diagnosis not present

## 2021-06-26 DIAGNOSIS — E785 Hyperlipidemia, unspecified: Secondary | ICD-10-CM | POA: Diagnosis not present

## 2021-06-26 DIAGNOSIS — I10 Essential (primary) hypertension: Secondary | ICD-10-CM | POA: Diagnosis not present

## 2021-06-27 NOTE — Progress Notes (Signed)
Evaluation Performed:  Follow-up visit  Date:  07/02/2021   ID:  [name], DOB Aug 22, 1929, MRN [id number]  Provider Location: Office  PCP:  [name], MD  Cardiologist:  [name], MD [facility] Electrophysiologist:  None   Chief Complaint:  CAD/CABG  History of Present Illness:    HPI: 87 y.o. CAD/ CABG in 1999 last myovue 2018 no ischemia. Seen by PA in July 2019  for "syncope" thought to be vasovagal Holter done 05/24/18 no significant arrhythmia EF 48% on myovue June 2018   His syncope episodes are precipitated by pain in his jaw/teeth when Eating Denies TMJ or chronic dental issues.    No chest pain , postural symptoms dyspnea or palpitations No fever or COVID contacts  Daughter checks in on him weekly and there is a cafe in [place] he can pick up food    Hospitalized [id number] with COVID complicated by dehydration falls and new onset afib  Rx with lopressor and eliqus CHADVASC 3. Rate control good Plavix d/c due to  Need for anticoagulation had positive d dimer likely from COVID 05/16/21 LE duplex No DVT CXR mild vascular congestion ? Left increased interstitial markings pneumonitis   TTE 05/14/21 EF 50-55% moderate LAE trivial MR  Doing ok but lazy Getting home PT Legs weak  Eliqusi expensive in donut hole Asking about patient assistance  ECG today shows NSR with PVC rate 63    Past Medical History:  Diagnosis Date   Basal cell carcinoma 06/02/2003   left bulb nose-CX35FU   CAD    HYPERLIPIDEMIA    HYPERTENSION    Melanoma ([place]) 06/05/2003   left cheek-MOHS   Shortness of breath    Past Surgical History:  Procedure Laterality Date   APPENDECTOMY     CORONARY ARTERY BYPASS GRAFT     KIDNEY STONE SURGERY     REFRACTIVE SURGERY       Current Meds  Medication Sig   apixaban (ELIQUIS) 5 MG TABS tablet Take 1 tablet (5 mg total) by mouth 2 (two) times daily.   atorvastatin (LIPITOR) 20 MG tablet Take 20 mg by mouth daily.    denosumab (PROLIA) 60  MG/ML SOLN injection Inject 60 mg into the skin every 6 (six) months. Administer in upper arm, thigh, or abdomen   Esomeprazole Magnesium (NEXIUM PO) Take 1 tablet by mouth every other day.    FEROSUL 325 (65 Fe) MG tablet Take 325 mg by mouth daily with breakfast.   latanoprost (XALATAN) 0.005 % ophthalmic solution Place 1 drop into the left eye daily.   metoprolol tartrate (LOPRESSOR) 50 MG tablet Take 1 tablet (50 mg total) by mouth 2 (two) times daily.   vitamin C (ASCORBIC ACID) 500 MG tablet Take 500 mg by mouth daily.     Allergies:   Succinylsulphathiazole, Other, and Sulfonamide derivatives   Social History   Tobacco Use   Smoking status: Former    Packs/day: 2.00    Years: 40.00    Pack years: 80.00    Types: Cigarettes   Smokeless tobacco: Never  Vaping Use   Vaping Use: Never used  Substance Use Topics   Alcohol use: No    Alcohol/week: 0.0 standard drinks    Comment: 0.5 glass of wine nightly.   Drug use: No     Family Hx: The patient's family history includes Anuerysm in his father; Coronary artery disease in his father; Healthy in his daughter; Heart attack in his brother.  ROS:  Please see the history of present illness.     All other systems reviewed and are negative.   Prior CV studies:   The following studies were reviewed today:  Holter 05/24/18 Myovue 05/04/17 Echo 05/16/21   Labs/Other Tests and Data Reviewed:    EKG:   05/14/21 afib rate 108 IVCD LAD   Recent Labs: 05/14/2021: TSH 0.877 05/17/2021: ALT 26; B Natriuretic Peptide 535.8; BUN 40; Creatinine, Ser 1.06; Hemoglobin 9.9; Magnesium 1.8; Platelets 199; Potassium 4.1; Sodium 135   Recent Lipid Panel Lab Results  Component Value Date/Time   CHOL 160 05/27/2010 12:00 PM   TRIG 96.0 05/27/2010 12:00 PM   HDL 47.30 05/27/2010 12:00 PM   CHOLHDL 3 05/27/2010 12:00 PM   LDLCALC 94 05/27/2010 12:00 PM    Wt Readings from Last 3 Encounters:  07/02/21 78.5 kg  05/13/21 81.6 kg  01/02/21 81.6  kg     Objective:    Vital Signs:  BP 134/78   Pulse 60   Ht '5\' 6"'$  (1.676 m)   Wt 78.5 kg   SpO2 99%   BMI 27.92 kg/m    Affect appropriate Elderly kyphotic white male  HEENT: normal Neck supple with no adenopathy JVP normal no bruits no thyromegaly Lungs clear with no wheezing and good diaphragmatic motion Heart:  S1/S2 no murmur, no rub, gallop or click PMI normal Abdomen: benighn, BS positve, no tenderness, no AAA no bruit.  No HSM or HJR Distal pulses intact with no bruits No edema Neuro non-focal Skin warm and dry No muscular weakness   ASSESSMENT & PLAN:    ASSESSMENT AND PLAN:    1. Syncope: based on history surrounding event, suspect likely vasovagal syncope.  Holter 05/24/18 no significant arrhythmia   2. CAD: h/o CABG in 1999.  Myovue  04/2017 showed normal EF and no ischemia. He denies any CP and no dyspnea. Continue medical therapy.   3. HLD:  continue statin labs with primary   4. Afib:  precipitated by COVID and dehydration Rate control with lopressor and on eliquis TTE benign normal EF no significant valve disease moderate LAE Back in NSR today will forward note to [place] [address] to look into patient assistance   5. COVID:  sats ok CXR just mild left pneumonitis improved     COVID-19 Education: The signs and symptoms of COVID-19 were discussed with the patient and how to seek care for testing (follow up with PCP or arrange E-visit).  The importance of social distancing was discussed today.   Medication Adjustments/Labs and Tests Ordered: Current medicines are reviewed at length with the patient today.  Concerns regarding medicines are outlined above.  Tests Ordered: No orders of the defined types were placed in this encounter.  Medication Changes: No orders of the defined types were placed in this encounter.   Disposition:  Follow up  in 6 months  Signed, [name], MD  07/02/2021 3:49 PM    [place]

## 2021-07-02 ENCOUNTER — Encounter: Payer: Self-pay | Admitting: Cardiovascular Disease

## 2021-07-02 ENCOUNTER — Other Ambulatory Visit: Payer: Self-pay

## 2021-07-02 ENCOUNTER — Ambulatory Visit (INDEPENDENT_AMBULATORY_CARE_PROVIDER_SITE_OTHER): Payer: Medicare Other | Admitting: Cardiovascular Disease

## 2021-07-02 VITALS — BP 134/78 | HR 60 | Ht 66.0 in | Wt 173.0 lb

## 2021-07-02 DIAGNOSIS — I2583 Coronary atherosclerosis due to lipid rich plaque: Secondary | ICD-10-CM | POA: Diagnosis not present

## 2021-07-02 DIAGNOSIS — I480 Paroxysmal atrial fibrillation: Secondary | ICD-10-CM | POA: Diagnosis not present

## 2021-07-02 DIAGNOSIS — I2510 Atherosclerotic heart disease of native coronary artery without angina pectoris: Secondary | ICD-10-CM | POA: Diagnosis not present

## 2021-07-02 DIAGNOSIS — I25810 Atherosclerosis of coronary artery bypass graft(s) without angina pectoris: Secondary | ICD-10-CM | POA: Diagnosis not present

## 2021-07-02 NOTE — Patient Instructions (Signed)
Medication Instructions:  *If you need a refill on your cardiac medications before your next appointment, please call your pharmacy*   Lab Work: If you have labs (blood work) drawn today and your tests are completely normal, you will receive your results only by: MyChart Message (if you have MyChart) OR A paper copy in the mail If you have any lab test that is abnormal or we need to change your treatment, we will call you to review the results.  Follow-Up: At [facility], you and your health needs are our priority.  As part of our continuing mission to provide you with exceptional heart care, we have created designated Provider Care Teams.  These Care Teams include your primary Cardiologist (physician) and Advanced Practice Providers (APPs -  Physician Assistants and Nurse Practitioners) who all work together to provide you with the care you need, when you need it.  We recommend signing up for the patient portal called "MyChart".  Sign up information is provided on this After Visit Summary.  MyChart is used to connect with patients for Virtual Visits (Telemedicine).  Patients are able to view lab/test results, encounter notes, upcoming appointments, etc.  Non-urgent messages can be sent to your provider as well.   To learn more about what you can do with MyChart, go to [email or web address].    Your next appointment:   6 month(s)  The format for your next appointment:   In Person  Provider:   You may see Peter Nishan, MD or one of the following Advanced Practice Providers on your designated Care Team:   Laura Ingold, NP  

## 2021-07-03 DIAGNOSIS — I10 Essential (primary) hypertension: Secondary | ICD-10-CM | POA: Diagnosis not present

## 2021-07-03 DIAGNOSIS — U071 COVID-19: Secondary | ICD-10-CM | POA: Diagnosis not present

## 2021-07-03 DIAGNOSIS — N179 Acute kidney failure, unspecified: Secondary | ICD-10-CM | POA: Diagnosis not present

## 2021-07-03 DIAGNOSIS — I2510 Atherosclerotic heart disease of native coronary artery without angina pectoris: Secondary | ICD-10-CM | POA: Diagnosis not present

## 2021-07-03 DIAGNOSIS — E860 Dehydration: Secondary | ICD-10-CM | POA: Diagnosis not present

## 2021-07-03 DIAGNOSIS — E785 Hyperlipidemia, unspecified: Secondary | ICD-10-CM | POA: Diagnosis not present

## 2021-07-04 ENCOUNTER — Telehealth: Payer: Self-pay

## 2021-07-04 NOTE — Telephone Encounter (Signed)
**Note De-Identified Hunner Garcon Obfuscation** Per Dr Kyla Balzarine request I called the pts daughter Butch Penny [facility]) to discuss Pt Asst through [facility] for the pts Eliquis. No answer so I left a message on her VM asking her to call Jeani Hawking back at Dr Kyla Balzarine office at [phone].

## 2021-07-04 NOTE — Telephone Encounter (Signed)
**Note De-Identified Shyheim Tanney Obfuscation** Butch Penny and I discussed pt asst for Eliquis through [facility] and she is interested.  I gave her their phone number and advised her to call them to ask questions about their Eliquis program and the pts eligibility to be approved for the program.  She is aware that she can either request that they mail her an application or to print an application off line.  She is also aware that once she receive the application to complete the pts part, btain required documents per [employer]MSPA[employer], and to bring all to Dr Mariana Arn office at [facility] on [address] in [place] to drop off in the front office and that we will take care of the providers page of his application and will fax all to [facility]  She is also aware to call us back if the pt is not eligible for approval into the BMSPAF Eliquis program and cannot afford his Eliquis.   She thanked me for calling her to discuss pt asst for the pts Elquis.

## 2021-08-06 DIAGNOSIS — I119 Hypertensive heart disease without heart failure: Secondary | ICD-10-CM | POA: Diagnosis not present

## 2021-08-06 DIAGNOSIS — H9193 Unspecified hearing loss, bilateral: Secondary | ICD-10-CM | POA: Diagnosis not present

## 2021-08-06 DIAGNOSIS — D6869 Other thrombophilia: Secondary | ICD-10-CM | POA: Diagnosis not present

## 2021-08-06 DIAGNOSIS — E782 Mixed hyperlipidemia: Secondary | ICD-10-CM | POA: Diagnosis not present

## 2021-08-06 DIAGNOSIS — M8000XA Age-related osteoporosis with current pathological fracture, unspecified site, initial encounter for fracture: Secondary | ICD-10-CM | POA: Diagnosis not present

## 2021-08-06 DIAGNOSIS — Z0000 Encounter for general adult medical examination without abnormal findings: Secondary | ICD-10-CM | POA: Diagnosis not present

## 2021-08-06 DIAGNOSIS — I4891 Unspecified atrial fibrillation: Secondary | ICD-10-CM | POA: Diagnosis not present

## 2021-08-06 DIAGNOSIS — I2510 Atherosclerotic heart disease of native coronary artery without angina pectoris: Secondary | ICD-10-CM | POA: Diagnosis not present

## 2021-08-06 DIAGNOSIS — Z23 Encounter for immunization: Secondary | ICD-10-CM | POA: Diagnosis not present

## 2021-12-30 NOTE — Progress Notes (Unsigned)
Evaluation Performed:  Follow-up visit  Date:  12/30/2021   ID:  [name], DOB 11-Oct-1929, MRN [id number]  Provider Location: Office  PCP:  [name], MD  Cardiologist:  [name], MD [facility] Electrophysiologist:  None   Chief Complaint:  CAD/CABG  History of Present Illness:    HPI: 88 y.o. CAD/ CABG in 1999 last myovue 2018 no ischemia. Seen by PA in July 2019  for "syncope" thought to be vasovagal Holter done 05/24/18 no significant arrhythmia EF 48% on myovue June 2018   His syncope episodes are precipitated by pain in his jaw/teeth when Eating Denies TMJ or chronic dental issues.    No chest pain , postural symptoms dyspnea or palpitations No fever or COVID contacts  Daughter checks in on him weekly and there is a cafe in [place] he can pick up food    No chest pain   Doing well just hard of hearing   Admitted with COVID 05/17/21 Rx hydration, Remdesivir and steroids Noted to have new onset afib Started on eliquis despite history of falls , poor balance using walker and age D/c to SNF with PT/OT Echo 05/14/21 EF 50-55% Moderate LAE trivial MR and AV sclerosis Was in NSR when seen in office 07/02/21   ***  Past Medical History:  Diagnosis Date   Basal cell carcinoma 06/02/2003   left bulb nose-CX35FU   CAD    HYPERLIPIDEMIA    HYPERTENSION    Melanoma ([place]) 06/05/2003   left cheek-MOHS   Shortness of breath    Past Surgical History:  Procedure Laterality Date   APPENDECTOMY     CORONARY ARTERY BYPASS GRAFT     KIDNEY STONE SURGERY     REFRACTIVE SURGERY       No outpatient medications have been marked as taking for the 01/02/22 encounter (Appointment) with Josue Hector, MD.     Allergies:   Succinylsulphathiazole, Other, and Sulfonamide derivatives   Social History   Tobacco Use   Smoking status: Former    Packs/day: 2.00    Years: 40.00    Pack years: 80.00    Types: Cigarettes   Smokeless tobacco: Never  Vaping Use   Vaping Use:  Never used  Substance Use Topics   Alcohol use: No    Alcohol/week: 0.0 standard drinks    Comment: 0.5 glass of wine nightly.   Drug use: No     Family Hx: The patient's family history includes Anuerysm in his father; Coronary artery disease in his father; Healthy in his daughter; Heart attack in his brother.  ROS:   Please see the history of present illness.     All other systems reviewed and are negative.   Prior CV studies:   The following studies were reviewed today:  Holter 05/24/18 Myovue 05/04/17 Echo 11/18/11  Labs/Other Tests and Data Reviewed:    EKG:   07/02/21 SR LAD PAC   Recent Labs: 05/14/2021: TSH 0.877 05/17/2021: ALT 26; B Natriuretic Peptide 535.8; BUN 40; Creatinine, Ser 1.06; Hemoglobin 9.9; Magnesium 1.8; Platelets 199; Potassium 4.1; Sodium 135   Recent Lipid Panel Lab Results  Component Value Date/Time   CHOL 160 05/27/2010 12:00 PM   TRIG 96.0 05/27/2010 12:00 PM   HDL 47.30 05/27/2010 12:00 PM   CHOLHDL 3 05/27/2010 12:00 PM   LDLCALC 94 05/27/2010 12:00 PM    Wt Readings from Last 3 Encounters:  07/02/21 173 lb (78.5 kg)  05/13/21 180 lb (81.6 kg)  01/02/21 180  lb (81.6 kg)     Objective:    Vital Signs:  There were no vitals taken for this visit.   Affect appropriate Elderly kyphotic white male  HEENT: normal Neck supple with no adenopathy JVP normal no bruits no thyromegaly Lungs clear with no wheezing and good diaphragmatic motion Heart:  S1/S2 no murmur, no rub, gallop or click PMI normal Abdomen: benighn, BS positve, no tenderness, no AAA no bruit.  No HSM or HJR Distal pulses intact with no bruits No edema Neuro non-focal Skin warm and dry No muscular weakness   ASSESSMENT & PLAN:    ASSESSMENT AND PLAN:    1. Syncope: based on history surrounding event, suspect likely vasovagal syncope.  Holter 05/24/18 no significant arrhythmia   2. CAD: h/o CABG in 1999.  Myovue  04/2017 showed normal EF and no ischemia. He denies  any CP and no dyspnea. Continue medical therapy.    3. PAF: Diagnosed with COVID July 2022 Eliquis and lopressor CHADVASC 3    4. HLD:  continue statin LDL 95 LFTls normal    Medication Adjustments/Labs and Tests Ordered: Current medicines are reviewed at length with the patient today.  Concerns regarding medicines are outlined above.  Tests Ordered: No orders of the defined types were placed in this encounter.  Medication Changes: No orders of the defined types were placed in this encounter.   Disposition:  Follow up  in 6 months  Signed, [name], MD  12/30/2021 1:16 PM    [facility]

## 2022-01-02 ENCOUNTER — Ambulatory Visit: Payer: Medicare Other | Admitting: Cardiovascular Disease

## 2022-01-18 ENCOUNTER — Other Ambulatory Visit: Payer: Self-pay | Admitting: Cardiovascular Disease

## 2022-01-19 NOTE — Telephone Encounter (Signed)
Prescription refill request for Eliquis received. ?Indication: afib  ?Last office visit: 07/02/2021 ?Scr: 1.10, 08/06/2021 ?Age:  88 yo  ?Weight: 78.5 kg  ? ?Refill sent.  ?

## 2022-02-05 DIAGNOSIS — R2681 Unsteadiness on feet: Secondary | ICD-10-CM | POA: Diagnosis not present

## 2022-02-05 DIAGNOSIS — I119 Hypertensive heart disease without heart failure: Secondary | ICD-10-CM | POA: Diagnosis not present

## 2022-02-05 DIAGNOSIS — M810 Age-related osteoporosis without current pathological fracture: Secondary | ICD-10-CM | POA: Diagnosis not present

## 2022-02-05 DIAGNOSIS — I4891 Unspecified atrial fibrillation: Secondary | ICD-10-CM | POA: Diagnosis not present

## 2022-02-05 DIAGNOSIS — D509 Iron deficiency anemia, unspecified: Secondary | ICD-10-CM | POA: Diagnosis not present

## 2022-02-05 DIAGNOSIS — E782 Mixed hyperlipidemia: Secondary | ICD-10-CM | POA: Diagnosis not present

## 2022-02-05 DIAGNOSIS — D6869 Other thrombophilia: Secondary | ICD-10-CM | POA: Diagnosis not present

## 2022-02-05 DIAGNOSIS — I2510 Atherosclerotic heart disease of native coronary artery without angina pectoris: Secondary | ICD-10-CM | POA: Diagnosis not present

## 2022-03-23 NOTE — Progress Notes (Signed)
?  ? ?Evaluation Performed:  Follow-up visit ? ?Date:  03/25/2022  ? ?ID:  [name], DOB 10-Nov-1928, MRN [id number] ? ?Provider Location: Office ? ?PCP:  [name], MD  ?Cardiologist:  [name], MD [facility] ?Electrophysiologist:  None  ? ?Chief Complaint:  CAD/CABG ? ?History of Present Illness:   ? ?HPI: 88 y.o. CAD/ CABG in 1999 last myovue 2018 no ischemia. Seen by PA in July 2019  for "syncope" thought to be vasovagal Holter done 05/24/18 no significant arrhythmia EF 48% on myovue June 2018 ?  ?His syncope episodes are precipitated by pain in his jaw/teeth when ?Eating Denies TMJ or chronic dental issues.  ?  ?No chest pain , postural symptoms dyspnea or palpitations No fever or COVID contacts ? ?Daughter checks in on him weekly and there is a cafe in [place] he can pick up food   ? ?Hospitalized 07/16/97 with COVID complicated by dehydration falls and new onset afib  ?Rx with lopressor and eliqus CHADVASC 3. Rate control good Plavix d/c due to  ?Need for anticoagulation had positive d dimer likely from COVID 05/16/21 LE duplex ?No DVT CXR mild vascular congestion ? Left increased interstitial markings pneumonitis  ? ?TTE 05/14/21 EF 50-55% moderate LAE trivial MR ? ?Doing ok but lazy Getting home PT Legs weak  ?Eliqusi expensive in donut hole Asking about patient assistance ? ?ECG today shows NSR with PVC rate 63  ? ? ?Past Medical History:  ?Diagnosis Date  ? Basal cell carcinoma 06/02/2003  ? left bulb nose-CX35FU  ? CAD   ? HYPERLIPIDEMIA   ? HYPERTENSION   ? Melanoma ([place]) 06/05/2003  ? left cheek-MOHS  ? Shortness of breath   ? ?Past Surgical History:  ?Procedure Laterality Date  ? APPENDECTOMY    ? CORONARY ARTERY BYPASS GRAFT    ? KIDNEY STONE SURGERY    ? REFRACTIVE SURGERY    ?  ? ?Current Meds  ?Medication Sig  ? apixaban (ELIQUIS) 5 MG TABS tablet TAKE 1 TABLET BY MOUTH TWICE A DAY  ? atorvastatin (LIPITOR) 20 MG tablet Take 20 mg by mouth daily.   ? denosumab (PROLIA) 60 MG/ML SOLN injection  Inject 60 mg into the skin every 6 (six) months. Administer in upper arm, thigh, or abdomen  ? Esomeprazole Magnesium (NEXIUM PO) Take 1 tablet by mouth daily as needed (acid reflux).  ? latanoprost (XALATAN) 0.005 % ophthalmic solution Place 1 drop into the left eye daily.  ? metoprolol tartrate (LOPRESSOR) 25 MG tablet Take 25 mg by mouth 2 (two) times daily.  ? vitamin C (ASCORBIC ACID) 500 MG tablet Take 500 mg by mouth daily.  ?  ? ?Allergies:   Succinylsulphathiazole, Other, and Sulfonamide derivatives  ? ?Social History  ? ?Tobacco Use  ? Smoking status: Former  ?  Packs/day: 2.00  ?  Years: 40.00  ?  Pack years: 80.00  ?  Types: Cigarettes  ? Smokeless tobacco: Never  ?Vaping Use  ? Vaping Use: Never used  ?Substance Use Topics  ? Alcohol use: No  ?  Alcohol/week: 0.0 standard drinks  ?  Comment: 0.5 glass of wine nightly.  ? Drug use: No  ?  ? ?Family Hx: ?The patient's family history includes Anuerysm in his father; Coronary artery disease in his father; Healthy in his daughter; Heart attack in his brother. ? ?ROS:   ?Please see the history of present illness.    ? ?All other systems reviewed and are negative. ? ? ?  Prior CV studies:   ?The following studies were reviewed today: ? ?Holter 05/24/18 ?Myovue 05/04/17 ?Echo 05/16/21  ? ?Labs/Other Tests and Data Reviewed:   ? ?EKG:   05/14/21 afib rate 108 IVCD LAD  ? ?Recent Labs: ?05/14/2021: TSH 0.877 ?05/17/2021: ALT 26; B Natriuretic Peptide 535.8; BUN 40; Creatinine, Ser 1.06; Hemoglobin 9.9; Magnesium 1.8; Platelets 199; Potassium 4.1; Sodium 135  ? ?Recent Lipid Panel ?Lab Results  ?Component Value Date/Time  ? CHOL 160 05/27/2010 12:00 PM  ? TRIG 96.0 05/27/2010 12:00 PM  ? HDL 47.30 05/27/2010 12:00 PM  ? CHOLHDL 3 05/27/2010 12:00 PM  ? LDLCALC 94 05/27/2010 12:00 PM  ? ? ?Wt Readings from Last 3 Encounters:  ?03/25/22 180 lb 9.6 oz (81.9 kg)  ?07/02/21 173 lb (78.5 kg)  ?05/13/21 180 lb (81.6 kg)  ?  ? ?Objective:   ? ?Vital Signs:  BP (!) 148/62   Pulse (!)  53   Ht '5\' 6"'$  (1.676 m)   Wt 180 lb 9.6 oz (81.9 kg)   SpO2 93%   BMI 29.15 kg/m?   ? ?Affect appropriate ?Elderly kyphotic white male  ?HEENT: normal ?Neck supple with no adenopathy ?JVP normal no bruits no thyromegaly ?Lungs clear with no wheezing and good diaphragmatic motion ?Heart:  S1/S2 no murmur, no rub, gallop or click ?PMI normal ?Abdomen: benighn, BS positve, no tenderness, no AAA ?no bruit.  No HSM or HJR ?Distal pulses intact with no bruits ?No edema ?Neuro non-focal ?Skin warm and dry ?No muscular weakness ? ? ?ASSESSMENT & PLAN:   ? ?ASSESSMENT AND PLAN:  ?  ?1. Syncope: based on history surrounding event, suspect likely vasovagal syncope.  Holter 05/24/18 no significant arrhythmia ?  ?2. CAD: h/o CABG in 1999.  Myovue  04/2017 showed normal EF and no ischemia. He denies any CP and no dyspnea. Continue medical therapy. Given age and sedentary status no stress testing indicated  ? ?3. HLD:  continue statin labs with primary  ? ?4. Afib:  precipitated by COVID and dehydration Rate control with lopressor and on eliquis TTE benign normal EF no significant valve disease moderate LAE Back in NSR today Patient assistance for for eliquis  ? ? ? ?Medication Adjustments/Labs and Tests Ordered: ?Current medicines are reviewed at length with the patient today.  Concerns regarding medicines are outlined above.  ?Tests Ordered: ?No orders of the defined types were placed in this encounter. ? ? ?Medication Changes: ?No orders of the defined types were placed in this encounter. ? ? ? ?Disposition:  Follow up  in 6 months ? ?Signed, ?[name], MD  ?03/25/2022 10:17 AM    ?[place] ?

## 2022-03-25 ENCOUNTER — Encounter: Payer: Self-pay | Admitting: Cardiovascular Disease

## 2022-03-25 ENCOUNTER — Ambulatory Visit (INDEPENDENT_AMBULATORY_CARE_PROVIDER_SITE_OTHER): Payer: Medicare Other | Admitting: Cardiovascular Disease

## 2022-03-25 VITALS — BP 148/62 | HR 53 | Ht 66.0 in | Wt 180.6 lb

## 2022-03-25 DIAGNOSIS — I480 Paroxysmal atrial fibrillation: Secondary | ICD-10-CM | POA: Diagnosis not present

## 2022-03-25 DIAGNOSIS — I25810 Atherosclerosis of coronary artery bypass graft(s) without angina pectoris: Secondary | ICD-10-CM | POA: Diagnosis not present

## 2022-03-25 NOTE — Patient Instructions (Signed)

## 2022-07-29 ENCOUNTER — Other Ambulatory Visit: Payer: Self-pay | Admitting: Cardiovascular Disease

## 2022-07-30 NOTE — Telephone Encounter (Signed)
Prescription refill request for Eliquis received. Indication: Afib Last office visit: Nishan 03/25/2022 Scr: 1.03, 02/05/2022 Age: 88 yo  Weight: 81.9 kg   Pt is on the correct dose of Eliquis. Refill sent.

## 2022-08-12 ENCOUNTER — Ambulatory Visit: Payer: Medicare Other | Admitting: Dermatology

## 2022-09-01 DIAGNOSIS — I2510 Atherosclerotic heart disease of native coronary artery without angina pectoris: Secondary | ICD-10-CM | POA: Diagnosis not present

## 2022-09-01 DIAGNOSIS — H9193 Unspecified hearing loss, bilateral: Secondary | ICD-10-CM | POA: Diagnosis not present

## 2022-09-01 DIAGNOSIS — Z0000 Encounter for general adult medical examination without abnormal findings: Secondary | ICD-10-CM | POA: Diagnosis not present

## 2022-09-01 DIAGNOSIS — I4891 Unspecified atrial fibrillation: Secondary | ICD-10-CM | POA: Diagnosis not present

## 2022-09-01 DIAGNOSIS — R809 Proteinuria, unspecified: Secondary | ICD-10-CM | POA: Diagnosis not present

## 2022-09-01 DIAGNOSIS — D6869 Other thrombophilia: Secondary | ICD-10-CM | POA: Diagnosis not present

## 2022-09-01 DIAGNOSIS — I119 Hypertensive heart disease without heart failure: Secondary | ICD-10-CM | POA: Diagnosis not present

## 2022-09-01 DIAGNOSIS — E782 Mixed hyperlipidemia: Secondary | ICD-10-CM | POA: Diagnosis not present

## 2022-09-01 DIAGNOSIS — M8000XA Age-related osteoporosis with current pathological fracture, unspecified site, initial encounter for fracture: Secondary | ICD-10-CM | POA: Diagnosis not present

## 2022-09-01 DIAGNOSIS — Z23 Encounter for immunization: Secondary | ICD-10-CM | POA: Diagnosis not present

## 2022-09-17 DIAGNOSIS — H33051 Total retinal detachment, right eye: Secondary | ICD-10-CM | POA: Diagnosis not present

## 2022-09-17 DIAGNOSIS — H35351 Cystoid macular degeneration, right eye: Secondary | ICD-10-CM | POA: Diagnosis not present

## 2022-09-17 DIAGNOSIS — H401121 Primary open-angle glaucoma, left eye, mild stage: Secondary | ICD-10-CM | POA: Diagnosis not present

## 2022-09-28 ENCOUNTER — Ambulatory Visit: Payer: Medicare Other | Admitting: Cardiovascular Disease

## 2022-11-26 DIAGNOSIS — M25569 Pain in unspecified knee: Secondary | ICD-10-CM | POA: Diagnosis not present

## 2022-11-26 DIAGNOSIS — R2681 Unsteadiness on feet: Secondary | ICD-10-CM | POA: Diagnosis not present

## 2022-12-07 DIAGNOSIS — M810 Age-related osteoporosis without current pathological fracture: Secondary | ICD-10-CM | POA: Diagnosis not present

## 2022-12-07 DIAGNOSIS — I2510 Atherosclerotic heart disease of native coronary artery without angina pectoris: Secondary | ICD-10-CM | POA: Diagnosis not present

## 2022-12-07 DIAGNOSIS — K458 Other specified abdominal hernia without obstruction or gangrene: Secondary | ICD-10-CM | POA: Diagnosis not present

## 2022-12-07 DIAGNOSIS — I4891 Unspecified atrial fibrillation: Secondary | ICD-10-CM | POA: Diagnosis not present

## 2022-12-07 DIAGNOSIS — Z951 Presence of aortocoronary bypass graft: Secondary | ICD-10-CM | POA: Diagnosis not present

## 2022-12-07 DIAGNOSIS — H9193 Unspecified hearing loss, bilateral: Secondary | ICD-10-CM | POA: Diagnosis not present

## 2022-12-07 DIAGNOSIS — N179 Acute kidney failure, unspecified: Secondary | ICD-10-CM | POA: Diagnosis not present

## 2022-12-07 DIAGNOSIS — L409 Psoriasis, unspecified: Secondary | ICD-10-CM | POA: Diagnosis not present

## 2022-12-07 DIAGNOSIS — M1711 Unilateral primary osteoarthritis, right knee: Secondary | ICD-10-CM | POA: Diagnosis not present

## 2022-12-07 DIAGNOSIS — D509 Iron deficiency anemia, unspecified: Secondary | ICD-10-CM | POA: Diagnosis not present

## 2022-12-07 DIAGNOSIS — U071 COVID-19: Secondary | ICD-10-CM | POA: Diagnosis not present

## 2022-12-07 DIAGNOSIS — K219 Gastro-esophageal reflux disease without esophagitis: Secondary | ICD-10-CM | POA: Diagnosis not present

## 2022-12-07 DIAGNOSIS — Z556 Problems related to health literacy: Secondary | ICD-10-CM | POA: Diagnosis not present

## 2022-12-07 DIAGNOSIS — Z7901 Long term (current) use of anticoagulants: Secondary | ICD-10-CM | POA: Diagnosis not present

## 2022-12-07 DIAGNOSIS — D692 Other nonthrombocytopenic purpura: Secondary | ICD-10-CM | POA: Diagnosis not present

## 2022-12-07 DIAGNOSIS — E860 Dehydration: Secondary | ICD-10-CM | POA: Diagnosis not present

## 2022-12-07 DIAGNOSIS — Z87891 Personal history of nicotine dependence: Secondary | ICD-10-CM | POA: Diagnosis not present

## 2022-12-07 DIAGNOSIS — D6869 Other thrombophilia: Secondary | ICD-10-CM | POA: Diagnosis not present

## 2022-12-07 DIAGNOSIS — E782 Mixed hyperlipidemia: Secondary | ICD-10-CM | POA: Diagnosis not present

## 2022-12-07 DIAGNOSIS — R2681 Unsteadiness on feet: Secondary | ICD-10-CM | POA: Diagnosis not present

## 2022-12-07 DIAGNOSIS — I1310 Hypertensive heart and chronic kidney disease without heart failure, with stage 1 through stage 4 chronic kidney disease, or unspecified chronic kidney disease: Secondary | ICD-10-CM | POA: Diagnosis not present

## 2022-12-07 DIAGNOSIS — N182 Chronic kidney disease, stage 2 (mild): Secondary | ICD-10-CM | POA: Diagnosis not present

## 2022-12-07 DIAGNOSIS — Z8616 Personal history of COVID-19: Secondary | ICD-10-CM | POA: Diagnosis not present

## 2022-12-10 DIAGNOSIS — M1711 Unilateral primary osteoarthritis, right knee: Secondary | ICD-10-CM | POA: Diagnosis not present

## 2022-12-10 DIAGNOSIS — N179 Acute kidney failure, unspecified: Secondary | ICD-10-CM | POA: Diagnosis not present

## 2022-12-10 DIAGNOSIS — D6869 Other thrombophilia: Secondary | ICD-10-CM | POA: Diagnosis not present

## 2022-12-10 DIAGNOSIS — I4891 Unspecified atrial fibrillation: Secondary | ICD-10-CM | POA: Diagnosis not present

## 2022-12-10 DIAGNOSIS — I1310 Hypertensive heart and chronic kidney disease without heart failure, with stage 1 through stage 4 chronic kidney disease, or unspecified chronic kidney disease: Secondary | ICD-10-CM | POA: Diagnosis not present

## 2022-12-10 DIAGNOSIS — D692 Other nonthrombocytopenic purpura: Secondary | ICD-10-CM | POA: Diagnosis not present

## 2022-12-11 DIAGNOSIS — I1310 Hypertensive heart and chronic kidney disease without heart failure, with stage 1 through stage 4 chronic kidney disease, or unspecified chronic kidney disease: Secondary | ICD-10-CM | POA: Diagnosis not present

## 2022-12-11 DIAGNOSIS — I4891 Unspecified atrial fibrillation: Secondary | ICD-10-CM | POA: Diagnosis not present

## 2022-12-11 DIAGNOSIS — D6869 Other thrombophilia: Secondary | ICD-10-CM | POA: Diagnosis not present

## 2022-12-11 DIAGNOSIS — D692 Other nonthrombocytopenic purpura: Secondary | ICD-10-CM | POA: Diagnosis not present

## 2022-12-11 DIAGNOSIS — M1711 Unilateral primary osteoarthritis, right knee: Secondary | ICD-10-CM | POA: Diagnosis not present

## 2022-12-11 DIAGNOSIS — N179 Acute kidney failure, unspecified: Secondary | ICD-10-CM | POA: Diagnosis not present

## 2022-12-14 DIAGNOSIS — D6869 Other thrombophilia: Secondary | ICD-10-CM | POA: Diagnosis not present

## 2022-12-14 DIAGNOSIS — I1310 Hypertensive heart and chronic kidney disease without heart failure, with stage 1 through stage 4 chronic kidney disease, or unspecified chronic kidney disease: Secondary | ICD-10-CM | POA: Diagnosis not present

## 2022-12-14 DIAGNOSIS — M1711 Unilateral primary osteoarthritis, right knee: Secondary | ICD-10-CM | POA: Diagnosis not present

## 2022-12-14 DIAGNOSIS — D692 Other nonthrombocytopenic purpura: Secondary | ICD-10-CM | POA: Diagnosis not present

## 2022-12-14 DIAGNOSIS — I4891 Unspecified atrial fibrillation: Secondary | ICD-10-CM | POA: Diagnosis not present

## 2022-12-14 DIAGNOSIS — N179 Acute kidney failure, unspecified: Secondary | ICD-10-CM | POA: Diagnosis not present

## 2022-12-17 DIAGNOSIS — I1310 Hypertensive heart and chronic kidney disease without heart failure, with stage 1 through stage 4 chronic kidney disease, or unspecified chronic kidney disease: Secondary | ICD-10-CM | POA: Diagnosis not present

## 2022-12-17 DIAGNOSIS — I4891 Unspecified atrial fibrillation: Secondary | ICD-10-CM | POA: Diagnosis not present

## 2022-12-17 DIAGNOSIS — M1711 Unilateral primary osteoarthritis, right knee: Secondary | ICD-10-CM | POA: Diagnosis not present

## 2022-12-17 DIAGNOSIS — N179 Acute kidney failure, unspecified: Secondary | ICD-10-CM | POA: Diagnosis not present

## 2022-12-17 DIAGNOSIS — D6869 Other thrombophilia: Secondary | ICD-10-CM | POA: Diagnosis not present

## 2022-12-17 DIAGNOSIS — D692 Other nonthrombocytopenic purpura: Secondary | ICD-10-CM | POA: Diagnosis not present

## 2022-12-21 DIAGNOSIS — D6869 Other thrombophilia: Secondary | ICD-10-CM | POA: Diagnosis not present

## 2022-12-21 DIAGNOSIS — M1711 Unilateral primary osteoarthritis, right knee: Secondary | ICD-10-CM | POA: Diagnosis not present

## 2022-12-21 DIAGNOSIS — N179 Acute kidney failure, unspecified: Secondary | ICD-10-CM | POA: Diagnosis not present

## 2022-12-21 DIAGNOSIS — I4891 Unspecified atrial fibrillation: Secondary | ICD-10-CM | POA: Diagnosis not present

## 2022-12-21 DIAGNOSIS — D692 Other nonthrombocytopenic purpura: Secondary | ICD-10-CM | POA: Diagnosis not present

## 2022-12-21 DIAGNOSIS — I1310 Hypertensive heart and chronic kidney disease without heart failure, with stage 1 through stage 4 chronic kidney disease, or unspecified chronic kidney disease: Secondary | ICD-10-CM | POA: Diagnosis not present

## 2022-12-24 DIAGNOSIS — N179 Acute kidney failure, unspecified: Secondary | ICD-10-CM | POA: Diagnosis not present

## 2022-12-24 DIAGNOSIS — D6869 Other thrombophilia: Secondary | ICD-10-CM | POA: Diagnosis not present

## 2022-12-24 DIAGNOSIS — I4891 Unspecified atrial fibrillation: Secondary | ICD-10-CM | POA: Diagnosis not present

## 2022-12-24 DIAGNOSIS — M1711 Unilateral primary osteoarthritis, right knee: Secondary | ICD-10-CM | POA: Diagnosis not present

## 2022-12-24 DIAGNOSIS — I1310 Hypertensive heart and chronic kidney disease without heart failure, with stage 1 through stage 4 chronic kidney disease, or unspecified chronic kidney disease: Secondary | ICD-10-CM | POA: Diagnosis not present

## 2022-12-24 DIAGNOSIS — D692 Other nonthrombocytopenic purpura: Secondary | ICD-10-CM | POA: Diagnosis not present

## 2022-12-28 DIAGNOSIS — M1711 Unilateral primary osteoarthritis, right knee: Secondary | ICD-10-CM | POA: Diagnosis not present

## 2022-12-28 DIAGNOSIS — N179 Acute kidney failure, unspecified: Secondary | ICD-10-CM | POA: Diagnosis not present

## 2022-12-28 DIAGNOSIS — I1310 Hypertensive heart and chronic kidney disease without heart failure, with stage 1 through stage 4 chronic kidney disease, or unspecified chronic kidney disease: Secondary | ICD-10-CM | POA: Diagnosis not present

## 2022-12-28 DIAGNOSIS — D6869 Other thrombophilia: Secondary | ICD-10-CM | POA: Diagnosis not present

## 2022-12-28 DIAGNOSIS — I4891 Unspecified atrial fibrillation: Secondary | ICD-10-CM | POA: Diagnosis not present

## 2022-12-28 DIAGNOSIS — D692 Other nonthrombocytopenic purpura: Secondary | ICD-10-CM | POA: Diagnosis not present

## 2022-12-31 DIAGNOSIS — D692 Other nonthrombocytopenic purpura: Secondary | ICD-10-CM | POA: Diagnosis not present

## 2022-12-31 DIAGNOSIS — M1711 Unilateral primary osteoarthritis, right knee: Secondary | ICD-10-CM | POA: Diagnosis not present

## 2022-12-31 DIAGNOSIS — I1310 Hypertensive heart and chronic kidney disease without heart failure, with stage 1 through stage 4 chronic kidney disease, or unspecified chronic kidney disease: Secondary | ICD-10-CM | POA: Diagnosis not present

## 2022-12-31 DIAGNOSIS — D6869 Other thrombophilia: Secondary | ICD-10-CM | POA: Diagnosis not present

## 2022-12-31 DIAGNOSIS — I4891 Unspecified atrial fibrillation: Secondary | ICD-10-CM | POA: Diagnosis not present

## 2022-12-31 DIAGNOSIS — N179 Acute kidney failure, unspecified: Secondary | ICD-10-CM | POA: Diagnosis not present

## 2023-01-04 DIAGNOSIS — D692 Other nonthrombocytopenic purpura: Secondary | ICD-10-CM | POA: Diagnosis not present

## 2023-01-04 DIAGNOSIS — I1310 Hypertensive heart and chronic kidney disease without heart failure, with stage 1 through stage 4 chronic kidney disease, or unspecified chronic kidney disease: Secondary | ICD-10-CM | POA: Diagnosis not present

## 2023-01-04 DIAGNOSIS — M1711 Unilateral primary osteoarthritis, right knee: Secondary | ICD-10-CM | POA: Diagnosis not present

## 2023-01-04 DIAGNOSIS — D6869 Other thrombophilia: Secondary | ICD-10-CM | POA: Diagnosis not present

## 2023-01-04 DIAGNOSIS — I4891 Unspecified atrial fibrillation: Secondary | ICD-10-CM | POA: Diagnosis not present

## 2023-01-04 DIAGNOSIS — N179 Acute kidney failure, unspecified: Secondary | ICD-10-CM | POA: Diagnosis not present

## 2023-01-06 DIAGNOSIS — I4891 Unspecified atrial fibrillation: Secondary | ICD-10-CM | POA: Diagnosis not present

## 2023-01-06 DIAGNOSIS — M810 Age-related osteoporosis without current pathological fracture: Secondary | ICD-10-CM | POA: Diagnosis not present

## 2023-01-06 DIAGNOSIS — K219 Gastro-esophageal reflux disease without esophagitis: Secondary | ICD-10-CM | POA: Diagnosis not present

## 2023-01-06 DIAGNOSIS — Z7901 Long term (current) use of anticoagulants: Secondary | ICD-10-CM | POA: Diagnosis not present

## 2023-01-06 DIAGNOSIS — R2681 Unsteadiness on feet: Secondary | ICD-10-CM | POA: Diagnosis not present

## 2023-01-06 DIAGNOSIS — U071 COVID-19: Secondary | ICD-10-CM | POA: Diagnosis not present

## 2023-01-06 DIAGNOSIS — D692 Other nonthrombocytopenic purpura: Secondary | ICD-10-CM | POA: Diagnosis not present

## 2023-01-06 DIAGNOSIS — D6869 Other thrombophilia: Secondary | ICD-10-CM | POA: Diagnosis not present

## 2023-01-06 DIAGNOSIS — I1310 Hypertensive heart and chronic kidney disease without heart failure, with stage 1 through stage 4 chronic kidney disease, or unspecified chronic kidney disease: Secondary | ICD-10-CM | POA: Diagnosis not present

## 2023-01-06 DIAGNOSIS — D509 Iron deficiency anemia, unspecified: Secondary | ICD-10-CM | POA: Diagnosis not present

## 2023-01-06 DIAGNOSIS — Z556 Problems related to health literacy: Secondary | ICD-10-CM | POA: Diagnosis not present

## 2023-01-06 DIAGNOSIS — Z8616 Personal history of COVID-19: Secondary | ICD-10-CM | POA: Diagnosis not present

## 2023-01-06 DIAGNOSIS — H9193 Unspecified hearing loss, bilateral: Secondary | ICD-10-CM | POA: Diagnosis not present

## 2023-01-06 DIAGNOSIS — N182 Chronic kidney disease, stage 2 (mild): Secondary | ICD-10-CM | POA: Diagnosis not present

## 2023-01-06 DIAGNOSIS — I2510 Atherosclerotic heart disease of native coronary artery without angina pectoris: Secondary | ICD-10-CM | POA: Diagnosis not present

## 2023-01-06 DIAGNOSIS — K458 Other specified abdominal hernia without obstruction or gangrene: Secondary | ICD-10-CM | POA: Diagnosis not present

## 2023-01-06 DIAGNOSIS — E782 Mixed hyperlipidemia: Secondary | ICD-10-CM | POA: Diagnosis not present

## 2023-01-06 DIAGNOSIS — Z87891 Personal history of nicotine dependence: Secondary | ICD-10-CM | POA: Diagnosis not present

## 2023-01-06 DIAGNOSIS — L409 Psoriasis, unspecified: Secondary | ICD-10-CM | POA: Diagnosis not present

## 2023-01-06 DIAGNOSIS — Z951 Presence of aortocoronary bypass graft: Secondary | ICD-10-CM | POA: Diagnosis not present

## 2023-01-06 DIAGNOSIS — M1711 Unilateral primary osteoarthritis, right knee: Secondary | ICD-10-CM | POA: Diagnosis not present

## 2023-01-06 DIAGNOSIS — E860 Dehydration: Secondary | ICD-10-CM | POA: Diagnosis not present

## 2023-01-06 DIAGNOSIS — N179 Acute kidney failure, unspecified: Secondary | ICD-10-CM | POA: Diagnosis not present

## 2023-01-11 DIAGNOSIS — M1711 Unilateral primary osteoarthritis, right knee: Secondary | ICD-10-CM | POA: Diagnosis not present

## 2023-01-11 DIAGNOSIS — D6869 Other thrombophilia: Secondary | ICD-10-CM | POA: Diagnosis not present

## 2023-01-11 DIAGNOSIS — D692 Other nonthrombocytopenic purpura: Secondary | ICD-10-CM | POA: Diagnosis not present

## 2023-01-11 DIAGNOSIS — I1310 Hypertensive heart and chronic kidney disease without heart failure, with stage 1 through stage 4 chronic kidney disease, or unspecified chronic kidney disease: Secondary | ICD-10-CM | POA: Diagnosis not present

## 2023-01-11 DIAGNOSIS — I4891 Unspecified atrial fibrillation: Secondary | ICD-10-CM | POA: Diagnosis not present

## 2023-01-11 DIAGNOSIS — N179 Acute kidney failure, unspecified: Secondary | ICD-10-CM | POA: Diagnosis not present

## 2023-01-18 DIAGNOSIS — D692 Other nonthrombocytopenic purpura: Secondary | ICD-10-CM | POA: Diagnosis not present

## 2023-01-18 DIAGNOSIS — D6869 Other thrombophilia: Secondary | ICD-10-CM | POA: Diagnosis not present

## 2023-01-18 DIAGNOSIS — N179 Acute kidney failure, unspecified: Secondary | ICD-10-CM | POA: Diagnosis not present

## 2023-01-18 DIAGNOSIS — M1711 Unilateral primary osteoarthritis, right knee: Secondary | ICD-10-CM | POA: Diagnosis not present

## 2023-01-18 DIAGNOSIS — I4891 Unspecified atrial fibrillation: Secondary | ICD-10-CM | POA: Diagnosis not present

## 2023-01-18 DIAGNOSIS — I1310 Hypertensive heart and chronic kidney disease without heart failure, with stage 1 through stage 4 chronic kidney disease, or unspecified chronic kidney disease: Secondary | ICD-10-CM | POA: Diagnosis not present

## 2023-01-25 DIAGNOSIS — D692 Other nonthrombocytopenic purpura: Secondary | ICD-10-CM | POA: Diagnosis not present

## 2023-01-25 DIAGNOSIS — I4891 Unspecified atrial fibrillation: Secondary | ICD-10-CM | POA: Diagnosis not present

## 2023-01-25 DIAGNOSIS — I1310 Hypertensive heart and chronic kidney disease without heart failure, with stage 1 through stage 4 chronic kidney disease, or unspecified chronic kidney disease: Secondary | ICD-10-CM | POA: Diagnosis not present

## 2023-01-25 DIAGNOSIS — M1711 Unilateral primary osteoarthritis, right knee: Secondary | ICD-10-CM | POA: Diagnosis not present

## 2023-01-25 DIAGNOSIS — N179 Acute kidney failure, unspecified: Secondary | ICD-10-CM | POA: Diagnosis not present

## 2023-01-25 DIAGNOSIS — D6869 Other thrombophilia: Secondary | ICD-10-CM | POA: Diagnosis not present

## 2023-02-01 DIAGNOSIS — I4891 Unspecified atrial fibrillation: Secondary | ICD-10-CM | POA: Diagnosis not present

## 2023-02-01 DIAGNOSIS — M1711 Unilateral primary osteoarthritis, right knee: Secondary | ICD-10-CM | POA: Diagnosis not present

## 2023-02-01 DIAGNOSIS — I1310 Hypertensive heart and chronic kidney disease without heart failure, with stage 1 through stage 4 chronic kidney disease, or unspecified chronic kidney disease: Secondary | ICD-10-CM | POA: Diagnosis not present

## 2023-02-01 DIAGNOSIS — D6869 Other thrombophilia: Secondary | ICD-10-CM | POA: Diagnosis not present

## 2023-02-01 DIAGNOSIS — N179 Acute kidney failure, unspecified: Secondary | ICD-10-CM | POA: Diagnosis not present

## 2023-02-01 DIAGNOSIS — D692 Other nonthrombocytopenic purpura: Secondary | ICD-10-CM | POA: Diagnosis not present

## 2023-02-05 DIAGNOSIS — D6869 Other thrombophilia: Secondary | ICD-10-CM | POA: Diagnosis not present

## 2023-02-05 DIAGNOSIS — M810 Age-related osteoporosis without current pathological fracture: Secondary | ICD-10-CM | POA: Diagnosis not present

## 2023-02-05 DIAGNOSIS — L409 Psoriasis, unspecified: Secondary | ICD-10-CM | POA: Diagnosis not present

## 2023-02-05 DIAGNOSIS — Z556 Problems related to health literacy: Secondary | ICD-10-CM | POA: Diagnosis not present

## 2023-02-05 DIAGNOSIS — K458 Other specified abdominal hernia without obstruction or gangrene: Secondary | ICD-10-CM | POA: Diagnosis not present

## 2023-02-05 DIAGNOSIS — Z8616 Personal history of COVID-19: Secondary | ICD-10-CM | POA: Diagnosis not present

## 2023-02-05 DIAGNOSIS — Z951 Presence of aortocoronary bypass graft: Secondary | ICD-10-CM | POA: Diagnosis not present

## 2023-02-05 DIAGNOSIS — Z7901 Long term (current) use of anticoagulants: Secondary | ICD-10-CM | POA: Diagnosis not present

## 2023-02-05 DIAGNOSIS — D692 Other nonthrombocytopenic purpura: Secondary | ICD-10-CM | POA: Diagnosis not present

## 2023-02-05 DIAGNOSIS — M1711 Unilateral primary osteoarthritis, right knee: Secondary | ICD-10-CM | POA: Diagnosis not present

## 2023-02-05 DIAGNOSIS — D509 Iron deficiency anemia, unspecified: Secondary | ICD-10-CM | POA: Diagnosis not present

## 2023-02-05 DIAGNOSIS — K219 Gastro-esophageal reflux disease without esophagitis: Secondary | ICD-10-CM | POA: Diagnosis not present

## 2023-02-05 DIAGNOSIS — I1310 Hypertensive heart and chronic kidney disease without heart failure, with stage 1 through stage 4 chronic kidney disease, or unspecified chronic kidney disease: Secondary | ICD-10-CM | POA: Diagnosis not present

## 2023-02-05 DIAGNOSIS — H9193 Unspecified hearing loss, bilateral: Secondary | ICD-10-CM | POA: Diagnosis not present

## 2023-02-05 DIAGNOSIS — R2681 Unsteadiness on feet: Secondary | ICD-10-CM | POA: Diagnosis not present

## 2023-02-05 DIAGNOSIS — Z87891 Personal history of nicotine dependence: Secondary | ICD-10-CM | POA: Diagnosis not present

## 2023-02-05 DIAGNOSIS — N182 Chronic kidney disease, stage 2 (mild): Secondary | ICD-10-CM | POA: Diagnosis not present

## 2023-02-05 DIAGNOSIS — I4891 Unspecified atrial fibrillation: Secondary | ICD-10-CM | POA: Diagnosis not present

## 2023-02-05 DIAGNOSIS — E782 Mixed hyperlipidemia: Secondary | ICD-10-CM | POA: Diagnosis not present

## 2023-02-05 DIAGNOSIS — I2510 Atherosclerotic heart disease of native coronary artery without angina pectoris: Secondary | ICD-10-CM | POA: Diagnosis not present

## 2023-02-08 DIAGNOSIS — I1310 Hypertensive heart and chronic kidney disease without heart failure, with stage 1 through stage 4 chronic kidney disease, or unspecified chronic kidney disease: Secondary | ICD-10-CM | POA: Diagnosis not present

## 2023-02-08 DIAGNOSIS — D692 Other nonthrombocytopenic purpura: Secondary | ICD-10-CM | POA: Diagnosis not present

## 2023-02-08 DIAGNOSIS — I2510 Atherosclerotic heart disease of native coronary artery without angina pectoris: Secondary | ICD-10-CM | POA: Diagnosis not present

## 2023-02-08 DIAGNOSIS — N182 Chronic kidney disease, stage 2 (mild): Secondary | ICD-10-CM | POA: Diagnosis not present

## 2023-02-08 DIAGNOSIS — I4891 Unspecified atrial fibrillation: Secondary | ICD-10-CM | POA: Diagnosis not present

## 2023-02-08 DIAGNOSIS — M1711 Unilateral primary osteoarthritis, right knee: Secondary | ICD-10-CM | POA: Diagnosis not present

## 2023-02-11 DIAGNOSIS — I4891 Unspecified atrial fibrillation: Secondary | ICD-10-CM | POA: Diagnosis not present

## 2023-02-11 DIAGNOSIS — I1310 Hypertensive heart and chronic kidney disease without heart failure, with stage 1 through stage 4 chronic kidney disease, or unspecified chronic kidney disease: Secondary | ICD-10-CM | POA: Diagnosis not present

## 2023-02-11 DIAGNOSIS — D692 Other nonthrombocytopenic purpura: Secondary | ICD-10-CM | POA: Diagnosis not present

## 2023-02-11 DIAGNOSIS — N182 Chronic kidney disease, stage 2 (mild): Secondary | ICD-10-CM | POA: Diagnosis not present

## 2023-02-11 DIAGNOSIS — I2510 Atherosclerotic heart disease of native coronary artery without angina pectoris: Secondary | ICD-10-CM | POA: Diagnosis not present

## 2023-02-11 DIAGNOSIS — M1711 Unilateral primary osteoarthritis, right knee: Secondary | ICD-10-CM | POA: Diagnosis not present

## 2023-02-15 DIAGNOSIS — M1711 Unilateral primary osteoarthritis, right knee: Secondary | ICD-10-CM | POA: Diagnosis not present

## 2023-02-15 DIAGNOSIS — I1310 Hypertensive heart and chronic kidney disease without heart failure, with stage 1 through stage 4 chronic kidney disease, or unspecified chronic kidney disease: Secondary | ICD-10-CM | POA: Diagnosis not present

## 2023-02-15 DIAGNOSIS — I2510 Atherosclerotic heart disease of native coronary artery without angina pectoris: Secondary | ICD-10-CM | POA: Diagnosis not present

## 2023-02-15 DIAGNOSIS — D692 Other nonthrombocytopenic purpura: Secondary | ICD-10-CM | POA: Diagnosis not present

## 2023-02-15 DIAGNOSIS — I4891 Unspecified atrial fibrillation: Secondary | ICD-10-CM | POA: Diagnosis not present

## 2023-02-15 DIAGNOSIS — N182 Chronic kidney disease, stage 2 (mild): Secondary | ICD-10-CM | POA: Diagnosis not present

## 2023-02-18 DIAGNOSIS — D692 Other nonthrombocytopenic purpura: Secondary | ICD-10-CM | POA: Diagnosis not present

## 2023-02-18 DIAGNOSIS — I4891 Unspecified atrial fibrillation: Secondary | ICD-10-CM | POA: Diagnosis not present

## 2023-02-18 DIAGNOSIS — I2510 Atherosclerotic heart disease of native coronary artery without angina pectoris: Secondary | ICD-10-CM | POA: Diagnosis not present

## 2023-02-18 DIAGNOSIS — I1310 Hypertensive heart and chronic kidney disease without heart failure, with stage 1 through stage 4 chronic kidney disease, or unspecified chronic kidney disease: Secondary | ICD-10-CM | POA: Diagnosis not present

## 2023-02-18 DIAGNOSIS — M1711 Unilateral primary osteoarthritis, right knee: Secondary | ICD-10-CM | POA: Diagnosis not present

## 2023-02-18 DIAGNOSIS — N182 Chronic kidney disease, stage 2 (mild): Secondary | ICD-10-CM | POA: Diagnosis not present

## 2023-02-22 DIAGNOSIS — M1711 Unilateral primary osteoarthritis, right knee: Secondary | ICD-10-CM | POA: Diagnosis not present

## 2023-02-22 DIAGNOSIS — D692 Other nonthrombocytopenic purpura: Secondary | ICD-10-CM | POA: Diagnosis not present

## 2023-02-22 DIAGNOSIS — N182 Chronic kidney disease, stage 2 (mild): Secondary | ICD-10-CM | POA: Diagnosis not present

## 2023-02-22 DIAGNOSIS — I4891 Unspecified atrial fibrillation: Secondary | ICD-10-CM | POA: Diagnosis not present

## 2023-02-22 DIAGNOSIS — I1310 Hypertensive heart and chronic kidney disease without heart failure, with stage 1 through stage 4 chronic kidney disease, or unspecified chronic kidney disease: Secondary | ICD-10-CM | POA: Diagnosis not present

## 2023-02-22 DIAGNOSIS — I2510 Atherosclerotic heart disease of native coronary artery without angina pectoris: Secondary | ICD-10-CM | POA: Diagnosis not present

## 2023-02-25 DIAGNOSIS — I4891 Unspecified atrial fibrillation: Secondary | ICD-10-CM | POA: Diagnosis not present

## 2023-02-25 DIAGNOSIS — I1310 Hypertensive heart and chronic kidney disease without heart failure, with stage 1 through stage 4 chronic kidney disease, or unspecified chronic kidney disease: Secondary | ICD-10-CM | POA: Diagnosis not present

## 2023-02-25 DIAGNOSIS — D692 Other nonthrombocytopenic purpura: Secondary | ICD-10-CM | POA: Diagnosis not present

## 2023-02-25 DIAGNOSIS — I2510 Atherosclerotic heart disease of native coronary artery without angina pectoris: Secondary | ICD-10-CM | POA: Diagnosis not present

## 2023-02-25 DIAGNOSIS — M1711 Unilateral primary osteoarthritis, right knee: Secondary | ICD-10-CM | POA: Diagnosis not present

## 2023-02-25 DIAGNOSIS — N182 Chronic kidney disease, stage 2 (mild): Secondary | ICD-10-CM | POA: Diagnosis not present

## 2023-03-01 DIAGNOSIS — M1711 Unilateral primary osteoarthritis, right knee: Secondary | ICD-10-CM | POA: Diagnosis not present

## 2023-03-01 DIAGNOSIS — I2510 Atherosclerotic heart disease of native coronary artery without angina pectoris: Secondary | ICD-10-CM | POA: Diagnosis not present

## 2023-03-01 DIAGNOSIS — D692 Other nonthrombocytopenic purpura: Secondary | ICD-10-CM | POA: Diagnosis not present

## 2023-03-01 DIAGNOSIS — I1310 Hypertensive heart and chronic kidney disease without heart failure, with stage 1 through stage 4 chronic kidney disease, or unspecified chronic kidney disease: Secondary | ICD-10-CM | POA: Diagnosis not present

## 2023-03-01 DIAGNOSIS — N182 Chronic kidney disease, stage 2 (mild): Secondary | ICD-10-CM | POA: Diagnosis not present

## 2023-03-01 DIAGNOSIS — I4891 Unspecified atrial fibrillation: Secondary | ICD-10-CM | POA: Diagnosis not present

## 2023-03-07 DIAGNOSIS — D6869 Other thrombophilia: Secondary | ICD-10-CM | POA: Diagnosis not present

## 2023-03-07 DIAGNOSIS — Z7901 Long term (current) use of anticoagulants: Secondary | ICD-10-CM | POA: Diagnosis not present

## 2023-03-07 DIAGNOSIS — Z951 Presence of aortocoronary bypass graft: Secondary | ICD-10-CM | POA: Diagnosis not present

## 2023-03-07 DIAGNOSIS — D692 Other nonthrombocytopenic purpura: Secondary | ICD-10-CM | POA: Diagnosis not present

## 2023-03-07 DIAGNOSIS — Z87891 Personal history of nicotine dependence: Secondary | ICD-10-CM | POA: Diagnosis not present

## 2023-03-07 DIAGNOSIS — E782 Mixed hyperlipidemia: Secondary | ICD-10-CM | POA: Diagnosis not present

## 2023-03-07 DIAGNOSIS — I1310 Hypertensive heart and chronic kidney disease without heart failure, with stage 1 through stage 4 chronic kidney disease, or unspecified chronic kidney disease: Secondary | ICD-10-CM | POA: Diagnosis not present

## 2023-03-07 DIAGNOSIS — K219 Gastro-esophageal reflux disease without esophagitis: Secondary | ICD-10-CM | POA: Diagnosis not present

## 2023-03-07 DIAGNOSIS — M1711 Unilateral primary osteoarthritis, right knee: Secondary | ICD-10-CM | POA: Diagnosis not present

## 2023-03-07 DIAGNOSIS — D509 Iron deficiency anemia, unspecified: Secondary | ICD-10-CM | POA: Diagnosis not present

## 2023-03-07 DIAGNOSIS — I4891 Unspecified atrial fibrillation: Secondary | ICD-10-CM | POA: Diagnosis not present

## 2023-03-07 DIAGNOSIS — L409 Psoriasis, unspecified: Secondary | ICD-10-CM | POA: Diagnosis not present

## 2023-03-07 DIAGNOSIS — Z8616 Personal history of COVID-19: Secondary | ICD-10-CM | POA: Diagnosis not present

## 2023-03-07 DIAGNOSIS — I2510 Atherosclerotic heart disease of native coronary artery without angina pectoris: Secondary | ICD-10-CM | POA: Diagnosis not present

## 2023-03-07 DIAGNOSIS — R2681 Unsteadiness on feet: Secondary | ICD-10-CM | POA: Diagnosis not present

## 2023-03-07 DIAGNOSIS — Z556 Problems related to health literacy: Secondary | ICD-10-CM | POA: Diagnosis not present

## 2023-03-07 DIAGNOSIS — N182 Chronic kidney disease, stage 2 (mild): Secondary | ICD-10-CM | POA: Diagnosis not present

## 2023-03-07 DIAGNOSIS — M810 Age-related osteoporosis without current pathological fracture: Secondary | ICD-10-CM | POA: Diagnosis not present

## 2023-03-07 DIAGNOSIS — H9193 Unspecified hearing loss, bilateral: Secondary | ICD-10-CM | POA: Diagnosis not present

## 2023-03-07 DIAGNOSIS — K458 Other specified abdominal hernia without obstruction or gangrene: Secondary | ICD-10-CM | POA: Diagnosis not present

## 2023-03-08 ENCOUNTER — Other Ambulatory Visit: Payer: Self-pay | Admitting: Family Medicine

## 2023-03-08 ENCOUNTER — Ambulatory Visit
Admission: RE | Admit: 2023-03-08 | Discharge: 2023-03-08 | Disposition: A | Discharge status: Home / Self Care | Payer: Medicare Other | Source: Ambulatory Visit | Attending: Family Medicine | Admitting: Family Medicine

## 2023-03-08 DIAGNOSIS — N182 Chronic kidney disease, stage 2 (mild): Secondary | ICD-10-CM | POA: Diagnosis not present

## 2023-03-08 DIAGNOSIS — R042 Hemoptysis: Secondary | ICD-10-CM

## 2023-03-08 DIAGNOSIS — R231 Pallor: Secondary | ICD-10-CM | POA: Diagnosis not present

## 2023-03-08 DIAGNOSIS — I119 Hypertensive heart disease without heart failure: Secondary | ICD-10-CM | POA: Diagnosis not present

## 2023-03-08 DIAGNOSIS — E782 Mixed hyperlipidemia: Secondary | ICD-10-CM | POA: Diagnosis not present

## 2023-03-08 DIAGNOSIS — M8000XA Age-related osteoporosis with current pathological fracture, unspecified site, initial encounter for fracture: Secondary | ICD-10-CM | POA: Diagnosis not present

## 2023-03-09 DIAGNOSIS — N182 Chronic kidney disease, stage 2 (mild): Secondary | ICD-10-CM | POA: Diagnosis not present

## 2023-03-09 DIAGNOSIS — I2510 Atherosclerotic heart disease of native coronary artery without angina pectoris: Secondary | ICD-10-CM | POA: Diagnosis not present

## 2023-03-09 DIAGNOSIS — M1711 Unilateral primary osteoarthritis, right knee: Secondary | ICD-10-CM | POA: Diagnosis not present

## 2023-03-09 DIAGNOSIS — D692 Other nonthrombocytopenic purpura: Secondary | ICD-10-CM | POA: Diagnosis not present

## 2023-03-09 DIAGNOSIS — I4891 Unspecified atrial fibrillation: Secondary | ICD-10-CM | POA: Diagnosis not present

## 2023-03-09 DIAGNOSIS — I1310 Hypertensive heart and chronic kidney disease without heart failure, with stage 1 through stage 4 chronic kidney disease, or unspecified chronic kidney disease: Secondary | ICD-10-CM | POA: Diagnosis not present

## 2023-03-10 ENCOUNTER — Other Ambulatory Visit (HOSPITAL_COMMUNITY): Payer: Self-pay | Admitting: *Deleted

## 2023-03-10 DIAGNOSIS — D649 Anemia, unspecified: Secondary | ICD-10-CM

## 2023-03-15 ENCOUNTER — Encounter (HOSPITAL_COMMUNITY)
Admission: RE | Admit: 2023-03-15 | Discharge: 2023-03-15 | Disposition: A | Discharge status: Home / Self Care | Payer: Medicare Other | Source: Ambulatory Visit | Attending: Family Medicine | Admitting: Family Medicine

## 2023-03-15 DIAGNOSIS — D649 Anemia, unspecified: Secondary | ICD-10-CM

## 2023-03-15 LAB — HEMOGLOBIN AND HEMATOCRIT, BLOOD
HCT: 29.7 % — ABNORMAL LOW (ref 39.0–52.0)
Hemoglobin: 8.7 g/dL — ABNORMAL LOW (ref 13.0–17.0)

## 2023-03-15 LAB — PREPARE RBC (CROSSMATCH)

## 2023-03-15 MED ORDER — SODIUM CHLORIDE 0.9% IV SOLUTION: Freq: Once | INTRAVENOUS | Status: DC

## 2023-03-15 MED ORDER — FUROSEMIDE 10 MG/ML IJ SOLN
INTRAMUSCULAR | Status: AC
  Administered 2023-03-15: 20 mg via INTRAVENOUS
  Filled 2023-03-15: qty 2

## 2023-03-15 MED ORDER — FUROSEMIDE 10 MG/ML IJ SOLN: 20.0000 mg | Freq: Once | INTRAMUSCULAR | Status: AC

## 2023-03-16 LAB — BPAM RBC
Blood Product Expiration Date: 202405242359
Blood Product Expiration Date: 202405242359
ISSUE DATE / TIME: 202405060929
ISSUE DATE / TIME: 202405061140
Unit Type and Rh: 6200
Unit Type and Rh: 6200

## 2023-03-16 LAB — TYPE AND SCREEN
ABO/RH(D): A POS
Antibody Screen: NEGATIVE
Unit division: 0
Unit division: 0

## 2023-03-25 DIAGNOSIS — D509 Iron deficiency anemia, unspecified: Secondary | ICD-10-CM | POA: Diagnosis not present

## 2023-04-01 DIAGNOSIS — I1310 Hypertensive heart and chronic kidney disease without heart failure, with stage 1 through stage 4 chronic kidney disease, or unspecified chronic kidney disease: Secondary | ICD-10-CM | POA: Diagnosis not present

## 2023-04-01 DIAGNOSIS — N182 Chronic kidney disease, stage 2 (mild): Secondary | ICD-10-CM | POA: Diagnosis not present

## 2023-04-01 DIAGNOSIS — I4891 Unspecified atrial fibrillation: Secondary | ICD-10-CM | POA: Diagnosis not present

## 2023-04-01 DIAGNOSIS — D692 Other nonthrombocytopenic purpura: Secondary | ICD-10-CM | POA: Diagnosis not present

## 2023-04-01 DIAGNOSIS — I2510 Atherosclerotic heart disease of native coronary artery without angina pectoris: Secondary | ICD-10-CM | POA: Diagnosis not present

## 2023-04-01 DIAGNOSIS — M1711 Unilateral primary osteoarthritis, right knee: Secondary | ICD-10-CM | POA: Diagnosis not present

## 2023-04-06 DIAGNOSIS — D692 Other nonthrombocytopenic purpura: Secondary | ICD-10-CM | POA: Diagnosis not present

## 2023-04-06 DIAGNOSIS — I2510 Atherosclerotic heart disease of native coronary artery without angina pectoris: Secondary | ICD-10-CM | POA: Diagnosis not present

## 2023-04-06 DIAGNOSIS — K219 Gastro-esophageal reflux disease without esophagitis: Secondary | ICD-10-CM | POA: Diagnosis not present

## 2023-04-06 DIAGNOSIS — I4891 Unspecified atrial fibrillation: Secondary | ICD-10-CM | POA: Diagnosis not present

## 2023-04-06 DIAGNOSIS — K458 Other specified abdominal hernia without obstruction or gangrene: Secondary | ICD-10-CM | POA: Diagnosis not present

## 2023-04-06 DIAGNOSIS — N182 Chronic kidney disease, stage 2 (mild): Secondary | ICD-10-CM | POA: Diagnosis not present

## 2023-04-06 DIAGNOSIS — Z951 Presence of aortocoronary bypass graft: Secondary | ICD-10-CM | POA: Diagnosis not present

## 2023-04-06 DIAGNOSIS — Z556 Problems related to health literacy: Secondary | ICD-10-CM | POA: Diagnosis not present

## 2023-04-06 DIAGNOSIS — M170 Bilateral primary osteoarthritis of knee: Secondary | ICD-10-CM | POA: Diagnosis not present

## 2023-04-06 DIAGNOSIS — Z87891 Personal history of nicotine dependence: Secondary | ICD-10-CM | POA: Diagnosis not present

## 2023-04-06 DIAGNOSIS — E782 Mixed hyperlipidemia: Secondary | ICD-10-CM | POA: Diagnosis not present

## 2023-04-06 DIAGNOSIS — D6869 Other thrombophilia: Secondary | ICD-10-CM | POA: Diagnosis not present

## 2023-04-06 DIAGNOSIS — M810 Age-related osteoporosis without current pathological fracture: Secondary | ICD-10-CM | POA: Diagnosis not present

## 2023-04-06 DIAGNOSIS — D509 Iron deficiency anemia, unspecified: Secondary | ICD-10-CM | POA: Diagnosis not present

## 2023-04-06 DIAGNOSIS — H9193 Unspecified hearing loss, bilateral: Secondary | ICD-10-CM | POA: Diagnosis not present

## 2023-04-06 DIAGNOSIS — I1310 Hypertensive heart and chronic kidney disease without heart failure, with stage 1 through stage 4 chronic kidney disease, or unspecified chronic kidney disease: Secondary | ICD-10-CM | POA: Diagnosis not present

## 2023-04-06 DIAGNOSIS — L409 Psoriasis, unspecified: Secondary | ICD-10-CM | POA: Diagnosis not present

## 2023-04-06 DIAGNOSIS — Z8616 Personal history of COVID-19: Secondary | ICD-10-CM | POA: Diagnosis not present

## 2023-04-07 DIAGNOSIS — I4891 Unspecified atrial fibrillation: Secondary | ICD-10-CM | POA: Diagnosis not present

## 2023-04-07 DIAGNOSIS — I1310 Hypertensive heart and chronic kidney disease without heart failure, with stage 1 through stage 4 chronic kidney disease, or unspecified chronic kidney disease: Secondary | ICD-10-CM | POA: Diagnosis not present

## 2023-04-07 DIAGNOSIS — I2510 Atherosclerotic heart disease of native coronary artery without angina pectoris: Secondary | ICD-10-CM | POA: Diagnosis not present

## 2023-04-07 DIAGNOSIS — M170 Bilateral primary osteoarthritis of knee: Secondary | ICD-10-CM | POA: Diagnosis not present

## 2023-04-07 DIAGNOSIS — D509 Iron deficiency anemia, unspecified: Secondary | ICD-10-CM | POA: Diagnosis not present

## 2023-04-07 DIAGNOSIS — M810 Age-related osteoporosis without current pathological fracture: Secondary | ICD-10-CM | POA: Diagnosis not present

## 2023-04-14 DIAGNOSIS — M810 Age-related osteoporosis without current pathological fracture: Secondary | ICD-10-CM | POA: Diagnosis not present

## 2023-04-14 DIAGNOSIS — M170 Bilateral primary osteoarthritis of knee: Secondary | ICD-10-CM | POA: Diagnosis not present

## 2023-04-14 DIAGNOSIS — I1310 Hypertensive heart and chronic kidney disease without heart failure, with stage 1 through stage 4 chronic kidney disease, or unspecified chronic kidney disease: Secondary | ICD-10-CM | POA: Diagnosis not present

## 2023-04-14 DIAGNOSIS — I2510 Atherosclerotic heart disease of native coronary artery without angina pectoris: Secondary | ICD-10-CM | POA: Diagnosis not present

## 2023-04-14 DIAGNOSIS — D509 Iron deficiency anemia, unspecified: Secondary | ICD-10-CM | POA: Diagnosis not present

## 2023-04-14 DIAGNOSIS — I4891 Unspecified atrial fibrillation: Secondary | ICD-10-CM | POA: Diagnosis not present

## 2023-04-21 DIAGNOSIS — M810 Age-related osteoporosis without current pathological fracture: Secondary | ICD-10-CM | POA: Diagnosis not present

## 2023-04-21 DIAGNOSIS — D509 Iron deficiency anemia, unspecified: Secondary | ICD-10-CM | POA: Diagnosis not present

## 2023-04-21 DIAGNOSIS — I1310 Hypertensive heart and chronic kidney disease without heart failure, with stage 1 through stage 4 chronic kidney disease, or unspecified chronic kidney disease: Secondary | ICD-10-CM | POA: Diagnosis not present

## 2023-04-21 DIAGNOSIS — I2510 Atherosclerotic heart disease of native coronary artery without angina pectoris: Secondary | ICD-10-CM | POA: Diagnosis not present

## 2023-04-21 DIAGNOSIS — M170 Bilateral primary osteoarthritis of knee: Secondary | ICD-10-CM | POA: Diagnosis not present

## 2023-04-21 DIAGNOSIS — I4891 Unspecified atrial fibrillation: Secondary | ICD-10-CM | POA: Diagnosis not present

## 2023-04-28 DIAGNOSIS — I4891 Unspecified atrial fibrillation: Secondary | ICD-10-CM | POA: Diagnosis not present

## 2023-04-28 DIAGNOSIS — I2510 Atherosclerotic heart disease of native coronary artery without angina pectoris: Secondary | ICD-10-CM | POA: Diagnosis not present

## 2023-04-28 DIAGNOSIS — M810 Age-related osteoporosis without current pathological fracture: Secondary | ICD-10-CM | POA: Diagnosis not present

## 2023-04-28 DIAGNOSIS — I1310 Hypertensive heart and chronic kidney disease without heart failure, with stage 1 through stage 4 chronic kidney disease, or unspecified chronic kidney disease: Secondary | ICD-10-CM | POA: Diagnosis not present

## 2023-04-28 DIAGNOSIS — M170 Bilateral primary osteoarthritis of knee: Secondary | ICD-10-CM | POA: Diagnosis not present

## 2023-04-28 DIAGNOSIS — D509 Iron deficiency anemia, unspecified: Secondary | ICD-10-CM | POA: Diagnosis not present

## 2023-05-06 DIAGNOSIS — K219 Gastro-esophageal reflux disease without esophagitis: Secondary | ICD-10-CM | POA: Diagnosis not present

## 2023-05-06 DIAGNOSIS — I2510 Atherosclerotic heart disease of native coronary artery without angina pectoris: Secondary | ICD-10-CM | POA: Diagnosis not present

## 2023-05-06 DIAGNOSIS — N182 Chronic kidney disease, stage 2 (mild): Secondary | ICD-10-CM | POA: Diagnosis not present

## 2023-05-06 DIAGNOSIS — I1310 Hypertensive heart and chronic kidney disease without heart failure, with stage 1 through stage 4 chronic kidney disease, or unspecified chronic kidney disease: Secondary | ICD-10-CM | POA: Diagnosis not present

## 2023-05-06 DIAGNOSIS — D6869 Other thrombophilia: Secondary | ICD-10-CM | POA: Diagnosis not present

## 2023-05-06 DIAGNOSIS — E782 Mixed hyperlipidemia: Secondary | ICD-10-CM | POA: Diagnosis not present

## 2023-05-06 DIAGNOSIS — Z8616 Personal history of COVID-19: Secondary | ICD-10-CM | POA: Diagnosis not present

## 2023-05-06 DIAGNOSIS — D692 Other nonthrombocytopenic purpura: Secondary | ICD-10-CM | POA: Diagnosis not present

## 2023-05-06 DIAGNOSIS — K458 Other specified abdominal hernia without obstruction or gangrene: Secondary | ICD-10-CM | POA: Diagnosis not present

## 2023-05-06 DIAGNOSIS — M170 Bilateral primary osteoarthritis of knee: Secondary | ICD-10-CM | POA: Diagnosis not present

## 2023-05-06 DIAGNOSIS — Z87891 Personal history of nicotine dependence: Secondary | ICD-10-CM | POA: Diagnosis not present

## 2023-05-06 DIAGNOSIS — L409 Psoriasis, unspecified: Secondary | ICD-10-CM | POA: Diagnosis not present

## 2023-05-06 DIAGNOSIS — Z951 Presence of aortocoronary bypass graft: Secondary | ICD-10-CM | POA: Diagnosis not present

## 2023-05-06 DIAGNOSIS — I4891 Unspecified atrial fibrillation: Secondary | ICD-10-CM | POA: Diagnosis not present

## 2023-05-06 DIAGNOSIS — M810 Age-related osteoporosis without current pathological fracture: Secondary | ICD-10-CM | POA: Diagnosis not present

## 2023-05-06 DIAGNOSIS — Z556 Problems related to health literacy: Secondary | ICD-10-CM | POA: Diagnosis not present

## 2023-05-06 DIAGNOSIS — D509 Iron deficiency anemia, unspecified: Secondary | ICD-10-CM | POA: Diagnosis not present

## 2023-05-06 DIAGNOSIS — H9193 Unspecified hearing loss, bilateral: Secondary | ICD-10-CM | POA: Diagnosis not present

## 2023-05-12 DIAGNOSIS — M810 Age-related osteoporosis without current pathological fracture: Secondary | ICD-10-CM | POA: Diagnosis not present

## 2023-05-12 DIAGNOSIS — I1310 Hypertensive heart and chronic kidney disease without heart failure, with stage 1 through stage 4 chronic kidney disease, or unspecified chronic kidney disease: Secondary | ICD-10-CM | POA: Diagnosis not present

## 2023-05-12 DIAGNOSIS — I2510 Atherosclerotic heart disease of native coronary artery without angina pectoris: Secondary | ICD-10-CM | POA: Diagnosis not present

## 2023-05-12 DIAGNOSIS — D509 Iron deficiency anemia, unspecified: Secondary | ICD-10-CM | POA: Diagnosis not present

## 2023-05-12 DIAGNOSIS — M170 Bilateral primary osteoarthritis of knee: Secondary | ICD-10-CM | POA: Diagnosis not present

## 2023-05-12 DIAGNOSIS — I4891 Unspecified atrial fibrillation: Secondary | ICD-10-CM | POA: Diagnosis not present

## 2023-05-18 DIAGNOSIS — D509 Iron deficiency anemia, unspecified: Secondary | ICD-10-CM | POA: Diagnosis not present

## 2023-06-11 DIAGNOSIS — I4891 Unspecified atrial fibrillation: Secondary | ICD-10-CM | POA: Diagnosis not present

## 2023-06-11 DIAGNOSIS — D509 Iron deficiency anemia, unspecified: Secondary | ICD-10-CM | POA: Diagnosis not present

## 2023-06-11 DIAGNOSIS — R490 Dysphonia: Secondary | ICD-10-CM | POA: Diagnosis not present

## 2023-06-15 DIAGNOSIS — J387 Other diseases of larynx: Secondary | ICD-10-CM | POA: Diagnosis not present

## 2023-06-15 DIAGNOSIS — R490 Dysphonia: Secondary | ICD-10-CM | POA: Diagnosis not present

## 2023-06-28 ENCOUNTER — Institutional Professional Consult (permissible substitution) (INDEPENDENT_AMBULATORY_CARE_PROVIDER_SITE_OTHER): Payer: Medicare Other | Admitting: Otolaryngology

## 2023-07-07 DIAGNOSIS — J387 Other diseases of larynx: Secondary | ICD-10-CM | POA: Diagnosis not present

## 2023-07-07 DIAGNOSIS — R490 Dysphonia: Secondary | ICD-10-CM | POA: Diagnosis not present

## 2023-07-07 DIAGNOSIS — C329 Malignant neoplasm of larynx, unspecified: Secondary | ICD-10-CM | POA: Diagnosis not present

## 2023-07-28 DIAGNOSIS — Z87891 Personal history of nicotine dependence: Secondary | ICD-10-CM | POA: Diagnosis not present

## 2023-07-28 DIAGNOSIS — C323 Malignant neoplasm of laryngeal cartilage: Secondary | ICD-10-CM | POA: Diagnosis not present

## 2023-07-28 DIAGNOSIS — C329 Malignant neoplasm of larynx, unspecified: Secondary | ICD-10-CM | POA: Diagnosis not present

## 2023-07-28 DIAGNOSIS — I2510 Atherosclerotic heart disease of native coronary artery without angina pectoris: Secondary | ICD-10-CM | POA: Diagnosis not present

## 2023-07-28 DIAGNOSIS — I4891 Unspecified atrial fibrillation: Secondary | ICD-10-CM | POA: Diagnosis not present

## 2023-07-28 DIAGNOSIS — Z79899 Other long term (current) drug therapy: Secondary | ICD-10-CM | POA: Diagnosis not present

## 2023-08-05 DIAGNOSIS — C321 Malignant neoplasm of supraglottis: Secondary | ICD-10-CM | POA: Diagnosis not present

## 2023-08-05 DIAGNOSIS — Z87891 Personal history of nicotine dependence: Secondary | ICD-10-CM | POA: Diagnosis not present

## 2023-08-13 DIAGNOSIS — R1312 Dysphagia, oropharyngeal phase: Secondary | ICD-10-CM | POA: Diagnosis not present

## 2023-08-13 DIAGNOSIS — C329 Malignant neoplasm of larynx, unspecified: Secondary | ICD-10-CM | POA: Diagnosis not present

## 2023-08-13 DIAGNOSIS — C321 Malignant neoplasm of supraglottis: Secondary | ICD-10-CM | POA: Diagnosis not present

## 2023-08-13 DIAGNOSIS — Z510 Encounter for antineoplastic radiation therapy: Secondary | ICD-10-CM | POA: Diagnosis not present

## 2023-08-20 DIAGNOSIS — Z510 Encounter for antineoplastic radiation therapy: Secondary | ICD-10-CM | POA: Diagnosis not present

## 2023-08-20 DIAGNOSIS — C329 Malignant neoplasm of larynx, unspecified: Secondary | ICD-10-CM | POA: Diagnosis not present

## 2023-08-20 DIAGNOSIS — C321 Malignant neoplasm of supraglottis: Secondary | ICD-10-CM | POA: Diagnosis not present

## 2023-08-25 DIAGNOSIS — C329 Malignant neoplasm of larynx, unspecified: Secondary | ICD-10-CM | POA: Diagnosis not present

## 2023-08-25 DIAGNOSIS — Z510 Encounter for antineoplastic radiation therapy: Secondary | ICD-10-CM | POA: Diagnosis not present

## 2023-08-25 DIAGNOSIS — C321 Malignant neoplasm of supraglottis: Secondary | ICD-10-CM | POA: Diagnosis not present

## 2023-08-26 DIAGNOSIS — Z510 Encounter for antineoplastic radiation therapy: Secondary | ICD-10-CM | POA: Diagnosis not present

## 2023-08-26 DIAGNOSIS — C329 Malignant neoplasm of larynx, unspecified: Secondary | ICD-10-CM | POA: Diagnosis not present

## 2023-08-26 DIAGNOSIS — C321 Malignant neoplasm of supraglottis: Secondary | ICD-10-CM | POA: Diagnosis not present

## 2023-08-27 DIAGNOSIS — Z510 Encounter for antineoplastic radiation therapy: Secondary | ICD-10-CM | POA: Diagnosis not present

## 2023-08-27 DIAGNOSIS — C321 Malignant neoplasm of supraglottis: Secondary | ICD-10-CM | POA: Diagnosis not present

## 2023-08-27 DIAGNOSIS — C329 Malignant neoplasm of larynx, unspecified: Secondary | ICD-10-CM | POA: Diagnosis not present

## 2023-08-30 DIAGNOSIS — Z510 Encounter for antineoplastic radiation therapy: Secondary | ICD-10-CM | POA: Diagnosis not present

## 2023-08-30 DIAGNOSIS — C321 Malignant neoplasm of supraglottis: Secondary | ICD-10-CM | POA: Diagnosis not present

## 2023-08-30 DIAGNOSIS — R1312 Dysphagia, oropharyngeal phase: Secondary | ICD-10-CM | POA: Diagnosis not present

## 2023-08-30 DIAGNOSIS — C329 Malignant neoplasm of larynx, unspecified: Secondary | ICD-10-CM | POA: Diagnosis not present

## 2023-08-31 DIAGNOSIS — Z510 Encounter for antineoplastic radiation therapy: Secondary | ICD-10-CM | POA: Diagnosis not present

## 2023-08-31 DIAGNOSIS — C329 Malignant neoplasm of larynx, unspecified: Secondary | ICD-10-CM | POA: Diagnosis not present

## 2023-08-31 DIAGNOSIS — C321 Malignant neoplasm of supraglottis: Secondary | ICD-10-CM | POA: Diagnosis not present

## 2023-09-01 DIAGNOSIS — Z510 Encounter for antineoplastic radiation therapy: Secondary | ICD-10-CM | POA: Diagnosis not present

## 2023-09-01 DIAGNOSIS — C321 Malignant neoplasm of supraglottis: Secondary | ICD-10-CM | POA: Diagnosis not present

## 2023-09-01 DIAGNOSIS — C329 Malignant neoplasm of larynx, unspecified: Secondary | ICD-10-CM | POA: Diagnosis not present

## 2023-09-02 DIAGNOSIS — Z510 Encounter for antineoplastic radiation therapy: Secondary | ICD-10-CM | POA: Diagnosis not present

## 2023-09-02 DIAGNOSIS — C329 Malignant neoplasm of larynx, unspecified: Secondary | ICD-10-CM | POA: Diagnosis not present

## 2023-09-02 DIAGNOSIS — C321 Malignant neoplasm of supraglottis: Secondary | ICD-10-CM | POA: Diagnosis not present

## 2023-09-03 DIAGNOSIS — C321 Malignant neoplasm of supraglottis: Secondary | ICD-10-CM | POA: Diagnosis not present

## 2023-09-03 DIAGNOSIS — Z510 Encounter for antineoplastic radiation therapy: Secondary | ICD-10-CM | POA: Diagnosis not present

## 2023-09-03 DIAGNOSIS — C329 Malignant neoplasm of larynx, unspecified: Secondary | ICD-10-CM | POA: Diagnosis not present

## 2023-09-06 DIAGNOSIS — R1312 Dysphagia, oropharyngeal phase: Secondary | ICD-10-CM | POA: Diagnosis not present

## 2023-09-06 DIAGNOSIS — C329 Malignant neoplasm of larynx, unspecified: Secondary | ICD-10-CM | POA: Diagnosis not present

## 2023-09-06 DIAGNOSIS — Z510 Encounter for antineoplastic radiation therapy: Secondary | ICD-10-CM | POA: Diagnosis not present

## 2023-09-06 DIAGNOSIS — C321 Malignant neoplasm of supraglottis: Secondary | ICD-10-CM | POA: Diagnosis not present

## 2023-09-07 DIAGNOSIS — C321 Malignant neoplasm of supraglottis: Secondary | ICD-10-CM | POA: Diagnosis not present

## 2023-09-07 DIAGNOSIS — Z510 Encounter for antineoplastic radiation therapy: Secondary | ICD-10-CM | POA: Diagnosis not present

## 2023-09-07 DIAGNOSIS — C329 Malignant neoplasm of larynx, unspecified: Secondary | ICD-10-CM | POA: Diagnosis not present

## 2023-09-08 DIAGNOSIS — C321 Malignant neoplasm of supraglottis: Secondary | ICD-10-CM | POA: Diagnosis not present

## 2023-09-08 DIAGNOSIS — Z510 Encounter for antineoplastic radiation therapy: Secondary | ICD-10-CM | POA: Diagnosis not present

## 2023-09-08 DIAGNOSIS — C329 Malignant neoplasm of larynx, unspecified: Secondary | ICD-10-CM | POA: Diagnosis not present

## 2023-09-09 DIAGNOSIS — C321 Malignant neoplasm of supraglottis: Secondary | ICD-10-CM | POA: Diagnosis not present

## 2023-09-09 DIAGNOSIS — C329 Malignant neoplasm of larynx, unspecified: Secondary | ICD-10-CM | POA: Diagnosis not present

## 2023-09-09 DIAGNOSIS — Z510 Encounter for antineoplastic radiation therapy: Secondary | ICD-10-CM | POA: Diagnosis not present

## 2023-09-10 DIAGNOSIS — G893 Neoplasm related pain (acute) (chronic): Secondary | ICD-10-CM | POA: Diagnosis not present

## 2023-09-10 DIAGNOSIS — Z510 Encounter for antineoplastic radiation therapy: Secondary | ICD-10-CM | POA: Diagnosis not present

## 2023-09-10 DIAGNOSIS — C321 Malignant neoplasm of supraglottis: Secondary | ICD-10-CM | POA: Diagnosis not present

## 2023-09-13 DIAGNOSIS — C329 Malignant neoplasm of larynx, unspecified: Secondary | ICD-10-CM | POA: Diagnosis not present

## 2023-09-13 DIAGNOSIS — R1312 Dysphagia, oropharyngeal phase: Secondary | ICD-10-CM | POA: Diagnosis not present

## 2023-09-13 DIAGNOSIS — Z510 Encounter for antineoplastic radiation therapy: Secondary | ICD-10-CM | POA: Diagnosis not present

## 2023-09-13 DIAGNOSIS — G893 Neoplasm related pain (acute) (chronic): Secondary | ICD-10-CM | POA: Diagnosis not present

## 2023-09-13 DIAGNOSIS — C321 Malignant neoplasm of supraglottis: Secondary | ICD-10-CM | POA: Diagnosis not present

## 2023-09-14 DIAGNOSIS — C321 Malignant neoplasm of supraglottis: Secondary | ICD-10-CM | POA: Diagnosis not present

## 2023-09-14 DIAGNOSIS — D84822 Immunodeficiency due to external causes: Secondary | ICD-10-CM | POA: Diagnosis not present

## 2023-09-14 DIAGNOSIS — G893 Neoplasm related pain (acute) (chronic): Secondary | ICD-10-CM | POA: Diagnosis not present

## 2023-09-14 DIAGNOSIS — Z0000 Encounter for general adult medical examination without abnormal findings: Secondary | ICD-10-CM | POA: Diagnosis not present

## 2023-09-14 DIAGNOSIS — E782 Mixed hyperlipidemia: Secondary | ICD-10-CM | POA: Diagnosis not present

## 2023-09-14 DIAGNOSIS — C320 Malignant neoplasm of glottis: Secondary | ICD-10-CM | POA: Diagnosis not present

## 2023-09-14 DIAGNOSIS — D6869 Other thrombophilia: Secondary | ICD-10-CM | POA: Diagnosis not present

## 2023-09-14 DIAGNOSIS — Z510 Encounter for antineoplastic radiation therapy: Secondary | ICD-10-CM | POA: Diagnosis not present

## 2023-09-14 DIAGNOSIS — I4891 Unspecified atrial fibrillation: Secondary | ICD-10-CM | POA: Diagnosis not present

## 2023-09-14 DIAGNOSIS — M8000XA Age-related osteoporosis with current pathological fracture, unspecified site, initial encounter for fracture: Secondary | ICD-10-CM | POA: Diagnosis not present

## 2023-09-14 DIAGNOSIS — R809 Proteinuria, unspecified: Secondary | ICD-10-CM | POA: Diagnosis not present

## 2023-09-14 DIAGNOSIS — Z23 Encounter for immunization: Secondary | ICD-10-CM | POA: Diagnosis not present

## 2023-09-14 DIAGNOSIS — I2510 Atherosclerotic heart disease of native coronary artery without angina pectoris: Secondary | ICD-10-CM | POA: Diagnosis not present

## 2023-09-14 DIAGNOSIS — I119 Hypertensive heart disease without heart failure: Secondary | ICD-10-CM | POA: Diagnosis not present

## 2023-09-14 DIAGNOSIS — D509 Iron deficiency anemia, unspecified: Secondary | ICD-10-CM | POA: Diagnosis not present

## 2023-09-14 DIAGNOSIS — H9193 Unspecified hearing loss, bilateral: Secondary | ICD-10-CM | POA: Diagnosis not present

## 2023-09-15 DIAGNOSIS — C329 Malignant neoplasm of larynx, unspecified: Secondary | ICD-10-CM | POA: Diagnosis not present

## 2023-09-15 DIAGNOSIS — G893 Neoplasm related pain (acute) (chronic): Secondary | ICD-10-CM | POA: Diagnosis not present

## 2023-09-15 DIAGNOSIS — C321 Malignant neoplasm of supraglottis: Secondary | ICD-10-CM | POA: Diagnosis not present

## 2023-09-15 DIAGNOSIS — Z510 Encounter for antineoplastic radiation therapy: Secondary | ICD-10-CM | POA: Diagnosis not present

## 2023-09-16 DIAGNOSIS — Z510 Encounter for antineoplastic radiation therapy: Secondary | ICD-10-CM | POA: Diagnosis not present

## 2023-09-16 DIAGNOSIS — G893 Neoplasm related pain (acute) (chronic): Secondary | ICD-10-CM | POA: Diagnosis not present

## 2023-09-16 DIAGNOSIS — C321 Malignant neoplasm of supraglottis: Secondary | ICD-10-CM | POA: Diagnosis not present

## 2023-09-17 DIAGNOSIS — Z510 Encounter for antineoplastic radiation therapy: Secondary | ICD-10-CM | POA: Diagnosis not present

## 2023-09-17 DIAGNOSIS — G893 Neoplasm related pain (acute) (chronic): Secondary | ICD-10-CM | POA: Diagnosis not present

## 2023-09-17 DIAGNOSIS — C329 Malignant neoplasm of larynx, unspecified: Secondary | ICD-10-CM | POA: Diagnosis not present

## 2023-09-17 DIAGNOSIS — C321 Malignant neoplasm of supraglottis: Secondary | ICD-10-CM | POA: Diagnosis not present

## 2023-09-20 DIAGNOSIS — C329 Malignant neoplasm of larynx, unspecified: Secondary | ICD-10-CM | POA: Diagnosis not present

## 2023-09-20 DIAGNOSIS — C321 Malignant neoplasm of supraglottis: Secondary | ICD-10-CM | POA: Diagnosis not present

## 2023-09-20 DIAGNOSIS — G893 Neoplasm related pain (acute) (chronic): Secondary | ICD-10-CM | POA: Diagnosis not present

## 2023-09-20 DIAGNOSIS — Z510 Encounter for antineoplastic radiation therapy: Secondary | ICD-10-CM | POA: Diagnosis not present

## 2023-09-20 DIAGNOSIS — R1312 Dysphagia, oropharyngeal phase: Secondary | ICD-10-CM | POA: Diagnosis not present

## 2023-09-21 DIAGNOSIS — C321 Malignant neoplasm of supraglottis: Secondary | ICD-10-CM | POA: Diagnosis not present

## 2023-09-21 DIAGNOSIS — Z510 Encounter for antineoplastic radiation therapy: Secondary | ICD-10-CM | POA: Diagnosis not present

## 2023-09-21 DIAGNOSIS — G893 Neoplasm related pain (acute) (chronic): Secondary | ICD-10-CM | POA: Diagnosis not present

## 2023-09-22 DIAGNOSIS — C321 Malignant neoplasm of supraglottis: Secondary | ICD-10-CM | POA: Diagnosis not present

## 2023-09-22 DIAGNOSIS — G893 Neoplasm related pain (acute) (chronic): Secondary | ICD-10-CM | POA: Diagnosis not present

## 2023-09-22 DIAGNOSIS — C329 Malignant neoplasm of larynx, unspecified: Secondary | ICD-10-CM | POA: Diagnosis not present

## 2023-09-22 DIAGNOSIS — Z510 Encounter for antineoplastic radiation therapy: Secondary | ICD-10-CM | POA: Diagnosis not present

## 2023-10-18 DIAGNOSIS — Z510 Encounter for antineoplastic radiation therapy: Secondary | ICD-10-CM | POA: Diagnosis not present

## 2023-10-18 DIAGNOSIS — R1312 Dysphagia, oropharyngeal phase: Secondary | ICD-10-CM | POA: Diagnosis not present

## 2023-10-18 DIAGNOSIS — J387 Other diseases of larynx: Secondary | ICD-10-CM | POA: Diagnosis not present

## 2023-10-18 DIAGNOSIS — C329 Malignant neoplasm of larynx, unspecified: Secondary | ICD-10-CM | POA: Diagnosis not present

## 2023-10-18 DIAGNOSIS — C321 Malignant neoplasm of supraglottis: Secondary | ICD-10-CM | POA: Diagnosis not present

## 2023-10-21 DIAGNOSIS — C321 Malignant neoplasm of supraglottis: Secondary | ICD-10-CM | POA: Diagnosis not present

## 2024-02-14 DIAGNOSIS — Z08 Encounter for follow-up examination after completed treatment for malignant neoplasm: Secondary | ICD-10-CM | POA: Diagnosis not present

## 2024-02-14 DIAGNOSIS — J387 Other diseases of larynx: Secondary | ICD-10-CM | POA: Diagnosis not present

## 2024-02-14 DIAGNOSIS — C329 Malignant neoplasm of larynx, unspecified: Secondary | ICD-10-CM | POA: Diagnosis not present

## 2024-02-14 DIAGNOSIS — R490 Dysphonia: Secondary | ICD-10-CM | POA: Diagnosis not present

## 2024-02-14 DIAGNOSIS — R1312 Dysphagia, oropharyngeal phase: Secondary | ICD-10-CM | POA: Diagnosis not present

## 2024-02-14 DIAGNOSIS — Z8521 Personal history of malignant neoplasm of larynx: Secondary | ICD-10-CM | POA: Diagnosis not present

## 2024-02-14 DIAGNOSIS — Z923 Personal history of irradiation: Secondary | ICD-10-CM | POA: Diagnosis not present

## 2024-02-14 DIAGNOSIS — R1313 Dysphagia, pharyngeal phase: Secondary | ICD-10-CM | POA: Diagnosis not present

## 2024-02-14 DIAGNOSIS — Z510 Encounter for antineoplastic radiation therapy: Secondary | ICD-10-CM | POA: Diagnosis not present

## 2024-02-14 DIAGNOSIS — C321 Malignant neoplasm of supraglottis: Secondary | ICD-10-CM | POA: Diagnosis not present

## 2024-03-13 DIAGNOSIS — D509 Iron deficiency anemia, unspecified: Secondary | ICD-10-CM | POA: Diagnosis not present

## 2024-03-13 DIAGNOSIS — I2510 Atherosclerotic heart disease of native coronary artery without angina pectoris: Secondary | ICD-10-CM | POA: Diagnosis not present

## 2024-03-13 DIAGNOSIS — C320 Malignant neoplasm of glottis: Secondary | ICD-10-CM | POA: Diagnosis not present

## 2024-03-13 DIAGNOSIS — I119 Hypertensive heart disease without heart failure: Secondary | ICD-10-CM | POA: Diagnosis not present

## 2024-03-13 DIAGNOSIS — S41112A Laceration without foreign body of left upper arm, initial encounter: Secondary | ICD-10-CM | POA: Diagnosis not present

## 2024-03-13 DIAGNOSIS — R270 Ataxia, unspecified: Secondary | ICD-10-CM | POA: Diagnosis not present

## 2024-03-13 DIAGNOSIS — M810 Age-related osteoporosis without current pathological fracture: Secondary | ICD-10-CM | POA: Diagnosis not present

## 2024-03-13 DIAGNOSIS — N400 Enlarged prostate without lower urinary tract symptoms: Secondary | ICD-10-CM | POA: Diagnosis not present

## 2024-03-13 DIAGNOSIS — E782 Mixed hyperlipidemia: Secondary | ICD-10-CM | POA: Diagnosis not present

## 2024-03-13 DIAGNOSIS — N182 Chronic kidney disease, stage 2 (mild): Secondary | ICD-10-CM | POA: Diagnosis not present

## 2024-03-13 DIAGNOSIS — I4891 Unspecified atrial fibrillation: Secondary | ICD-10-CM | POA: Diagnosis not present

## 2024-03-14 DIAGNOSIS — Z791 Long term (current) use of non-steroidal anti-inflammatories (NSAID): Secondary | ICD-10-CM | POA: Diagnosis not present

## 2024-03-14 DIAGNOSIS — I1310 Hypertensive heart and chronic kidney disease without heart failure, with stage 1 through stage 4 chronic kidney disease, or unspecified chronic kidney disease: Secondary | ICD-10-CM | POA: Diagnosis not present

## 2024-03-14 DIAGNOSIS — E782 Mixed hyperlipidemia: Secondary | ICD-10-CM | POA: Diagnosis not present

## 2024-03-14 DIAGNOSIS — Z7901 Long term (current) use of anticoagulants: Secondary | ICD-10-CM | POA: Diagnosis not present

## 2024-03-14 DIAGNOSIS — I4891 Unspecified atrial fibrillation: Secondary | ICD-10-CM | POA: Diagnosis not present

## 2024-03-14 DIAGNOSIS — Z9181 History of falling: Secondary | ICD-10-CM | POA: Diagnosis not present

## 2024-03-14 DIAGNOSIS — Z87891 Personal history of nicotine dependence: Secondary | ICD-10-CM | POA: Diagnosis not present

## 2024-03-14 DIAGNOSIS — N182 Chronic kidney disease, stage 2 (mild): Secondary | ICD-10-CM | POA: Diagnosis not present

## 2024-03-14 DIAGNOSIS — D6869 Other thrombophilia: Secondary | ICD-10-CM | POA: Diagnosis not present

## 2024-03-14 DIAGNOSIS — Z951 Presence of aortocoronary bypass graft: Secondary | ICD-10-CM | POA: Diagnosis not present

## 2024-03-14 DIAGNOSIS — C320 Malignant neoplasm of glottis: Secondary | ICD-10-CM | POA: Diagnosis not present

## 2024-03-14 DIAGNOSIS — N400 Enlarged prostate without lower urinary tract symptoms: Secondary | ICD-10-CM | POA: Diagnosis not present

## 2024-03-14 DIAGNOSIS — D509 Iron deficiency anemia, unspecified: Secondary | ICD-10-CM | POA: Diagnosis not present

## 2024-03-14 DIAGNOSIS — I2510 Atherosclerotic heart disease of native coronary artery without angina pectoris: Secondary | ICD-10-CM | POA: Diagnosis not present

## 2024-03-14 DIAGNOSIS — D692 Other nonthrombocytopenic purpura: Secondary | ICD-10-CM | POA: Diagnosis not present

## 2024-03-14 DIAGNOSIS — S41112D Laceration without foreign body of left upper arm, subsequent encounter: Secondary | ICD-10-CM | POA: Diagnosis not present

## 2024-03-14 DIAGNOSIS — C322 Malignant neoplasm of subglottis: Secondary | ICD-10-CM | POA: Diagnosis not present

## 2024-03-14 DIAGNOSIS — M810 Age-related osteoporosis without current pathological fracture: Secondary | ICD-10-CM | POA: Diagnosis not present

## 2024-03-16 DIAGNOSIS — D692 Other nonthrombocytopenic purpura: Secondary | ICD-10-CM | POA: Diagnosis not present

## 2024-03-16 DIAGNOSIS — S41112D Laceration without foreign body of left upper arm, subsequent encounter: Secondary | ICD-10-CM | POA: Diagnosis not present

## 2024-03-16 DIAGNOSIS — D6869 Other thrombophilia: Secondary | ICD-10-CM | POA: Diagnosis not present

## 2024-03-16 DIAGNOSIS — C320 Malignant neoplasm of glottis: Secondary | ICD-10-CM | POA: Diagnosis not present

## 2024-03-16 DIAGNOSIS — I2510 Atherosclerotic heart disease of native coronary artery without angina pectoris: Secondary | ICD-10-CM | POA: Diagnosis not present

## 2024-03-16 DIAGNOSIS — I4891 Unspecified atrial fibrillation: Secondary | ICD-10-CM | POA: Diagnosis not present

## 2024-03-20 DIAGNOSIS — I4891 Unspecified atrial fibrillation: Secondary | ICD-10-CM | POA: Diagnosis not present

## 2024-03-20 DIAGNOSIS — I2510 Atherosclerotic heart disease of native coronary artery without angina pectoris: Secondary | ICD-10-CM | POA: Diagnosis not present

## 2024-03-20 DIAGNOSIS — C320 Malignant neoplasm of glottis: Secondary | ICD-10-CM | POA: Diagnosis not present

## 2024-03-20 DIAGNOSIS — D6869 Other thrombophilia: Secondary | ICD-10-CM | POA: Diagnosis not present

## 2024-03-20 DIAGNOSIS — D692 Other nonthrombocytopenic purpura: Secondary | ICD-10-CM | POA: Diagnosis not present

## 2024-03-20 DIAGNOSIS — S41112D Laceration without foreign body of left upper arm, subsequent encounter: Secondary | ICD-10-CM | POA: Diagnosis not present

## 2024-03-21 DIAGNOSIS — I2510 Atherosclerotic heart disease of native coronary artery without angina pectoris: Secondary | ICD-10-CM | POA: Diagnosis not present

## 2024-03-21 DIAGNOSIS — S41112D Laceration without foreign body of left upper arm, subsequent encounter: Secondary | ICD-10-CM | POA: Diagnosis not present

## 2024-03-21 DIAGNOSIS — C320 Malignant neoplasm of glottis: Secondary | ICD-10-CM | POA: Diagnosis not present

## 2024-03-21 DIAGNOSIS — I4891 Unspecified atrial fibrillation: Secondary | ICD-10-CM | POA: Diagnosis not present

## 2024-03-21 DIAGNOSIS — D6869 Other thrombophilia: Secondary | ICD-10-CM | POA: Diagnosis not present

## 2024-03-21 DIAGNOSIS — D692 Other nonthrombocytopenic purpura: Secondary | ICD-10-CM | POA: Diagnosis not present

## 2024-03-23 DIAGNOSIS — I4891 Unspecified atrial fibrillation: Secondary | ICD-10-CM | POA: Diagnosis not present

## 2024-03-23 DIAGNOSIS — D692 Other nonthrombocytopenic purpura: Secondary | ICD-10-CM | POA: Diagnosis not present

## 2024-03-23 DIAGNOSIS — C320 Malignant neoplasm of glottis: Secondary | ICD-10-CM | POA: Diagnosis not present

## 2024-03-23 DIAGNOSIS — D6869 Other thrombophilia: Secondary | ICD-10-CM | POA: Diagnosis not present

## 2024-03-23 DIAGNOSIS — I2510 Atherosclerotic heart disease of native coronary artery without angina pectoris: Secondary | ICD-10-CM | POA: Diagnosis not present

## 2024-03-23 DIAGNOSIS — S41112D Laceration without foreign body of left upper arm, subsequent encounter: Secondary | ICD-10-CM | POA: Diagnosis not present

## 2024-03-27 DIAGNOSIS — I2510 Atherosclerotic heart disease of native coronary artery without angina pectoris: Secondary | ICD-10-CM | POA: Diagnosis not present

## 2024-03-27 DIAGNOSIS — S41112D Laceration without foreign body of left upper arm, subsequent encounter: Secondary | ICD-10-CM | POA: Diagnosis not present

## 2024-03-27 DIAGNOSIS — D692 Other nonthrombocytopenic purpura: Secondary | ICD-10-CM | POA: Diagnosis not present

## 2024-03-27 DIAGNOSIS — C320 Malignant neoplasm of glottis: Secondary | ICD-10-CM | POA: Diagnosis not present

## 2024-03-27 DIAGNOSIS — D6869 Other thrombophilia: Secondary | ICD-10-CM | POA: Diagnosis not present

## 2024-03-27 DIAGNOSIS — I4891 Unspecified atrial fibrillation: Secondary | ICD-10-CM | POA: Diagnosis not present

## 2024-03-30 DIAGNOSIS — C320 Malignant neoplasm of glottis: Secondary | ICD-10-CM | POA: Diagnosis not present

## 2024-03-30 DIAGNOSIS — D692 Other nonthrombocytopenic purpura: Secondary | ICD-10-CM | POA: Diagnosis not present

## 2024-03-30 DIAGNOSIS — I2510 Atherosclerotic heart disease of native coronary artery without angina pectoris: Secondary | ICD-10-CM | POA: Diagnosis not present

## 2024-03-30 DIAGNOSIS — D6869 Other thrombophilia: Secondary | ICD-10-CM | POA: Diagnosis not present

## 2024-03-30 DIAGNOSIS — S41112D Laceration without foreign body of left upper arm, subsequent encounter: Secondary | ICD-10-CM | POA: Diagnosis not present

## 2024-03-30 DIAGNOSIS — I4891 Unspecified atrial fibrillation: Secondary | ICD-10-CM | POA: Diagnosis not present

## 2024-04-04 DIAGNOSIS — D6869 Other thrombophilia: Secondary | ICD-10-CM | POA: Diagnosis not present

## 2024-04-04 DIAGNOSIS — C320 Malignant neoplasm of glottis: Secondary | ICD-10-CM | POA: Diagnosis not present

## 2024-04-04 DIAGNOSIS — I4891 Unspecified atrial fibrillation: Secondary | ICD-10-CM | POA: Diagnosis not present

## 2024-04-04 DIAGNOSIS — S41112D Laceration without foreign body of left upper arm, subsequent encounter: Secondary | ICD-10-CM | POA: Diagnosis not present

## 2024-04-04 DIAGNOSIS — D692 Other nonthrombocytopenic purpura: Secondary | ICD-10-CM | POA: Diagnosis not present

## 2024-04-04 DIAGNOSIS — I2510 Atherosclerotic heart disease of native coronary artery without angina pectoris: Secondary | ICD-10-CM | POA: Diagnosis not present

## 2024-04-06 DIAGNOSIS — I4891 Unspecified atrial fibrillation: Secondary | ICD-10-CM | POA: Diagnosis not present

## 2024-04-06 DIAGNOSIS — C320 Malignant neoplasm of glottis: Secondary | ICD-10-CM | POA: Diagnosis not present

## 2024-04-06 DIAGNOSIS — I2510 Atherosclerotic heart disease of native coronary artery without angina pectoris: Secondary | ICD-10-CM | POA: Diagnosis not present

## 2024-04-06 DIAGNOSIS — D692 Other nonthrombocytopenic purpura: Secondary | ICD-10-CM | POA: Diagnosis not present

## 2024-04-06 DIAGNOSIS — D6869 Other thrombophilia: Secondary | ICD-10-CM | POA: Diagnosis not present

## 2024-04-06 DIAGNOSIS — S41112D Laceration without foreign body of left upper arm, subsequent encounter: Secondary | ICD-10-CM | POA: Diagnosis not present

## 2024-04-13 DIAGNOSIS — N182 Chronic kidney disease, stage 2 (mild): Secondary | ICD-10-CM | POA: Diagnosis not present

## 2024-04-13 DIAGNOSIS — Z791 Long term (current) use of non-steroidal anti-inflammatories (NSAID): Secondary | ICD-10-CM | POA: Diagnosis not present

## 2024-04-13 DIAGNOSIS — D6869 Other thrombophilia: Secondary | ICD-10-CM | POA: Diagnosis not present

## 2024-04-13 DIAGNOSIS — Z951 Presence of aortocoronary bypass graft: Secondary | ICD-10-CM | POA: Diagnosis not present

## 2024-04-13 DIAGNOSIS — Z9181 History of falling: Secondary | ICD-10-CM | POA: Diagnosis not present

## 2024-04-13 DIAGNOSIS — Z7901 Long term (current) use of anticoagulants: Secondary | ICD-10-CM | POA: Diagnosis not present

## 2024-04-13 DIAGNOSIS — Z87891 Personal history of nicotine dependence: Secondary | ICD-10-CM | POA: Diagnosis not present

## 2024-04-13 DIAGNOSIS — I4891 Unspecified atrial fibrillation: Secondary | ICD-10-CM | POA: Diagnosis not present

## 2024-04-13 DIAGNOSIS — S41112D Laceration without foreign body of left upper arm, subsequent encounter: Secondary | ICD-10-CM | POA: Diagnosis not present

## 2024-04-13 DIAGNOSIS — C320 Malignant neoplasm of glottis: Secondary | ICD-10-CM | POA: Diagnosis not present

## 2024-04-13 DIAGNOSIS — D509 Iron deficiency anemia, unspecified: Secondary | ICD-10-CM | POA: Diagnosis not present

## 2024-04-13 DIAGNOSIS — N400 Enlarged prostate without lower urinary tract symptoms: Secondary | ICD-10-CM | POA: Diagnosis not present

## 2024-04-13 DIAGNOSIS — I2510 Atherosclerotic heart disease of native coronary artery without angina pectoris: Secondary | ICD-10-CM | POA: Diagnosis not present

## 2024-04-13 DIAGNOSIS — M810 Age-related osteoporosis without current pathological fracture: Secondary | ICD-10-CM | POA: Diagnosis not present

## 2024-04-13 DIAGNOSIS — D692 Other nonthrombocytopenic purpura: Secondary | ICD-10-CM | POA: Diagnosis not present

## 2024-04-13 DIAGNOSIS — E782 Mixed hyperlipidemia: Secondary | ICD-10-CM | POA: Diagnosis not present

## 2024-04-13 DIAGNOSIS — I1310 Hypertensive heart and chronic kidney disease without heart failure, with stage 1 through stage 4 chronic kidney disease, or unspecified chronic kidney disease: Secondary | ICD-10-CM | POA: Diagnosis not present

## 2024-04-18 DIAGNOSIS — I4891 Unspecified atrial fibrillation: Secondary | ICD-10-CM | POA: Diagnosis not present

## 2024-04-18 DIAGNOSIS — C320 Malignant neoplasm of glottis: Secondary | ICD-10-CM | POA: Diagnosis not present

## 2024-04-18 DIAGNOSIS — D692 Other nonthrombocytopenic purpura: Secondary | ICD-10-CM | POA: Diagnosis not present

## 2024-04-18 DIAGNOSIS — I2510 Atherosclerotic heart disease of native coronary artery without angina pectoris: Secondary | ICD-10-CM | POA: Diagnosis not present

## 2024-04-18 DIAGNOSIS — D6869 Other thrombophilia: Secondary | ICD-10-CM | POA: Diagnosis not present

## 2024-04-18 DIAGNOSIS — S41112D Laceration without foreign body of left upper arm, subsequent encounter: Secondary | ICD-10-CM | POA: Diagnosis not present

## 2024-04-24 DIAGNOSIS — C320 Malignant neoplasm of glottis: Secondary | ICD-10-CM | POA: Diagnosis not present

## 2024-04-24 DIAGNOSIS — D6869 Other thrombophilia: Secondary | ICD-10-CM | POA: Diagnosis not present

## 2024-04-24 DIAGNOSIS — I4891 Unspecified atrial fibrillation: Secondary | ICD-10-CM | POA: Diagnosis not present

## 2024-04-24 DIAGNOSIS — S41112D Laceration without foreign body of left upper arm, subsequent encounter: Secondary | ICD-10-CM | POA: Diagnosis not present

## 2024-04-24 DIAGNOSIS — D692 Other nonthrombocytopenic purpura: Secondary | ICD-10-CM | POA: Diagnosis not present

## 2024-04-24 DIAGNOSIS — I2510 Atherosclerotic heart disease of native coronary artery without angina pectoris: Secondary | ICD-10-CM | POA: Diagnosis not present

## 2024-05-01 DIAGNOSIS — I2510 Atherosclerotic heart disease of native coronary artery without angina pectoris: Secondary | ICD-10-CM | POA: Diagnosis not present

## 2024-05-01 DIAGNOSIS — I4891 Unspecified atrial fibrillation: Secondary | ICD-10-CM | POA: Diagnosis not present

## 2024-05-01 DIAGNOSIS — D692 Other nonthrombocytopenic purpura: Secondary | ICD-10-CM | POA: Diagnosis not present

## 2024-05-01 DIAGNOSIS — D6869 Other thrombophilia: Secondary | ICD-10-CM | POA: Diagnosis not present

## 2024-05-01 DIAGNOSIS — C320 Malignant neoplasm of glottis: Secondary | ICD-10-CM | POA: Diagnosis not present

## 2024-05-01 DIAGNOSIS — S41112D Laceration without foreign body of left upper arm, subsequent encounter: Secondary | ICD-10-CM | POA: Diagnosis not present

## 2024-05-09 DIAGNOSIS — C320 Malignant neoplasm of glottis: Secondary | ICD-10-CM | POA: Diagnosis not present

## 2024-05-09 DIAGNOSIS — I4891 Unspecified atrial fibrillation: Secondary | ICD-10-CM | POA: Diagnosis not present

## 2024-05-09 DIAGNOSIS — D6869 Other thrombophilia: Secondary | ICD-10-CM | POA: Diagnosis not present

## 2024-05-09 DIAGNOSIS — D692 Other nonthrombocytopenic purpura: Secondary | ICD-10-CM | POA: Diagnosis not present

## 2024-05-09 DIAGNOSIS — I2510 Atherosclerotic heart disease of native coronary artery without angina pectoris: Secondary | ICD-10-CM | POA: Diagnosis not present

## 2024-05-09 DIAGNOSIS — S41112D Laceration without foreign body of left upper arm, subsequent encounter: Secondary | ICD-10-CM | POA: Diagnosis not present

## 2024-06-28 DIAGNOSIS — C329 Malignant neoplasm of larynx, unspecified: Secondary | ICD-10-CM | POA: Diagnosis not present

## 2024-06-28 DIAGNOSIS — R490 Dysphonia: Secondary | ICD-10-CM | POA: Diagnosis not present

## 2024-09-14 DIAGNOSIS — I119 Hypertensive heart disease without heart failure: Secondary | ICD-10-CM | POA: Diagnosis not present

## 2024-09-14 DIAGNOSIS — H9193 Unspecified hearing loss, bilateral: Secondary | ICD-10-CM | POA: Diagnosis not present

## 2024-09-14 DIAGNOSIS — I4891 Unspecified atrial fibrillation: Secondary | ICD-10-CM | POA: Diagnosis not present

## 2024-09-14 DIAGNOSIS — M8000XA Age-related osteoporosis with current pathological fracture, unspecified site, initial encounter for fracture: Secondary | ICD-10-CM | POA: Diagnosis not present

## 2024-09-14 DIAGNOSIS — D509 Iron deficiency anemia, unspecified: Secondary | ICD-10-CM | POA: Diagnosis not present

## 2024-09-14 DIAGNOSIS — E782 Mixed hyperlipidemia: Secondary | ICD-10-CM | POA: Diagnosis not present

## 2024-09-14 DIAGNOSIS — Z23 Encounter for immunization: Secondary | ICD-10-CM | POA: Diagnosis not present

## 2024-09-14 DIAGNOSIS — I2510 Atherosclerotic heart disease of native coronary artery without angina pectoris: Secondary | ICD-10-CM | POA: Diagnosis not present

## 2024-09-14 DIAGNOSIS — Z0000 Encounter for general adult medical examination without abnormal findings: Secondary | ICD-10-CM | POA: Diagnosis not present

## 2024-09-14 DIAGNOSIS — N182 Chronic kidney disease, stage 2 (mild): Secondary | ICD-10-CM | POA: Diagnosis not present

## 2024-09-14 DIAGNOSIS — Z8521 Personal history of malignant neoplasm of larynx: Secondary | ICD-10-CM | POA: Diagnosis not present

## 2024-09-14 DIAGNOSIS — Z1331 Encounter for screening for depression: Secondary | ICD-10-CM | POA: Diagnosis not present

## 2024-09-15 LAB — LAB REPORT - SCANNED
Creatinine, POC: 98 mg/dL
EGFR: 66
Microalb Creat Ratio: 238
Microalbumin, Urine: 23.23
